# Patient Record
Sex: Female | Born: 1970
Health system: Southern US, Community
[De-identification: ages and names within clinical notes are randomized; demographics above are authoritative.]

## PROBLEM LIST (undated history)

## (undated) DIAGNOSIS — L309 Dermatitis, unspecified: Secondary | ICD-10-CM

## (undated) DIAGNOSIS — Z9889 Other specified postprocedural states: Secondary | ICD-10-CM

## (undated) DIAGNOSIS — A499 Bacterial infection, unspecified: Secondary | ICD-10-CM

## (undated) DIAGNOSIS — K449 Diaphragmatic hernia without obstruction or gangrene: Secondary | ICD-10-CM

## (undated) DIAGNOSIS — N83209 Unspecified ovarian cyst, unspecified side: Secondary | ICD-10-CM

## (undated) DIAGNOSIS — K222 Esophageal obstruction: Secondary | ICD-10-CM

## (undated) DIAGNOSIS — D649 Anemia, unspecified: Secondary | ICD-10-CM

## (undated) DIAGNOSIS — K219 Gastro-esophageal reflux disease without esophagitis: Secondary | ICD-10-CM

## (undated) DIAGNOSIS — B379 Candidiasis, unspecified: Secondary | ICD-10-CM

## (undated) DIAGNOSIS — R112 Nausea with vomiting, unspecified: Secondary | ICD-10-CM

## (undated) DIAGNOSIS — Z8619 Personal history of other infectious and parasitic diseases: Secondary | ICD-10-CM

## (undated) DIAGNOSIS — L80 Vitiligo: Secondary | ICD-10-CM

## (undated) DIAGNOSIS — R011 Cardiac murmur, unspecified: Secondary | ICD-10-CM

## (undated) DIAGNOSIS — K220 Achalasia of cardia: Secondary | ICD-10-CM

## (undated) HISTORY — DX: Diaphragmatic hernia without obstruction or gangrene: K44.9

## (undated) HISTORY — PX: UPPER GASTROINTESTINAL ENDOSCOPY: SHX188

## (undated) HISTORY — DX: Vitiligo: L80

## (undated) HISTORY — PX: ABDOMINAL HYSTERECTOMY: SHX81

## (undated) HISTORY — DX: Gastro-esophageal reflux disease without esophagitis: K21.9

## (undated) HISTORY — PX: DILATION AND CURETTAGE OF UTERUS: SHX78

## (undated) HISTORY — DX: Achalasia of cardia: K22.0

## (undated) HISTORY — DX: Unspecified ovarian cyst, unspecified side: N83.209

## (undated) HISTORY — PX: BREAST LUMPECTOMY: SHX2

## (undated) HISTORY — DX: Personal history of other infectious and parasitic diseases: Z86.19

## (undated) HISTORY — PX: TONSILLECTOMY: SUR1361

## (undated) HISTORY — PX: TUBAL LIGATION: SHX77

## (undated) HISTORY — DX: Anemia, unspecified: D64.9

## (undated) HISTORY — DX: Candidiasis, unspecified: B37.9

## (undated) HISTORY — PX: UTERINE FIBROID SURGERY: SHX826

## (undated) HISTORY — DX: Bacterial infection, unspecified: A49.9

## (undated) HISTORY — DX: Esophageal obstruction: K22.2

---

## 1999-08-03 ENCOUNTER — Other Ambulatory Visit: Admission: RE | Admit: 1999-08-03 | Discharge: 1999-08-03 | Payer: Self-pay | Admitting: Obstetrics and Gynecology

## 1999-11-24 ENCOUNTER — Inpatient Hospital Stay (HOSPITAL_COMMUNITY): Admission: AD | Admit: 1999-11-24 | Discharge: 1999-11-24 | Payer: Self-pay | Admitting: Obstetrics

## 1999-12-09 ENCOUNTER — Inpatient Hospital Stay (HOSPITAL_COMMUNITY): Admission: AD | Admit: 1999-12-09 | Discharge: 1999-12-09 | Payer: Self-pay | Admitting: Obstetrics

## 2000-02-20 ENCOUNTER — Inpatient Hospital Stay (HOSPITAL_COMMUNITY): Admission: AD | Admit: 2000-02-20 | Discharge: 2000-02-20 | Payer: Self-pay | Admitting: Obstetrics

## 2003-11-24 ENCOUNTER — Ambulatory Visit (HOSPITAL_COMMUNITY): Admission: RE | Admit: 2003-11-24 | Discharge: 2003-11-24 | Payer: Self-pay | Admitting: Obstetrics and Gynecology

## 2003-11-28 ENCOUNTER — Encounter: Admission: RE | Admit: 2003-11-28 | Discharge: 2003-11-28 | Payer: Self-pay | Admitting: Obstetrics and Gynecology

## 2004-03-18 ENCOUNTER — Encounter: Admission: RE | Admit: 2004-03-18 | Discharge: 2004-03-18 | Payer: Self-pay | Admitting: Obstetrics and Gynecology

## 2004-03-26 ENCOUNTER — Encounter: Admission: RE | Admit: 2004-03-26 | Discharge: 2004-03-26 | Payer: Self-pay | Admitting: Unknown Physician Specialty

## 2004-05-20 ENCOUNTER — Ambulatory Visit: Payer: Self-pay | Admitting: Gastroenterology

## 2004-08-02 ENCOUNTER — Encounter: Admission: RE | Admit: 2004-08-02 | Discharge: 2004-08-02 | Payer: Self-pay | Admitting: Obstetrics and Gynecology

## 2004-09-28 ENCOUNTER — Ambulatory Visit: Payer: Self-pay | Admitting: Gastroenterology

## 2004-10-11 ENCOUNTER — Ambulatory Visit: Payer: Self-pay | Admitting: Gastroenterology

## 2004-11-25 ENCOUNTER — Ambulatory Visit: Payer: Self-pay | Admitting: Gastroenterology

## 2004-12-23 ENCOUNTER — Ambulatory Visit (HOSPITAL_COMMUNITY): Admission: RE | Admit: 2004-12-23 | Discharge: 2004-12-23 | Payer: Self-pay | Admitting: Gastroenterology

## 2004-12-24 ENCOUNTER — Ambulatory Visit: Payer: Self-pay | Admitting: Gastroenterology

## 2004-12-31 ENCOUNTER — Ambulatory Visit: Payer: Self-pay | Admitting: Gastroenterology

## 2005-04-20 ENCOUNTER — Emergency Department (HOSPITAL_COMMUNITY): Admission: EM | Admit: 2005-04-20 | Discharge: 2005-04-20 | Payer: Self-pay | Admitting: Family Medicine

## 2005-05-23 ENCOUNTER — Emergency Department (HOSPITAL_COMMUNITY): Admission: EM | Admit: 2005-05-23 | Discharge: 2005-05-24 | Payer: Self-pay | Admitting: Emergency Medicine

## 2005-05-31 IMAGING — RF DG ESOPHAGUS
13 series · 19 of 19 positions shown · non-contrast
Comparison: none

CLINICAL DATA: Dysphagia for two months.  The patient has difficulty with solids and liquids. 
 BARIUM ESOPHAGRAM 
 The patient ingested a 13 mm barium tablet.  It passed from the mouth to the gastroesophageal junction without delay, but then lodged at that site and did not pass for several minutes.  There are scattered areas of small erosions in the esophagus consistent with mild diffuse esophagitis.  The barium tablet finally passed with the patient performed a Valsalva maneuver.  I do not see a discrete hiatal hernia.  There was no spontaneous gastroesophageal reflux.  However, the patient did not have a good secondary stripping wave.  The oropharyngeal swallowing mechanisms were normal.  
 IMPRESSION
 1.  Scattered small esophageal erosions consistent with diffuse mild esophagitis.  Persistent spasm in the region of the gastroesophageal junction as indicated by the delayed passage of the 13 mm barium tablet.  However, it eventually did pass without dissolving when the patient performed the Valsalva maneuver. 
 2.  Poor secondary stripping wave.

[Series 1: run · 3 of 3 slices shown (1 of 13)]
[im 1/3]
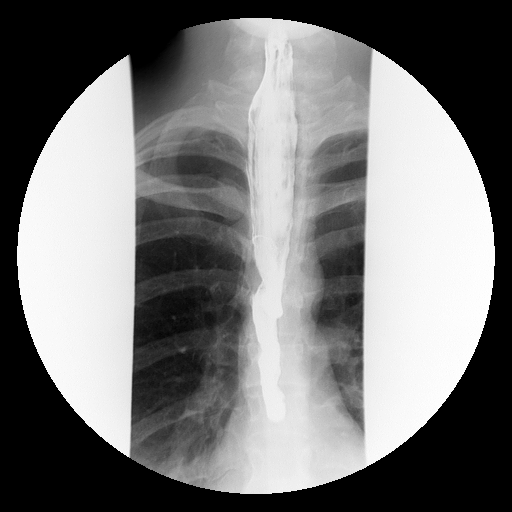
[im 2/3]
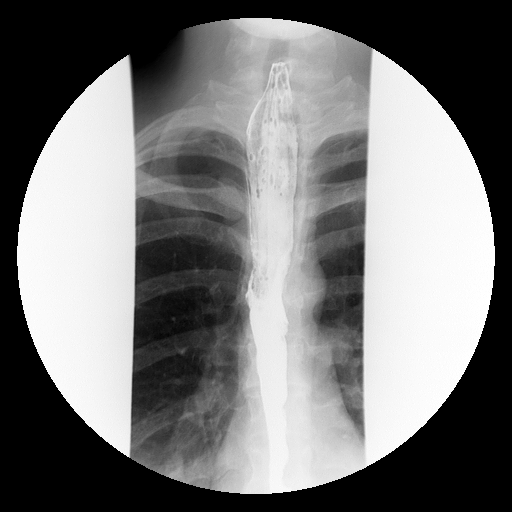
[im 3/3]
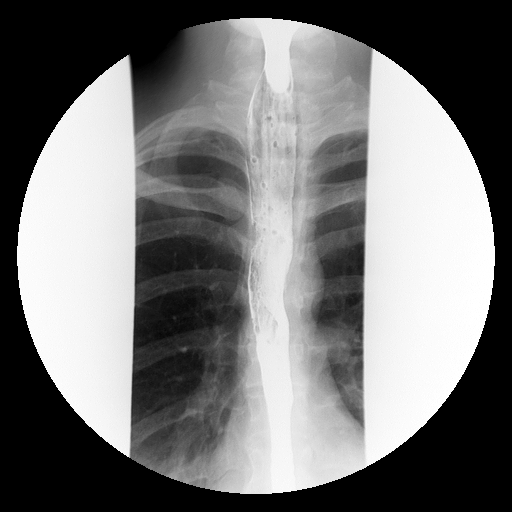

[Series 2: run · 2 of 2 slices shown (2 of 13)]
[im 1/2]
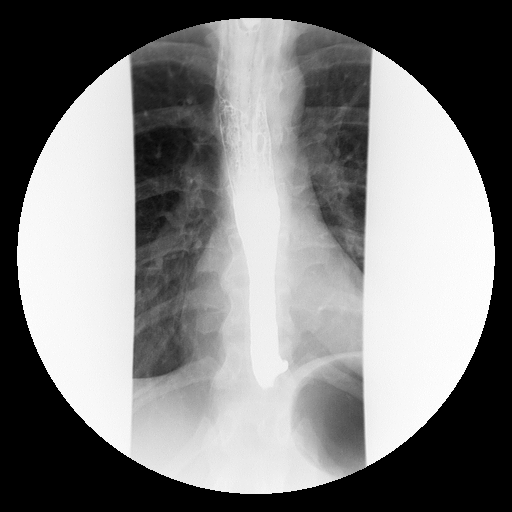
[im 2/2]
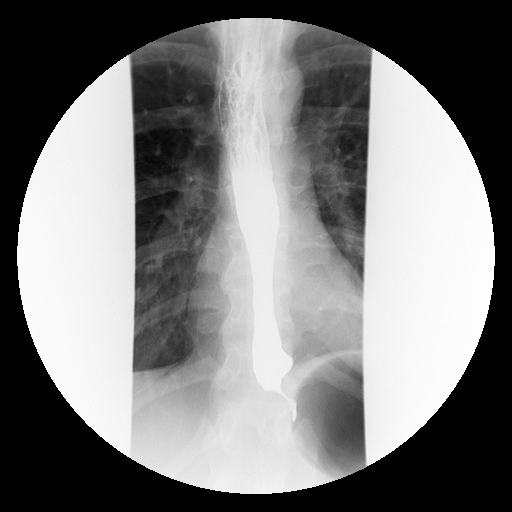

[Series 3: run · 2 of 2 slices shown (3 of 13)]
[im 1/2]
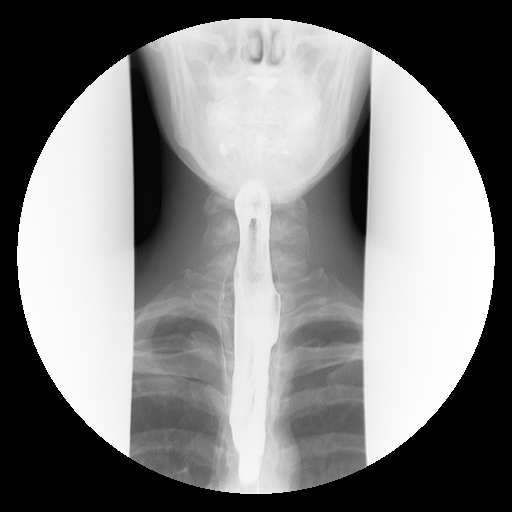
[im 2/2]
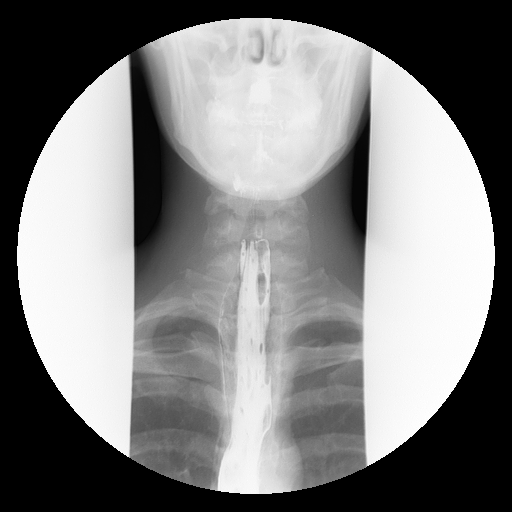

[Series 5: run · 1 of 1 slices shown (4 of 13)]
[im 1/1]
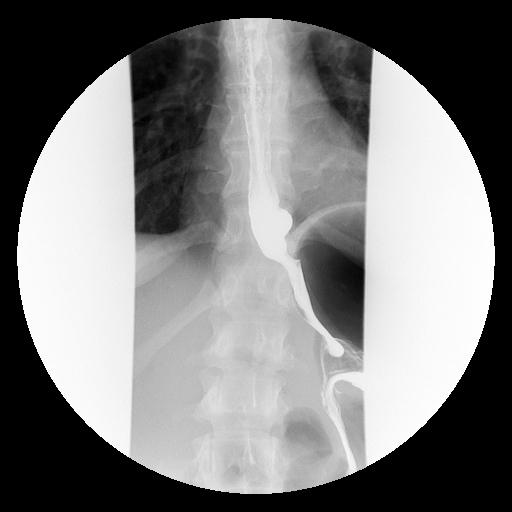

[Series 6: run · 3 of 3 slices shown (5 of 13)]
[im 1/3]
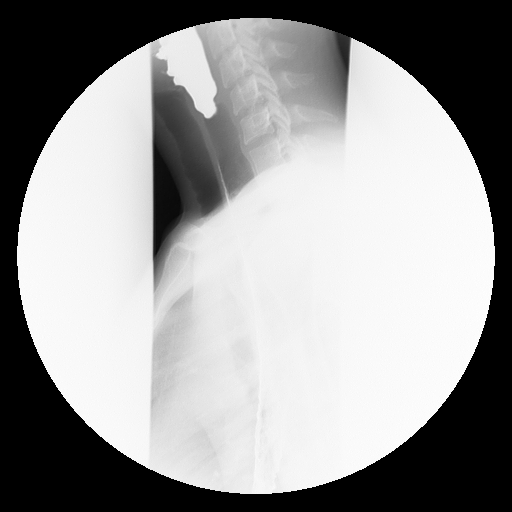
[im 2/3]
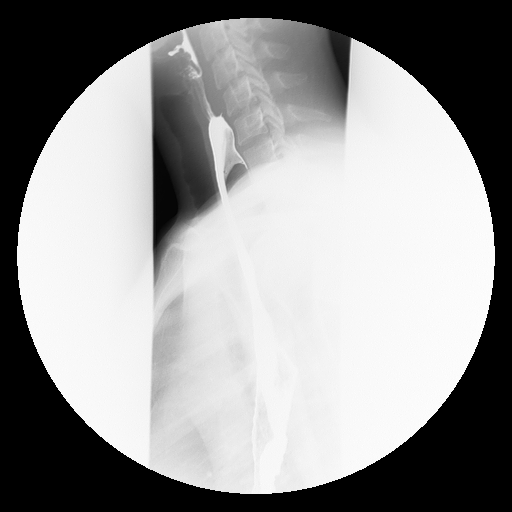
[im 3/3]
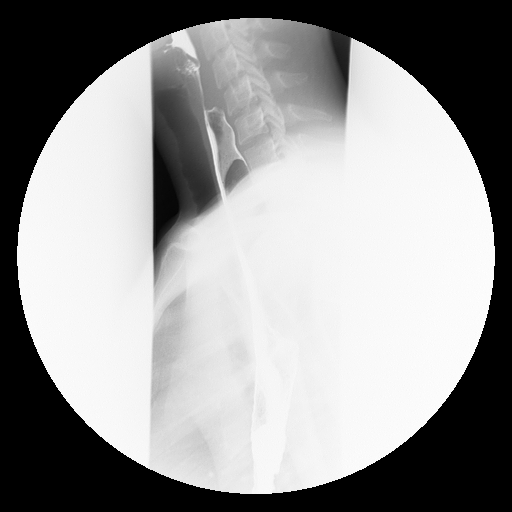

[Series 7: run · 1 of 1 slices shown (6 of 13)]
[im 1/1]
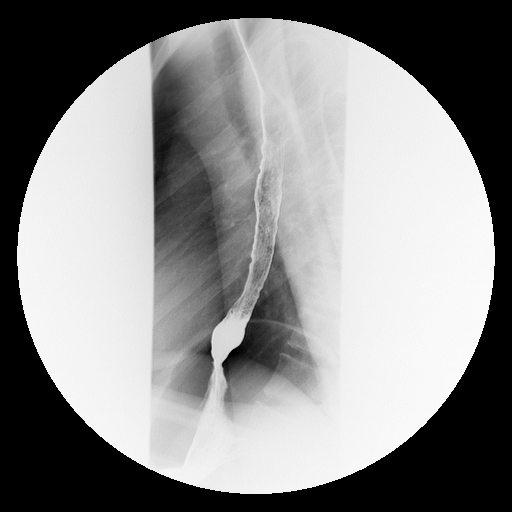

[Series 8: run · 1 of 1 slices shown (7 of 13)]
[im 1/1]
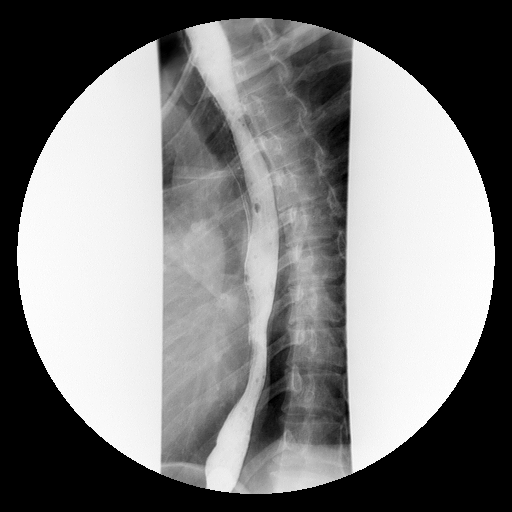

[Series 9: run · 1 of 1 slices shown (8 of 13)]
[im 1/1]
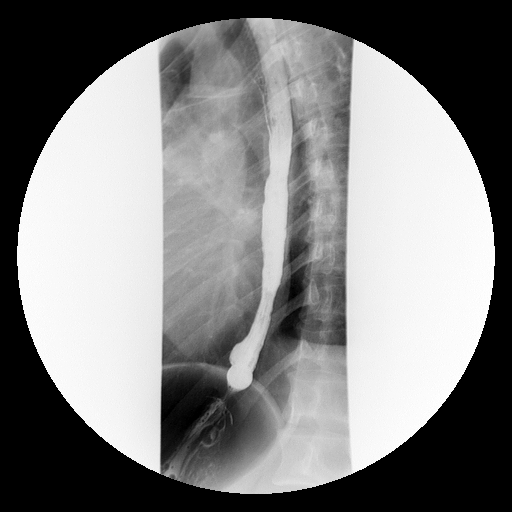

[Series 10: run · 1 of 1 slices shown (9 of 13)]
[im 1/1]
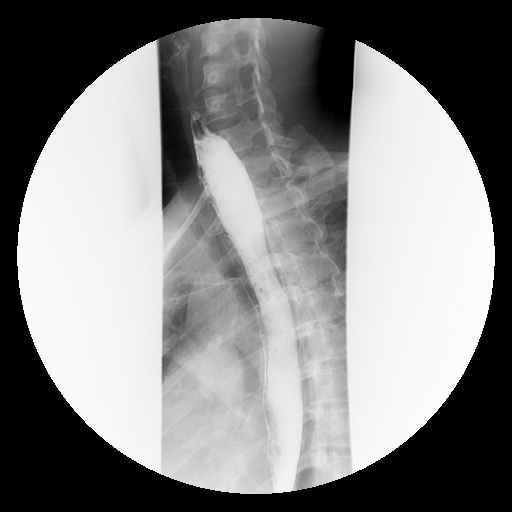

[Series 11: run · 1 of 1 slices shown (10 of 13)]
[im 1/1]
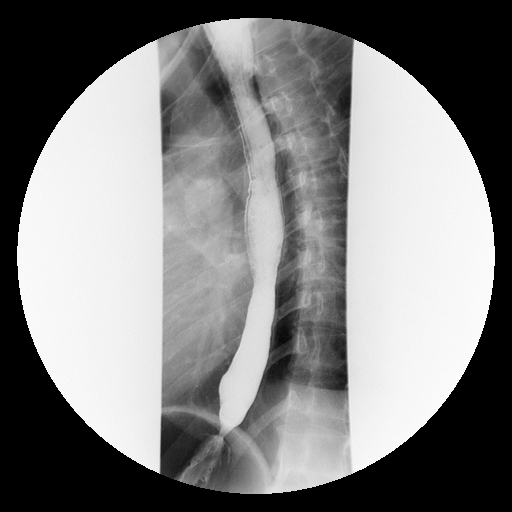

[Series 12: run · 1 of 1 slices shown (11 of 13)]
[im 1/1]
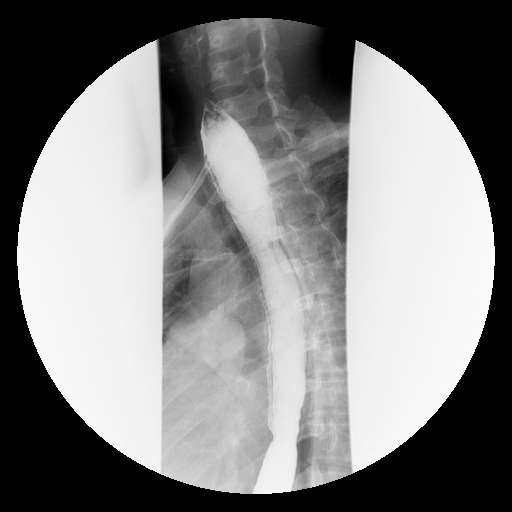

[Series 13: run · 1 of 1 slices shown (12 of 13)]
[im 1/1]
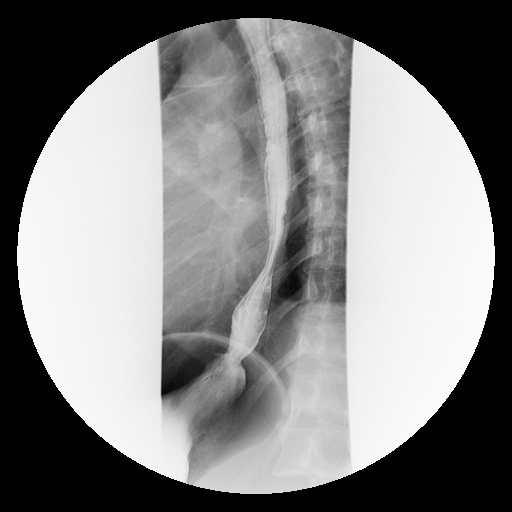

[Series 14: run · 1 of 1 slices shown (13 of 13)]
[im 1/1]
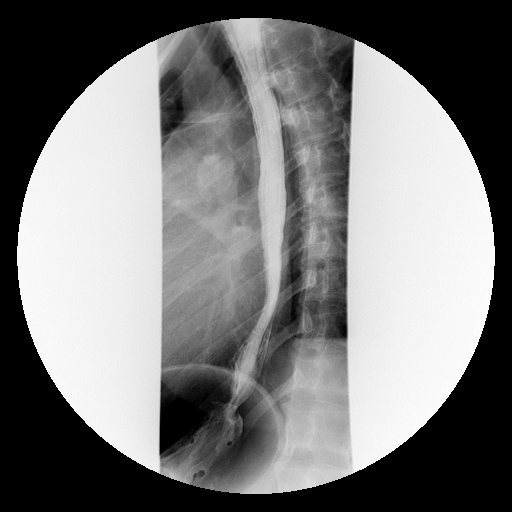

[19 of 19 positions shown; findings below may reference images not displayed]

## 2006-12-15 ENCOUNTER — Encounter: Admission: RE | Admit: 2006-12-15 | Discharge: 2006-12-15 | Payer: Self-pay | Admitting: Obstetrics and Gynecology

## 2007-03-19 ENCOUNTER — Emergency Department (HOSPITAL_COMMUNITY): Admission: EM | Admit: 2007-03-19 | Discharge: 2007-03-19 | Payer: Self-pay | Admitting: Emergency Medicine

## 2007-06-22 ENCOUNTER — Encounter: Admission: RE | Admit: 2007-06-22 | Discharge: 2007-06-22 | Payer: Self-pay | Admitting: Obstetrics and Gynecology

## 2008-04-24 ENCOUNTER — Emergency Department (HOSPITAL_COMMUNITY): Admission: EM | Admit: 2008-04-24 | Discharge: 2008-04-24 | Payer: Self-pay | Admitting: Family Medicine

## 2008-04-24 DIAGNOSIS — K222 Esophageal obstruction: Secondary | ICD-10-CM | POA: Insufficient documentation

## 2008-04-24 DIAGNOSIS — K219 Gastro-esophageal reflux disease without esophagitis: Secondary | ICD-10-CM

## 2008-04-24 DIAGNOSIS — R110 Nausea: Secondary | ICD-10-CM

## 2008-04-24 DIAGNOSIS — K5900 Constipation, unspecified: Secondary | ICD-10-CM | POA: Insufficient documentation

## 2008-04-29 ENCOUNTER — Ambulatory Visit: Payer: Self-pay | Admitting: Gastroenterology

## 2008-04-29 DIAGNOSIS — K220 Achalasia of cardia: Secondary | ICD-10-CM

## 2008-04-29 DIAGNOSIS — F411 Generalized anxiety disorder: Secondary | ICD-10-CM | POA: Insufficient documentation

## 2008-05-02 ENCOUNTER — Ambulatory Visit (HOSPITAL_COMMUNITY): Admission: RE | Admit: 2008-05-02 | Discharge: 2008-05-02 | Payer: Self-pay | Admitting: Gastroenterology

## 2008-05-06 ENCOUNTER — Telehealth: Payer: Self-pay | Admitting: Gastroenterology

## 2008-05-14 ENCOUNTER — Encounter (INDEPENDENT_AMBULATORY_CARE_PROVIDER_SITE_OTHER): Payer: Self-pay | Admitting: *Deleted

## 2009-07-30 ENCOUNTER — Emergency Department (HOSPITAL_COMMUNITY): Admission: EM | Admit: 2009-07-30 | Discharge: 2009-07-30 | Payer: Self-pay | Admitting: Family Medicine

## 2009-12-28 ENCOUNTER — Emergency Department (HOSPITAL_COMMUNITY): Admission: EM | Admit: 2009-12-28 | Discharge: 2009-12-28 | Payer: Self-pay | Admitting: Emergency Medicine

## 2010-07-20 ENCOUNTER — Encounter
Admission: RE | Admit: 2010-07-20 | Discharge: 2010-07-20 | Payer: Self-pay | Source: Home / Self Care | Attending: Family Medicine | Admitting: Family Medicine

## 2010-08-01 ENCOUNTER — Encounter: Payer: Self-pay | Admitting: Obstetrics and Gynecology

## 2010-09-26 LAB — POCT PREGNANCY, URINE: Preg Test, Ur: NEGATIVE

## 2010-09-26 LAB — URINALYSIS, ROUTINE W REFLEX MICROSCOPIC
Glucose, UA: NEGATIVE mg/dL
Hgb urine dipstick: NEGATIVE
Ketones, ur: NEGATIVE mg/dL
Protein, ur: NEGATIVE mg/dL
Urobilinogen, UA: 1 mg/dL (ref 0.0–1.0)

## 2010-09-26 LAB — WET PREP, GENITAL: WBC, Wet Prep HPF POC: NONE SEEN

## 2010-09-26 LAB — GC/CHLAMYDIA PROBE AMP, GENITAL: GC Probe Amp, Genital: NEGATIVE

## 2010-11-26 NOTE — Op Note (Signed)
Christy Collins, Christy Collins           ACCOUNT NO.:  [id number]   MEDICAL RECORD NO.:  [id number]          PATIENT TYPE:  AMB   LOCATION:  ENDO                         FACILITY:  [facility]   PHYSICIAN:  Vania Rea. Jarold Motto, M.D. [facility] OF BIRTH:  1971/04/25   DATE OF PROCEDURE:  12/31/2004  DATE OF DISCHARGE:  12/23/2004                                 OPERATIVE REPORT   PROCEDURE PERFORMED:  Esophageal manometry and 24-hour pH probe.   1.  Upper esophageal sphincter pressures appear normal.  2.  Lower esophageal sphincter pressure measures between 25 and 35 mmHg with      incomplete relaxation to swallow and with residual pressure at 12.5 mm.  3.  Motility pattern.  There were low amplitude, aperistaltic contractions      throughout the length of the esophagus noted.   ASSESSMENT:  This test is consistent with achalasia although on looking at  the manometry exam, I really do not have a good reading of the lower  esophageal sphincter entirely.   24-hour pH probe test:  The 24-hour pH probe test was completed on December 23, 2004.  Total Demeester score distally was 4.6, normal being less than 25.  There was no significant acid reflux distally or proximally in upright or  supine position.  This was a normal 24 hour pH probe test.   ASSESSMENT AND RECOMMENDATIONS:  This manometry exam shows probably  achalasia which would go along with this patient's barium swallow, failure  to respond to previous esophageal dilatations, and continued symptomatology.  I have referred her to Dr. Ninfa Linden at [facility] for second opinion  and consideration of surgical myotomy of her lower esophageal sphincter.        DRP/MEDQ  D:  12/31/2004  T:  12/31/2004  Job:  [id number]

## 2011-11-15 ENCOUNTER — Other Ambulatory Visit: Payer: Self-pay | Admitting: Family Medicine

## 2011-11-15 ENCOUNTER — Encounter: Payer: Self-pay | Admitting: Gastroenterology

## 2011-11-15 DIAGNOSIS — N6009 Solitary cyst of unspecified breast: Secondary | ICD-10-CM

## 2011-11-18 ENCOUNTER — Ambulatory Visit
Admission: RE | Admit: 2011-11-18 | Discharge: 2011-11-18 | Disposition: A | Discharge status: Home / Self Care | Payer: BC Managed Care – PPO | Source: Ambulatory Visit | Attending: Family Medicine | Admitting: Family Medicine

## 2011-11-18 DIAGNOSIS — N6009 Solitary cyst of unspecified breast: Secondary | ICD-10-CM

## 2011-12-07 ENCOUNTER — Encounter: Payer: Self-pay | Admitting: *Deleted

## 2011-12-08 ENCOUNTER — Ambulatory Visit: Payer: Self-pay | Admitting: Gastroenterology

## 2011-12-14 ENCOUNTER — Telehealth: Payer: Self-pay | Admitting: Gastroenterology

## 2011-12-14 NOTE — Telephone Encounter (Signed)
Message copied by Arna Snipe on Wed Dec 14, 2011  9:31 AM ------      Message from: Harlow Mares D      Created: Thu Dec 08, 2011 10:56 AM       Please bill patient for no show 12/08/2011

## 2012-03-28 ENCOUNTER — Emergency Department (INDEPENDENT_AMBULATORY_CARE_PROVIDER_SITE_OTHER): Payer: BC Managed Care – PPO

## 2012-03-28 ENCOUNTER — Encounter (HOSPITAL_COMMUNITY): Payer: Self-pay

## 2012-03-28 ENCOUNTER — Emergency Department (HOSPITAL_COMMUNITY)
Admission: EM | Admit: 2012-03-28 | Discharge: 2012-03-28 | Disposition: A | Discharge status: Home / Self Care | Payer: BC Managed Care – PPO | Source: Home / Self Care | Attending: Emergency Medicine | Admitting: Emergency Medicine

## 2012-03-28 DIAGNOSIS — R071 Chest pain on breathing: Secondary | ICD-10-CM

## 2012-03-28 DIAGNOSIS — R0789 Other chest pain: Secondary | ICD-10-CM

## 2012-03-28 MED ORDER — HYDROCODONE-ACETAMINOPHEN 5-500 MG PO TABS: 1.0000 | ORAL_TABLET | Freq: Four times a day (QID) | ORAL | Status: DC | PRN

## 2012-03-28 MED ORDER — MELOXICAM 7.5 MG PO TABS: 7.5000 mg | ORAL_TABLET | Freq: Every day | ORAL | Status: DC

## 2012-03-28 NOTE — ED Provider Notes (Signed)
History     CSN: [id number]  Arrival date & time 03/28/12  1843   First MD Initiated Contact with Patient 03/28/12 1850      Chief Complaint  Patient presents with  . Muscle Pain    (Consider location/radiation/quality/duration/timing/severity/associated sxs/prior treatment) HPI Comments: Patient presents urgent care complaining of anterior chest wall pain, increases with inhalation and direct palpation and movement. She describes that  she dropped a box on herself at work about 4 days ago. Has been sore since then but the pain has been getting progressively worse. He denies any cough, shortness of breath, nausea vomiting or abdominal pain.  Patient is a 41 y.o. female presenting with musculoskeletal pain. The history is provided by the patient.  Muscle Pain This is a new problem. The problem occurs constantly. The problem has not changed since onset.Associated symptoms include chest pain. Pertinent negatives include no abdominal pain, no headaches and no shortness of breath. The symptoms are aggravated by bending (Breathing). The symptoms are relieved by rest. She has tried nothing for the symptoms. The treatment provided no relief.    Past Medical History  Diagnosis Date  . Esophageal reflux   . Stricture and stenosis of esophagus   . Achalasia   . Constipation   . Noncompliance     Past Surgical History  Procedure Date  . Tonsillectomy   . Tubal ligation     No family history on file.  History  Substance Use Topics  . Smoking status: Current Every Day Smoker  . Smokeless tobacco: Not on file  . Alcohol Use: Yes    OB History    Grav Para Term Preterm Abortions TAB SAB Ect Mult Living                  Review of Systems  Constitutional: Positive for activity change. Negative for fever, chills, diaphoresis, appetite change and fatigue.  Respiratory: Negative for apnea, cough, choking, chest tightness, shortness of breath and wheezing.   Cardiovascular: Positive  for chest pain. Negative for palpitations and leg swelling.  Gastrointestinal: Negative for abdominal pain.  Musculoskeletal: Negative for back pain.  Neurological: Negative for dizziness and headaches.    Allergies  Review of patient's allergies indicates no known allergies.  Home Medications  No current outpatient prescriptions on file.  BP 127/74  Pulse 83  Temp 98.7 F (37.1 C) (Oral)  Resp 18  SpO2 100%  LMP 03/18/2012  Physical Exam  Constitutional: Vital signs are normal. She appears well-developed and well-nourished.  Non-toxic appearance. She does not have a sickly appearance. She does not appear ill. No distress. She is not intubated.  HENT:  Head: Normocephalic.  Neck: Neck supple.  Cardiovascular: Exam reveals no gallop and no friction rub.   No murmur heard. Pulmonary/Chest: Effort normal and breath sounds normal. No accessory muscle usage. No apnea. She is not intubated. No respiratory distress. She has no wheezes. She has no rales. Chest wall is not dull to percussion. She exhibits tenderness and bony tenderness. She exhibits no mass, no laceration, no crepitus, no edema, no deformity, no swelling and no retraction.    Abdominal: There is no tenderness. There is no rebound.  Skin: No rash noted. No erythema.    ED Course  Procedures (including critical care time)  Labs Reviewed - No data to display No results found.   No diagnosis found.  EKG normal sinus rhythm ventricular rate of 69 beats per minute normal PR interval and QRS duration. No  signs of ischemia acute or chronic.   MDM   muscle skeletal chest wall pain most likely diagnoses were performing a chest x-ray as patient is reporting that she had had some type of sternal injury in the past         Jimmie Molly, MD 03/28/12 1931

## 2012-03-28 NOTE — ED Notes (Signed)
C/o pain in chest area after dropping boxes on herself at work 4 days ago

## 2012-04-06 ENCOUNTER — Ambulatory Visit (INDEPENDENT_AMBULATORY_CARE_PROVIDER_SITE_OTHER): Payer: BC Managed Care – PPO | Admitting: Obstetrics and Gynecology

## 2012-04-06 ENCOUNTER — Encounter: Payer: Self-pay | Admitting: Obstetrics and Gynecology

## 2012-04-06 ENCOUNTER — Telehealth: Payer: Self-pay

## 2012-04-06 VITALS — BP 108/64 | Temp 98.8°F | Ht 63.5 in | Wt 100.0 lb

## 2012-04-06 DIAGNOSIS — N921 Excessive and frequent menstruation with irregular cycle: Secondary | ICD-10-CM

## 2012-04-06 DIAGNOSIS — N920 Excessive and frequent menstruation with regular cycle: Secondary | ICD-10-CM

## 2012-04-06 DIAGNOSIS — N946 Dysmenorrhea, unspecified: Secondary | ICD-10-CM

## 2012-04-06 DIAGNOSIS — IMO0002 Reserved for concepts with insufficient information to code with codable children: Secondary | ICD-10-CM

## 2012-04-06 LAB — THYROID PANEL WITH TSH: T4, Total: 8 ug/dL (ref 5.0–12.5)

## 2012-04-06 LAB — CBC
MCH: 26.8 pg (ref 26.0–34.0)
MCHC: 33.5 g/dL (ref 30.0–36.0)
RDW: 13.1 % (ref 11.5–15.5)

## 2012-04-06 LAB — FOLLICLE STIMULATING HORMONE: FSH: 2.5 m[IU]/mL

## 2012-04-06 NOTE — Telephone Encounter (Signed)
Message copied by Larwance Rote on Fri Apr 06, 2012  3:36 PM ------      Message from: Silverio Lay      Created: Fri Apr 06, 2012  1:14 PM      Regarding: consult report       Please send copy of progress note to referring MD

## 2012-04-06 NOTE — Patient Instructions (Signed)
Novasure

## 2012-04-06 NOTE — Progress Notes (Signed)
  Current contraception: tubal ligation. Hormone replacement therapy: No New medication: Yes   History of RUE:AVWU  History of infertility: no. History of abnormal Pap smear: yes - years ago. Last WNL History of fibroids: Yes   Increased stress: Yes   Abnormal bleeding pattern started: Pt states that cycles are very irregular, with heavy bleeding for the past 6 months. States that she is unable to estimate when she will start. Also states that during cycle she is having confusion, night sweats, and cramping. Also c/o pain with intercourse during cycle along with back pain.   Subjective:     Christy Collins is a 41 y.o. woman,No obstetric history on file., who is referred by [facility] for irregular menses.  Bleeding pattern: cycle irregular every 15-30, 8-10 days, 5 heavy days: 1 tampon+ 2 pads every 1.5 hour, clots ad 5 cm. Dysmenorrhea with pelvic pain radiating to back with onset 2 days before cycle lasting all the way to end of cycle. Intensity 5/10 improved with Ibuprofen with caffeine. 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 This heavy flow has been going for 9 months. Also c/o Deep dyspareunia for 1 year non-positional.   Denies any urinary tract symptoms, changes in bowel movements, nausea, vomiting or fever.   Current contraception: tubal ligation.  The following portions of the patient's history were reviewed and updated as appropriate: allergies, current medications, past family history, past medical history, past social history and past surgical history.  Review of Systems Pertinent items are noted in HPI.  Urinary frequency +++ in last month   Objective:    Ht 5' 3.5" (1.613 m)  Wt 100 lb (45.36 kg)  BMI 17.44 kg/m2  LMP 03/18/2012  Weight:  Wt Readings from Last 1 Encounters:  04/06/12 100 lb (45.36 kg)    BMI: Body mass index is 17.44 kg/(m^2).  General Appearance:  Alert, appropriate appearance for age. No acute distress HEENT: Grossly normal Neck / Thyroid: Supple, no masses, nodes or enlargement Lungs: clear to auscultation bilaterally Back: No CVA tenderness Cardiovascular: Regular rate and rhythm. S1, S2, no murmur Gastrointestinal: Soft, non-tender, no masses or organomegaly Pelvic Exam: Vulva and vagina appear normal. Bimanual exam reveals normal uterus and adnexa. Pelvic mass superior to bladder? Rectovaginal: normal rectal, no masses      Assessment:    Menometrorrhagia  Dysmenorrhea Plan:   CBC Thyroid panel,FSH,Prolactin Pelvic ultrasound Will follow-up with results to review treatment options   Thank you for your referral  Silverio Lay MD 04/06/2012 12:28 PM

## 2012-04-26 ENCOUNTER — Encounter: Payer: Self-pay | Admitting: Obstetrics and Gynecology

## 2012-04-26 ENCOUNTER — Ambulatory Visit (INDEPENDENT_AMBULATORY_CARE_PROVIDER_SITE_OTHER): Payer: BC Managed Care – PPO | Admitting: Obstetrics and Gynecology

## 2012-04-26 ENCOUNTER — Ambulatory Visit (INDEPENDENT_AMBULATORY_CARE_PROVIDER_SITE_OTHER): Payer: BC Managed Care – PPO

## 2012-04-26 ENCOUNTER — Other Ambulatory Visit: Payer: Self-pay | Admitting: Obstetrics and Gynecology

## 2012-04-26 VITALS — BP 110/72 | Wt 101.0 lb

## 2012-04-26 DIAGNOSIS — N925 Other specified irregular menstruation: Secondary | ICD-10-CM

## 2012-04-26 DIAGNOSIS — N949 Unspecified condition associated with female genital organs and menstrual cycle: Secondary | ICD-10-CM

## 2012-04-26 DIAGNOSIS — N921 Excessive and frequent menstruation with irregular cycle: Secondary | ICD-10-CM

## 2012-04-26 DIAGNOSIS — N858 Other specified noninflammatory disorders of uterus: Secondary | ICD-10-CM

## 2012-04-26 DIAGNOSIS — N938 Other specified abnormal uterine and vaginal bleeding: Secondary | ICD-10-CM

## 2012-04-26 DIAGNOSIS — N920 Excessive and frequent menstruation with regular cycle: Secondary | ICD-10-CM

## 2012-04-26 DIAGNOSIS — N9489 Other specified conditions associated with female genital organs and menstrual cycle: Secondary | ICD-10-CM

## 2012-04-26 NOTE — Patient Instructions (Signed)
Patient Education Materials to be provided at check out (*indicates is located in accordion folder):  Hysteroscopy

## 2012-04-26 NOTE — Progress Notes (Signed)
Subjective:    Christy Collins is a 41 y.o. female, [phone], who presents for Gyn ultrasound because of DUB and pelvic pain.  Was seen 04/06/12: Pt states that cycles are very irregular, with heavy bleeding for the past 6 months. States that she is unable to estimate when she will start. Also states that during cycle she is having confusion, night sweats, and cramping. Also c/o pain with intercourse during cycle along with back pain  The following portions of the patient's history were reviewed and updated as appropriate: allergies, current medications, past family history.  Objective:    BP 110/72  Wt 101 lb (45.813 kg)  LMP 04/14/2012    Weight:  Wt Readings from Last 1 Encounters:  04/26/12 101 lb (45.813 kg)          BMI: There is no height on file to calculate BMI.  ULTRASOUND: Uterus AV    Adnexa WNL    Endometrium 1.13 mm    Free fluid: no    Other findings:  A solid mass is identified within the uterine cavity.  There is also a small amount of endocavity fluid.  Measure 2.4 x 2.2 x 2.5 cm   Assessment:    Dysfunctional uterine bleeding Possible submucosal fibroid    Plan:    Suggest Hysteroscopy with resection of fibroid. The procedure was reviewed with patient with possible benefits and risks including but not limited to bleeding, infection, uterine perforation with possible intra-abdominal organ damage and need to stay overnight +/- require exploratory laparoscopy.  Patient desires to proceed with Novasure as well. Rare risk of Post-Novasure syndrome reviewed. Will schedule  Silverio Lay MD

## 2012-04-26 NOTE — H&P (Signed)
   Reason for admission:   DUB with submucosal fibroid History:     Christy Collins is a 41 y.o. female, [phone], presenting today to undergo HSC/Resection of fibroid/endometrial ablation due to DUB with submucosal and pedunculated fibroid  Review of system:  Non-contributory   Past Medical History:  GERD Hiatal hernia esophageal stenosis BTL S/P breast lumpectomy  Allergies:  NKDA  Social History:   Married and non-smoker  Family History:   non-contributory  Physical exam:    General Appearance: Alert, appropriate appearance for age. No acute distress Neck / Thyroid: Supple, no masses, nodes or enlargement Lungs: clear to auscultation bilaterally Back: No CVA tenderness. Cardiovascular: Regular rate and rhythm. S1, S2, no murmur Gastrointestinal: Soft, non-tender, no masses or organomegaly Pelvic Exam: Vulva and vagina appear normal. Bimanual exam reveals normal uterus and adnexa.  ULTRASOUND on 04/26/12 Uterus AV  Adnexa WNL  Endometrium 1.13 mm  Free fluid: no  Other findings: A solid mass is identified within the uterine cavity. There is also a small amount of endocavity fluid. Measure 2.4 x 2.2 x 2.5 cm    Assessment:   DUB with submucosal fibroid   Plan:    Proceed with HSC/Resection/Novasure.  HYSTEROSCOPY   The procedure was reviewed with patient with expected benefits.  Risks including but not limited to bleeding, infection, uterine perforation with possible intra-abdominal organ damage and need to stay overnight +/- require exploratory laparoscopy were also reviewed.  Pre-operative instructions were given to patient.  Post-operative recovery and expectations were also discussed and all questions were answered.      Silverio Lay A MD

## 2012-05-15 ENCOUNTER — Telehealth: Payer: Self-pay | Admitting: Obstetrics and Gynecology

## 2012-05-15 NOTE — Telephone Encounter (Signed)
Hysteroscopy; Resection of fibroid with Versapoint; Novasure scheduled for 06/04/12 @ 9:30 with SR. [employer] effective 07/12/11.  Plan pays 80/20 after a $400 deductible.Pre-op due $164.00  -Adrianne Pridgen

## 2012-05-21 ENCOUNTER — Encounter (HOSPITAL_COMMUNITY): Payer: Self-pay | Admitting: Pharmacist

## 2012-05-22 ENCOUNTER — Other Ambulatory Visit: Payer: Self-pay | Admitting: Obstetrics and Gynecology

## 2012-06-03 MED ORDER — DEXTROSE 5 % IV SOLN
2.0000 g | INTRAVENOUS | Status: DC
  Filled 2012-06-03: qty 2

## 2012-06-04 ENCOUNTER — Ambulatory Visit (HOSPITAL_COMMUNITY): Payer: BC Managed Care – PPO | Admitting: Anesthesiology

## 2012-06-04 ENCOUNTER — Encounter (HOSPITAL_COMMUNITY): Payer: Self-pay | Admitting: Anesthesiology

## 2012-06-04 ENCOUNTER — Encounter (HOSPITAL_COMMUNITY)
Admission: RE | Disposition: A | Discharge status: Home / Self Care | Payer: Self-pay | Source: Ambulatory Visit | Attending: Obstetrics and Gynecology

## 2012-06-04 ENCOUNTER — Ambulatory Visit (HOSPITAL_COMMUNITY)
Admission: RE | Admit: 2012-06-04 | Discharge: 2012-06-04 | Disposition: A | Discharge status: Home / Self Care | Payer: BC Managed Care – PPO | Source: Ambulatory Visit | Attending: Obstetrics and Gynecology | Admitting: Obstetrics and Gynecology

## 2012-06-04 DIAGNOSIS — Z538 Procedure and treatment not carried out for other reasons: Secondary | ICD-10-CM | POA: Insufficient documentation

## 2012-06-04 DIAGNOSIS — N920 Excessive and frequent menstruation with regular cycle: Secondary | ICD-10-CM | POA: Insufficient documentation

## 2012-06-04 LAB — COMPREHENSIVE METABOLIC PANEL
Albumin: 3.5 g/dL (ref 3.5–5.2)
BUN: 11 mg/dL (ref 6–23)
Creatinine, Ser: 0.91 mg/dL (ref 0.50–1.10)
GFR calc Af Amer: >90 mL/min (ref >90)
Glucose, Bld: 88 mg/dL (ref 70–99)
Total Bilirubin: 0.3 mg/dL (ref 0.3–1.2)
Total Protein: 6.7 g/dL (ref 6.0–8.3)

## 2012-06-04 LAB — CBC
HCT: 36.4 % (ref 36.0–46.0)
MCHC: 33 g/dL (ref 30.0–36.0)
MCV: 82.5 fL (ref 78.0–100.0)
RDW: 14.5 % (ref 11.5–15.5)

## 2012-06-04 LAB — PREGNANCY, URINE: Preg Test, Ur: NEGATIVE

## 2012-06-04 SURGERY — DILATATION & CURETTAGE/HYSTEROSCOPY WITH VERSAPOINT RESECTION
Anesthesia: General

## 2012-06-04 MED ORDER — DEXAMETHASONE SODIUM PHOSPHATE 10 MG/ML IJ SOLN
INTRAMUSCULAR | Status: AC
  Filled 2012-06-04: qty 1

## 2012-06-04 MED ORDER — LACTATED RINGERS IV SOLN
INTRAVENOUS | Status: DC
  Administered 2012-06-04: 09:00:00 via INTRAVENOUS

## 2012-06-04 MED ORDER — ONDANSETRON HCL 4 MG/2ML IJ SOLN
INTRAMUSCULAR | Status: AC
  Filled 2012-06-04: qty 2

## 2012-06-04 MED ORDER — DEXTROSE 5 % IV SOLN: 1.0000 g | INTRAVENOUS | Status: DC

## 2012-06-04 MED ORDER — SCOPOLAMINE 1 MG/3DAYS TD PT72
1.0000 | MEDICATED_PATCH | TRANSDERMAL | Status: DC
  Administered 2012-06-04: 1.5 mg via TRANSDERMAL

## 2012-06-04 MED ORDER — FENTANYL CITRATE 0.05 MG/ML IJ SOLN
INTRAMUSCULAR | Status: AC
  Filled 2012-06-04: qty 2

## 2012-06-04 MED ORDER — HYDROCODONE-ACETAMINOPHEN 5-500 MG PO TABS: 1.0000 | ORAL_TABLET | Freq: Four times a day (QID) | ORAL | Status: DC | PRN

## 2012-06-04 MED ORDER — CHLOROPROCAINE HCL 1 % IJ SOLN
INTRAMUSCULAR | Status: AC
  Filled 2012-06-04: qty 30

## 2012-06-04 MED ORDER — FLUCONAZOLE 150 MG PO TABS: 150.0000 mg | ORAL_TABLET | Freq: Once | ORAL | Status: DC

## 2012-06-04 MED ORDER — NORETHINDRONE ACETATE 5 MG PO TABS: 5.0000 mg | ORAL_TABLET | Freq: Every day | ORAL | Status: DC

## 2012-06-04 MED ORDER — SCOPOLAMINE 1 MG/3DAYS TD PT72
MEDICATED_PATCH | TRANSDERMAL | Status: AC
  Administered 2012-06-04: 1.5 mg via TRANSDERMAL
  Filled 2012-06-04: qty 1

## 2012-06-04 MED ORDER — PROPOFOL 10 MG/ML IV EMUL
INTRAVENOUS | Status: AC
  Filled 2012-06-04: qty 20

## 2012-06-04 MED ORDER — MIDAZOLAM HCL 2 MG/2ML IJ SOLN
INTRAMUSCULAR | Status: AC
  Filled 2012-06-04: qty 2

## 2012-06-04 MED ORDER — IBUPROFEN 600 MG PO TABS: 600.0000 mg | ORAL_TABLET | Freq: Four times a day (QID) | ORAL | Status: DC | PRN

## 2012-06-04 MED ORDER — LIDOCAINE HCL (CARDIAC) 20 MG/ML IV SOLN
INTRAVENOUS | Status: AC
  Filled 2012-06-04: qty 5

## 2012-06-04 MED ORDER — DEXAMETHASONE SODIUM PHOSPHATE 4 MG/ML IJ SOLN
INTRAMUSCULAR | Status: DC | PRN
  Administered 2012-06-04: 10 mg via INTRAVENOUS

## 2012-06-04 SURGICAL SUPPLY — 28 items
ABLATOR ENDOMETRIAL BIPOLAR (ABLATOR) ×1 IMPLANT
BOOTIES KNEE HIGH SLOAN (MISCELLANEOUS) ×2 IMPLANT
CANISTER SUCTION 2500CC (MISCELLANEOUS) ×1 IMPLANT
CATH ROBINSON RED A/P 16FR (CATHETERS) ×1 IMPLANT
CLOTH BEACON ORANGE TIMEOUT ST (SAFETY) ×1 IMPLANT
CONTAINER PREFILL 10% NBF 60ML (FORM) ×2 IMPLANT
CORD ACTIVE DISPOSABLE (ELECTRODE)
CORD ELECTRO ACTIVE DISP (ELECTRODE) IMPLANT
DRESSING TELFA 8X3 (GAUZE/BANDAGES/DRESSINGS) ×1 IMPLANT
ELECT LOOP GYNE PRO 24FR (CUTTING LOOP)
ELECT REM PT RETURN 9FT ADLT (ELECTROSURGICAL)
ELECT VAPORTRODE GRVD BAR (ELECTRODE) IMPLANT
ELECTRODE LOOP GYNE PRO 24FR (CUTTING LOOP) IMPLANT
ELECTRODE REM PT RTRN 9FT ADLT (ELECTROSURGICAL) ×1 IMPLANT
ELECTRODE ROLLER VERSAPOINT (ELECTRODE) IMPLANT
ELECTRODE RT ANGLE VERSAPOINT (CUTTING LOOP) IMPLANT
GLOVE BIOGEL PI IND STRL 7.0 (GLOVE) ×2 IMPLANT
GLOVE BIOGEL PI INDICATOR 7.0 (GLOVE)
GOWN PREVENTION PLUS LG XLONG (DISPOSABLE) ×2 IMPLANT
GOWN STRL REIN XL XLG (GOWN DISPOSABLE) ×2 IMPLANT
NDL SPNL 22GX3.5 QUINCKE BK (NEEDLE) ×1 IMPLANT
NEEDLE SPNL 22GX3.5 QUINCKE BK (NEEDLE) IMPLANT
PACK HYSTEROSCOPY LF (CUSTOM PROCEDURE TRAY) ×1 IMPLANT
PACK VAGINAL MINOR WOMEN LF (CUSTOM PROCEDURE TRAY) ×1 IMPLANT
PAD OB MATERNITY 4.3X12.25 (PERSONAL CARE ITEMS) ×1 IMPLANT
PAD PREP 24X48 CUFFED NSTRL (MISCELLANEOUS) IMPLANT
TOWEL OR 17X24 6PK STRL BLUE (TOWEL DISPOSABLE) ×1 IMPLANT
WATER STERILE IRR 1000ML POUR (IV SOLUTION) ×1 IMPLANT

## 2012-06-04 NOTE — Anesthesia Postprocedure Evaluation (Signed)
Case Cancelled. Assisting anesthesia in closing encounter.

## 2012-06-04 NOTE — Transfer of Care (Signed)
Case Cancelled

## 2012-06-04 NOTE — Anesthesia Preprocedure Evaluation (Addendum)
Anesthesia Evaluation  Patient identified by MRN, date of birth, ID band Patient awake    Reviewed: Allergy & Precautions, H&P , NPO status , Patient's Chart, lab work & pertinent test results  History of Anesthesia Complications (+) PONV  Airway Mallampati: II TM Distance: >3 FB Neck ROM: full    Dental No notable dental hx. (+) Teeth Intact   Pulmonary neg pulmonary ROS,    Pulmonary exam normal       Cardiovascular negative cardio ROS  Rhythm:regular Rate:Normal     Neuro/Psych  Headaches, negative psych ROS   GI/Hepatic hiatal hernia, GERD-  Medicated and Controlled,Achalsia Hx/o Esophageal Stricture   Endo/Other  negative endocrine ROS  Renal/GU negative Renal ROS  negative genitourinary   Musculoskeletal   Abdominal Normal abdominal exam  (+)   Peds  Hematology   Anesthesia Other Findings   Reproductive/Obstetrics                          Anesthesia Physical Anesthesia Plan  ASA: II  Anesthesia Plan: General LMA   Post-op Pain Management:    Induction:   Airway Management Planned:   Additional Equipment:   Intra-op Plan:   Post-operative Plan:   Informed Consent: I have reviewed the patients History and Physical, chart, labs and discussed the procedure including the risks, benefits and alternatives for the proposed anesthesia with the patient or authorized representative who has indicated his/her understanding and acceptance.   Dental Advisory Given  Plan Discussed with: Anesthesiologist, CRNA and Surgeon  Anesthesia Plan Comments:         Anesthesia Quick Evaluation

## 2012-06-04 NOTE — Progress Notes (Signed)
Patient informs me at pre-op in short stay that she started her period 3 days ago and is bleeding heavily with clots. Decision is made to cancel procedure and reschedule for 06/08/12 at 9:00 am. Instructions reviewed with patient and husband. Prescription for Aygestin 5 mg daily to start tonight. Per patient request: Diflucan also prescribed due to having received Cefotetan

## 2012-06-06 ENCOUNTER — Other Ambulatory Visit: Payer: Self-pay | Admitting: Obstetrics and Gynecology

## 2012-06-06 NOTE — H&P (Signed)
   Reason for admission:   Menorrhagia with submucosal fibroid History:    Christy Collins is a 41 y.o. female, [id number], who presents for hysteroscopy with endometrial ablation after removal of sub-mucosal fibroid  Was seen 04/06/12: Pt states that cycles are very irregular, with heavy bleeding for the past 6 months. States that she is unable to estimate when she will start. Also states that during cycle she is having confusion, night sweats, and cramping. 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 No acute distress Neck / Thyroid: Supple, no masses, nodes or enlargement Lungs: clear to auscultation bilaterally Back: No CVA tenderness. Cardiovascular: Regular rate and rhythm. S1, S2, no murmur Gastrointestinal: Soft, non-tender, no masses or organomegaly Pelvic Exam: VV normal. Cervix normal. Uterus slightly enlarged but NT. Adnexa normal  ULTRASOUND: Uterus AV  Adnexa WNL  Endometrium 1.13 mm  Free fluid: no  Other findings: A solid mass is identified within the uterine cavity. There is also a small amount of endocavity fluid. Measure 2.4 x 2.2 x 2.5 cm     Assessment:   Menorrhagia with sub-mucosal fibroid   Plan:    Proceed with HSC / D&C/ resection of fibroid / Novasure ablation. Procedure, risks and benefits reviewed with patient including but not limited to bleeding, infection, injury to uterus and subsequently to intra-abdominal organs. Patient voices understanding and is agreeable to proceed.  

## 2012-06-07 MED ORDER — DEXTROSE 5 % IV SOLN
2.0000 g | INTRAVENOUS | Status: AC
  Administered 2012-06-08: 2 g via INTRAVENOUS
  Filled 2012-06-07: qty 2

## 2012-06-08 ENCOUNTER — Encounter (HOSPITAL_COMMUNITY)
Admission: RE | Disposition: A | Discharge status: Home / Self Care | Payer: Self-pay | Source: Ambulatory Visit | Attending: Obstetrics and Gynecology

## 2012-06-08 ENCOUNTER — Encounter (HOSPITAL_COMMUNITY): Payer: Self-pay | Admitting: Anesthesiology

## 2012-06-08 ENCOUNTER — Ambulatory Visit (HOSPITAL_COMMUNITY): Payer: BC Managed Care – PPO | Admitting: Anesthesiology

## 2012-06-08 ENCOUNTER — Ambulatory Visit (HOSPITAL_COMMUNITY)
Admission: RE | Admit: 2012-06-08 | Discharge: 2012-06-08 | Disposition: A | Discharge status: Home / Self Care | Payer: BC Managed Care – PPO | Source: Ambulatory Visit | Attending: Obstetrics and Gynecology | Admitting: Obstetrics and Gynecology

## 2012-06-08 DIAGNOSIS — D250 Submucous leiomyoma of uterus: Secondary | ICD-10-CM | POA: Insufficient documentation

## 2012-06-08 DIAGNOSIS — N920 Excessive and frequent menstruation with regular cycle: Secondary | ICD-10-CM

## 2012-06-08 HISTORY — PX: DILITATION & CURRETTAGE/HYSTROSCOPY WITH VERSAPOINT RESECTION: SHX5571

## 2012-06-08 SURGERY — DILATATION & CURETTAGE/HYSTEROSCOPY WITH VERSAPOINT RESECTION
Anesthesia: General | Site: Uterus | Wound class: Clean Contaminated

## 2012-06-08 MED ORDER — DEXAMETHASONE SODIUM PHOSPHATE 4 MG/ML IJ SOLN
INTRAMUSCULAR | Status: DC | PRN
  Administered 2012-06-08: 10 mg via INTRAVENOUS

## 2012-06-08 MED ORDER — KETOROLAC TROMETHAMINE 30 MG/ML IJ SOLN
INTRAMUSCULAR | Status: DC | PRN
  Administered 2012-06-08: 30 mg via INTRAVENOUS

## 2012-06-08 MED ORDER — CHLOROPROCAINE HCL 1 % IJ SOLN
INTRAMUSCULAR | Status: DC | PRN
  Administered 2012-06-08: 20 mL

## 2012-06-08 MED ORDER — LACTATED RINGERS IV SOLN
INTRAVENOUS | Status: DC
  Administered 2012-06-08: 08:00:00 via INTRAVENOUS

## 2012-06-08 MED ORDER — METOCLOPRAMIDE HCL 5 MG/ML IJ SOLN
10.0000 mg | Freq: Once | INTRAMUSCULAR | Status: AC | PRN
  Administered 2012-06-08: 10 mg via INTRAVENOUS

## 2012-06-08 MED ORDER — DEXAMETHASONE SODIUM PHOSPHATE 10 MG/ML IJ SOLN
INTRAMUSCULAR | Status: AC
  Filled 2012-06-08: qty 1

## 2012-06-08 MED ORDER — PHENYLEPHRINE 40 MCG/ML (10ML) SYRINGE FOR IV PUSH (FOR BLOOD PRESSURE SUPPORT)
PREFILLED_SYRINGE | INTRAVENOUS | Status: AC
  Filled 2012-06-08: qty 5

## 2012-06-08 MED ORDER — FENTANYL CITRATE 0.05 MG/ML IJ SOLN
INTRAMUSCULAR | Status: AC
  Filled 2012-06-08: qty 2

## 2012-06-08 MED ORDER — LIDOCAINE HCL (CARDIAC) 20 MG/ML IV SOLN
INTRAVENOUS | Status: AC
  Filled 2012-06-08: qty 5

## 2012-06-08 MED ORDER — PANTOPRAZOLE SODIUM 40 MG PO TBEC
40.0000 mg | DELAYED_RELEASE_TABLET | Freq: Once | ORAL | Status: AC
  Administered 2012-06-08: 40 mg via ORAL

## 2012-06-08 MED ORDER — MEPERIDINE HCL 25 MG/ML IJ SOLN: 6.2500 mg | INTRAMUSCULAR | Status: DC | PRN

## 2012-06-08 MED ORDER — FENTANYL CITRATE 0.05 MG/ML IJ SOLN: 25.0000 ug | INTRAMUSCULAR | Status: DC | PRN

## 2012-06-08 MED ORDER — MIDAZOLAM HCL 5 MG/5ML IJ SOLN
INTRAMUSCULAR | Status: DC | PRN
  Administered 2012-06-08: 1 mg via INTRAVENOUS

## 2012-06-08 MED ORDER — METOCLOPRAMIDE HCL 5 MG/ML IJ SOLN
INTRAMUSCULAR | Status: AC
  Administered 2012-06-08: 10 mg via INTRAVENOUS
  Filled 2012-06-08: qty 2

## 2012-06-08 MED ORDER — MIDAZOLAM HCL 2 MG/2ML IJ SOLN
INTRAMUSCULAR | Status: AC
  Filled 2012-06-08: qty 2

## 2012-06-08 MED ORDER — ONDANSETRON HCL 4 MG/2ML IJ SOLN
INTRAMUSCULAR | Status: AC
  Filled 2012-06-08: qty 2

## 2012-06-08 MED ORDER — SCOPOLAMINE 1 MG/3DAYS TD PT72
MEDICATED_PATCH | TRANSDERMAL | Status: AC
  Administered 2012-06-08: 1.5 mg via TRANSDERMAL
  Filled 2012-06-08: qty 1

## 2012-06-08 MED ORDER — PHENYLEPHRINE HCL 10 MG/ML IJ SOLN
INTRAMUSCULAR | Status: DC | PRN
  Administered 2012-06-08: 120 ug via INTRAVENOUS
  Administered 2012-06-08 (×2): 80 ug via INTRAVENOUS
  Administered 2012-06-08: 120 ug via INTRAVENOUS

## 2012-06-08 MED ORDER — METOCLOPRAMIDE HCL 5 MG/ML IJ SOLN: 10.0000 mg | Freq: Once | INTRAMUSCULAR | Status: DC | PRN

## 2012-06-08 MED ORDER — LIDOCAINE HCL (CARDIAC) 20 MG/ML IV SOLN
INTRAVENOUS | Status: DC | PRN
  Administered 2012-06-08: 20 mg via INTRAVENOUS
  Administered 2012-06-08: 30 mg via INTRAVENOUS

## 2012-06-08 MED ORDER — CHLOROPROCAINE HCL 1 % IJ SOLN
INTRAMUSCULAR | Status: AC
  Filled 2012-06-08: qty 30

## 2012-06-08 MED ORDER — SODIUM CHLORIDE 0.9 % IR SOLN
Status: DC | PRN
  Administered 2012-06-08 (×2): 3000 mL

## 2012-06-08 MED ORDER — PANTOPRAZOLE SODIUM 40 MG PO TBEC
DELAYED_RELEASE_TABLET | ORAL | Status: AC
  Filled 2012-06-08: qty 1

## 2012-06-08 MED ORDER — LACTATED RINGERS IV SOLN
INTRAVENOUS | Status: DC
  Administered 2012-06-08 (×3): via INTRAVENOUS

## 2012-06-08 MED ORDER — LACTATED RINGERS IR SOLN
Status: DC | PRN
  Administered 2012-06-08: 1

## 2012-06-08 MED ORDER — ONDANSETRON HCL 4 MG/2ML IJ SOLN
INTRAMUSCULAR | Status: DC | PRN
  Administered 2012-06-08: 4 mg via INTRAVENOUS

## 2012-06-08 MED ORDER — FENTANYL CITRATE 0.05 MG/ML IJ SOLN
INTRAMUSCULAR | Status: AC
  Administered 2012-06-08: 50 ug via INTRAVENOUS
  Filled 2012-06-08: qty 2

## 2012-06-08 MED ORDER — FENTANYL CITRATE 0.05 MG/ML IJ SOLN
25.0000 ug | INTRAMUSCULAR | Status: DC | PRN
  Administered 2012-06-08: 50 ug via INTRAVENOUS

## 2012-06-08 MED ORDER — SCOPOLAMINE 1 MG/3DAYS TD PT72
1.0000 | MEDICATED_PATCH | Freq: Once | TRANSDERMAL | Status: DC
  Administered 2012-06-08: 1.5 mg via TRANSDERMAL

## 2012-06-08 MED ORDER — FENTANYL CITRATE 0.05 MG/ML IJ SOLN
INTRAMUSCULAR | Status: DC | PRN
  Administered 2012-06-08 (×2): 50 ug via INTRAVENOUS

## 2012-06-08 MED ORDER — PROPOFOL 10 MG/ML IV EMUL
INTRAVENOUS | Status: AC
  Filled 2012-06-08: qty 20

## 2012-06-08 MED ORDER — KETOROLAC TROMETHAMINE 30 MG/ML IJ SOLN
INTRAMUSCULAR | Status: AC
  Filled 2012-06-08: qty 1

## 2012-06-08 MED ORDER — FLUCONAZOLE 150 MG PO TABS: 150.0000 mg | ORAL_TABLET | Freq: Once | ORAL | Status: DC

## 2012-06-08 MED ORDER — PROPOFOL 10 MG/ML IV EMUL
INTRAVENOUS | Status: DC | PRN
  Administered 2012-06-08: 150 mg via INTRAVENOUS
  Administered 2012-06-08: 20 mg via INTRAVENOUS

## 2012-06-08 SURGICAL SUPPLY — 22 items
BOOTIES KNEE HIGH SLOAN (MISCELLANEOUS) ×4 IMPLANT
CANISTER SUCTION 2500CC (MISCELLANEOUS) ×8 IMPLANT
CATH ROBINSON RED A/P 16FR (CATHETERS) ×2 IMPLANT
CLOTH BEACON ORANGE TIMEOUT ST (SAFETY) ×2 IMPLANT
CONTAINER PREFILL 10% NBF 60ML (FORM) ×3 IMPLANT
CORD ACTIVE DISPOSABLE (ELECTRODE)
CORD ELECTRO ACTIVE DISP (ELECTRODE) IMPLANT
DRESSING TELFA 8X3 (GAUZE/BANDAGES/DRESSINGS) ×2 IMPLANT
ELECT LOOP GYNE PRO 24FR (CUTTING LOOP)
ELECT REM PT RETURN 9FT ADLT (ELECTROSURGICAL) ×2
ELECT VAPORTRODE GRVD BAR (ELECTRODE) IMPLANT
ELECTRODE LOOP GYNE PRO 24FR (CUTTING LOOP) IMPLANT
ELECTRODE REM PT RTRN 9FT ADLT (ELECTROSURGICAL) ×1 IMPLANT
ELECTRODE ROLLER VERSAPOINT (ELECTRODE) IMPLANT
ELECTRODE RT ANGLE VERSAPOINT (CUTTING LOOP) ×1 IMPLANT
GLOVE BIOGEL PI IND STRL 7.0 (GLOVE) ×2 IMPLANT
GLOVE BIOGEL PI INDICATOR 7.0 (GLOVE) ×2
GOWN STRL REIN XL XLG (GOWN DISPOSABLE) ×4 IMPLANT
PACK HYSTEROSCOPY LF (CUSTOM PROCEDURE TRAY) ×2 IMPLANT
PAD OB MATERNITY 4.3X12.25 (PERSONAL CARE ITEMS) ×2 IMPLANT
TOWEL OR 17X24 6PK STRL BLUE (TOWEL DISPOSABLE) ×2 IMPLANT
WATER STERILE IRR 1000ML POUR (IV SOLUTION) ×2 IMPLANT

## 2012-06-08 NOTE — Addendum Note (Signed)
Addendum  created 06/08/12 1413 by Collier Flowers, CRNA   Modules edited:Charges VN

## 2012-06-08 NOTE — Anesthesia Preprocedure Evaluation (Signed)
Anesthesia Evaluation  Patient identified by MRN, date of birth, ID band Patient awake    Reviewed: Allergy & Precautions, H&P , NPO status , Patient's Chart, lab work & pertinent test results  History of Anesthesia Complications (+) PONV  Airway Mallampati: II TM Distance: >3 FB Neck ROM: full    Dental No notable dental hx. (+) Teeth Intact   Pulmonary neg pulmonary ROS,    Pulmonary exam normal       Cardiovascular negative cardio ROS  Rhythm:regular Rate:Normal     Neuro/Psych  Headaches, negative psych ROS   GI/Hepatic hiatal hernia, GERD-  Medicated and Controlled,Achalasia   Endo/Other  negative endocrine ROS  Renal/GU negative Renal ROS  negative genitourinary   Musculoskeletal   Abdominal Normal abdominal exam  (+)   Peds  Hematology   Anesthesia Other Findings   Reproductive/Obstetrics                           Anesthesia Physical  Anesthesia Plan  ASA: II  Anesthesia Plan: General LMA   Post-op Pain Management:    Induction:   Airway Management Planned:   Additional Equipment:   Intra-op Plan:   Post-operative Plan:   Informed Consent: I have reviewed the patients History and Physical, chart, labs and discussed the procedure including the risks, benefits and alternatives for the proposed anesthesia with the patient or authorized representative who has indicated his/her understanding and acceptance.   Dental Advisory Given  Plan Discussed with: Anesthesiologist, CRNA and Surgeon  Anesthesia Plan Comments:         Anesthesia Quick Evaluation

## 2012-06-08 NOTE — Anesthesia Postprocedure Evaluation (Signed)
  Anesthesia Post-op Note  Patient: Christy Collins  Procedure(s) Performed: Procedure(s) (LRB) with comments: DILATATION & CURETTAGE/HYSTEROSCOPY WITH VERSAPOINT RESECTION (N/A) - and novasure  Patient Location: PACU  Anesthesia Type:General  Level of Consciousness: awake, alert  and oriented  Airway and Oxygen Therapy: Patient Spontanous Breathing  Post-op Pain: none  Post-op Assessment: Post-op Vital signs reviewed, Patient's Cardiovascular Status Stable, Respiratory Function Stable, Patent Airway, No signs of Nausea or vomiting and Pain level controlled  Post-op Vital Signs: Reviewed and stable  Complications: No apparent anesthesia complications

## 2012-06-08 NOTE — Transfer of Care (Signed)
Immediate Anesthesia Transfer of Care Note  Patient: Christy Collins  Procedure(s) Performed: Procedure(s) (LRB) with comments: DILATATION & CURETTAGE/HYSTEROSCOPY WITH VERSAPOINT RESECTION (N/A) - and novasure  Patient Location: PACU  Anesthesia Type:General  Level of Consciousness: awake, oriented, sedated and patient cooperative  Airway & Oxygen Therapy: Patient Spontanous Breathing and Patient connected to nasal cannula oxygen  Post-op Assessment: Report given to PACU RN and Post -op Vital signs reviewed and stable  Post vital signs: Reviewed and stable  Complications: No apparent anesthesia complications

## 2012-06-08 NOTE — H&P (View-Only) (Signed)
   Reason for admission:   Menorrhagia with submucosal fibroid History:    Christy Collins is a 41 y.o. female, [phone], who presents for hysteroscopy with endometrial ablation after removal of sub-mucosal fibroid  Was seen 04/06/12: Pt states that cycles are very irregular, with heavy bleeding for the past 6 months. States that she is unable to estimate when she will start. Also states that during cycle she is having confusion, night sweats, and cramping. Also c/o pain with intercourse during cycle along with back pain   Review of system:  Non-contributory   Past Medical History:  Migraines Hiatal hernia Breast lumpectomy (benign) BTL T&A  Allergies:  NKDA  Social History:   married and works as a [employer]  Family History:   non-contributory  Physical exam:    General Appearance: Alert, appropriate appearance for age. No acute distress Neck / Thyroid: Supple, no masses, nodes or enlargement Lungs: clear to auscultation bilaterally Back: No CVA tenderness. Cardiovascular: Regular rate and rhythm. S1, S2, no murmur Gastrointestinal: Soft, non-tender, no masses or organomegaly Pelvic Exam: VV normal. Cervix normal. Uterus slightly enlarged but NT. Adnexa normal  ULTRASOUND: Uterus AV  Adnexa WNL  Endometrium 1.13 mm  Free fluid: no  Other findings: A solid mass is identified within the uterine cavity. There is also a small amount of endocavity fluid. Measure 2.4 x 2.2 x 2.5 cm     Assessment:   Menorrhagia with sub-mucosal fibroid   Plan:    Proceed with HSC / D&C/ resection of fibroid / Novasure ablation. Procedure, risks and benefits reviewed with patient including but not limited to bleeding, infection, injury to uterus and subsequently to intra-abdominal organs. Patient voices understanding and is agreeable to proceed.

## 2012-06-08 NOTE — Interval H&P Note (Signed)
History and Physical Interval Note:  06/08/2012 9:02 AM  Christy Collins  has presented today for surgery, with the diagnosis of menorrhagia  The various methods of treatment have been discussed with the patient and family. After consideration of risks, benefits and other options for treatment, the patient has consented to  Procedure(s) (LRB) with comments: DILATATION & CURETTAGE/HYSTEROSCOPY WITH VERSAPOINT RESECTION (N/A) - and novasure as a surgical intervention .  The patient's history has been reviewed, patient examined, no change in status, stable for surgery.  I have reviewed the patient's chart and labs.  Questions were answered to the patient's satisfaction.     Alexzander Dolinger A

## 2012-06-08 NOTE — Op Note (Signed)
Preoperative diagnosis: Menorrhagia with submucosal fibroid  Postoperative diagnosis: Same  Anesthesia: Gen.  Anesthesiologist: Dr. Casimiro Needle foster  Procedure: Hysteroscopy with myomectomy, D&C, endometrial ablation with NovaSure  Surgeon: Dr. Dois Davenport Brallan Denio  Estimated blood loss: Minimal  Water deficit: 275 cc  Procedure:  After being informed of the planned procedure with possible complications including but not limited to bleeding, infection, uterine injury, subsequent intra-abdominal organ injury, failure of treatment, informed consent was obtained and patient was taken to or #3. She was given general anesthesia with endotracheal intubation without any consultation. She was placed in the lithotomy position prepped and draped in a sterile fashion and her bladder was emptied with an in and out red rubber catheter.  Pelvic exam reveals an anteverted uterus normal in size and shape and 2 normal adnexa. A weighted speculum is inserted in the vagina and the anterior lip of the cervix is grasped with a tenaculum forcep. Cervical measurement is 3.5 cm and the uterus is sounded at 9 cm. We proceed with a paracervical block using 20 cc of Nesacaine 1% and dilate the cervix with Hegar dilator until #29. This allows for easy insertion of the VersaPoint hysteroscope. With perfusion of normal saline at a maximum pressure of 90 mm mercury we're able to visualize the entire uterine cavity. We note a 4 cm fibroid which appears to be pedunculated from the anterior wall of the uterus. We are able to go around and visualize the uterine fundus and both tubal ostia. Endometrium appears normal around this fibroid.  Using the VersaPoint resectoscope we proceed with section of the pedicle until the fibroid was completely detached. After multiple attempts to evacuate the fibroid through the cervix with graspers we have to perform morcellation with the VersaPoint resectoscope. We are then able to remove all pieces in  the reminder of the fibroid through the cervix.  We then proceed with sharp curettage of the endometrial cavity to remove a normal-appearing amount of endometrium.  NovaSure is then inserted in the uterine cavity deployed for a width of 4.2 cm. Cavity integrity is confirmed and the NovaSure is triggered for 60 seconds at a power of 127.  Hysteroscope is reinserted and the uterine cavity to confirm a complete blanching all up to the cervical canal.  All instruments are removed. Instrument and sponge count is complete x2. Estimated blood loss is minimal. Water deficit is  275 cc.bleeding on the sites of the tenaculum forceps is controlled with 2 figure-of-eight stitches of 3-0 Vicryl both on the anterior and posterior lip of the cervix.  Specimen: Fragments of morcellated fibroid and endometrial curettage sent to pathology

## 2012-06-10 ENCOUNTER — Telehealth: Payer: Self-pay | Admitting: Obstetrics and Gynecology

## 2012-06-10 NOTE — Telephone Encounter (Signed)
To office Monday am for UA and C&S,  probable UTI use OTC Azo, spray warm water on perineum with void or void in tub, baking soda bath may provide relief continue po water encouraged. Verbalized understanding. Lavera Guise, CNM

## 2012-06-11 ENCOUNTER — Telehealth: Payer: Self-pay | Admitting: Obstetrics and Gynecology

## 2012-06-11 ENCOUNTER — Encounter (HOSPITAL_COMMUNITY): Payer: Self-pay | Admitting: Obstetrics and Gynecology

## 2012-06-11 NOTE — Telephone Encounter (Signed)
Tc to pt regarding msg.  Pt states she had surgery w/ SR recently and is now having burning with urination wants to know if that is normal.  Pt advised that it is not normal and needs an evaluation.  Pt scheduled for an appt tomorrow 06/12/12 @ 1540 w/ SR for eval, pt voices agreement.

## 2012-06-12 ENCOUNTER — Encounter: Payer: Self-pay | Admitting: Obstetrics and Gynecology

## 2012-06-12 ENCOUNTER — Ambulatory Visit (INDEPENDENT_AMBULATORY_CARE_PROVIDER_SITE_OTHER): Payer: BC Managed Care – PPO | Admitting: Obstetrics and Gynecology

## 2012-06-12 VITALS — BP 96/60 | Temp 98.7°F | Ht 63.5 in | Wt 101.0 lb

## 2012-06-12 DIAGNOSIS — N949 Unspecified condition associated with female genital organs and menstrual cycle: Secondary | ICD-10-CM

## 2012-06-12 DIAGNOSIS — R300 Dysuria: Secondary | ICD-10-CM

## 2012-06-12 DIAGNOSIS — N938 Other specified abnormal uterine and vaginal bleeding: Secondary | ICD-10-CM

## 2012-06-12 LAB — POCT URINALYSIS DIPSTICK
Glucose, UA: NEGATIVE
Ketones, UA: NEGATIVE
Leukocytes, UA: NEGATIVE
Protein, UA: NEGATIVE

## 2012-06-12 NOTE — Progress Notes (Signed)
  Subjective:     Christy Collins is a 41 y.o. female who presents for some pelvic pain with burning on urination x 3 days . Almost resolved now Had [facility] / resection of fibroid with Versapoint / Novasure on 06/08/12  Pathology report was reviewed with patient. Diagnosis 1. Uterine fibroid(s) - FRAGMENTS OF LEIOMYOMATA. 2. Endometrium, curettage - BENIGN SECRETORY ENDOMETRIUM WITH STROMAL BREAKDOWN, NO ATYPIA, HYPERPLASIA OR MALIGNANCY. The following portions of the patient's history were reviewed and updated as appropriate: allergies, current medications, past family history, past medical history, past social history, past surgical history and problem list.  Review of Systems Pertinent items are noted in HPI.  U/A: normal  Objective:    There were no vitals taken for this visit. Weight:  Wt Readings from Last 1 Encounters:  05/28/12 100 lb (45.36 kg)    BMI: There is no height or weight on file to calculate BMI.  General Appearance: Alert, appropriate appearance for age. No acute distress Lungs: clear to auscultation bilaterally Back: No CVA tenderness Cardiovascular: Regular rate and rhythm. S1, S2, no murmur Gastrointestinal: Soft, non-tender, no masses or organomegaly Pelvic Exam: Bimanual exam normal Uterus is NT  Assessment:    Doing well postoperatively. Operative findings again reviewed. No evidence of UTI   Plan:   Surgery F/U 3 months  AZO discussed   Silverio Lay MD 12/3/201310:16 AM

## 2012-06-20 ENCOUNTER — Encounter: Payer: Self-pay | Admitting: Obstetrics and Gynecology

## 2012-08-21 ENCOUNTER — Encounter (HOSPITAL_COMMUNITY): Payer: Self-pay | Admitting: *Deleted

## 2012-08-21 ENCOUNTER — Emergency Department (HOSPITAL_COMMUNITY)
Admission: EM | Admit: 2012-08-21 | Discharge: 2012-08-21 | Disposition: A | Discharge status: Home / Self Care | Payer: BC Managed Care – PPO | Source: Home / Self Care | Attending: Family Medicine | Admitting: Family Medicine

## 2012-08-21 DIAGNOSIS — G5600 Carpal tunnel syndrome, unspecified upper limb: Secondary | ICD-10-CM

## 2012-08-21 DIAGNOSIS — G5603 Carpal tunnel syndrome, bilateral upper limbs: Secondary | ICD-10-CM

## 2012-08-21 NOTE — ED Provider Notes (Signed)
History     CSN: [id number]  Arrival date & time 08/21/12  1446   First MD Initiated Contact with Patient 08/21/12 1510      Chief Complaint  Patient presents with  . Weakness    (Consider location/radiation/quality/duration/timing/severity/associated sxs/prior treatment) Patient is a 42 y.o. female presenting with weakness. The history is provided by the patient.  Weakness This is a new problem. The current episode started more than 2 days ago. The problem has not changed since onset.Associated symptoms include headaches. Associated symptoms comments: Numbness bilat wrists/ forearms. Sx at night and resolve in am..    Past Medical History  Diagnosis Date  . Esophageal reflux   . Stricture and stenosis of esophagus   . Achalasia   . Constipation   . Noncompliance   . Yeast infection   . Bacterial infection   . Ovarian cyst   . Anemia   . Migraine   . History of chicken pox   . Hiatal hernia     Past Surgical History  Procedure Laterality Date  . Tonsillectomy    . Tubal ligation    . Dilation and curettage of uterus    . Breast lumpectomy    . Dilitation & currettage/hystroscopy with versapoint resection  06/08/2012    Procedure: DILATATION & CURETTAGE/HYSTEROSCOPY WITH VERSAPOINT RESECTION;  Surgeon: Esmeralda Arthur, MD;  Location: [facility];  Service: Gynecology;  Laterality: N/A;  and novasure    Family History  Problem Relation Age of Onset  . Asthma Mother   . Asthma Daughter   . Cancer Maternal Grandmother   . Cancer Paternal Uncle   . Hypertension Paternal Uncle   . Hypertension Paternal Aunt   . Diabetes Paternal Uncle   . Diabetes Paternal Aunt   . Rheumatic fever Daughter     History  Substance Use Topics  . Smoking status: Never Smoker   . Smokeless tobacco: Never Used  . Alcohol Use: Yes    OB History   Grav Para Term Preterm Abortions TAB SAB Ect Mult Living   4 3   1     3       Review of Systems  Constitutional: Negative.   Skin:  Negative.   Neurological: Positive for numbness and headaches. Negative for dizziness, facial asymmetry, weakness and light-headedness.  Psychiatric/Behavioral: Negative.     Allergies  Review of patient's allergies indicates no known allergies.  Home Medications   Current Outpatient Rx  Name  Route  Sig  Dispense  Refill  . cetirizine (ZYRTEC) 10 MG tablet   Oral   Take 10 mg by mouth daily as needed. For allergy symptoms         . fluconazole (DIFLUCAN) 150 MG tablet   Oral   Take 1 tablet (150 mg total) by mouth once. Take 1 tablet by mouth today   1 tablet   0   . fluconazole (DIFLUCAN) 150 MG tablet   Oral   Take 1 tablet (150 mg total) by mouth once. Take 1 tablet by mouth today   2 tablet   0   . HYDROcodone-acetaminophen (VICODIN) 5-500 MG per tablet   Oral   Take 1 tablet by mouth every 6 (six) hours as needed for pain.   24 tablet   0   . ibuprofen (ADVIL,MOTRIN) 600 MG tablet   Oral   Take 1 tablet (600 mg total) by mouth every 6 (six) hours as needed for pain.   30 tablet   0   .  IRON PO   Oral   Take 1 tablet by mouth daily.           BP 124/82  Pulse 72  Temp(Src) 98.6 F (37 C) (Oral)  SpO2 100%  LMP 07/30/2012  Physical Exam  Nursing note and vitals reviewed. Constitutional: She is oriented to person, place, and time. She appears well-developed and well-nourished.  HENT:  Head: Normocephalic.  Neck: Normal range of motion. Neck supple.  Musculoskeletal: She exhibits no tenderness.       Arms: Lymphadenopathy:    She has no cervical adenopathy.  Neurological: She is alert and oriented to person, place, and time.  Skin: Skin is warm and dry.  Psychiatric: She has a normal mood and affect. Her behavior is normal. Judgment and thought content normal.    ED Course  Procedures (including critical care time)  Labs Reviewed - No data to display No results found.   1. Carpal tunnel syndrome on both sides       MDM           Linna Hoff, MD 08/21/12 [phone]

## 2012-08-21 NOTE — ED Notes (Signed)
Reported  Off   To  Suzanne    

## 2012-08-21 NOTE — ED Notes (Signed)
Pt  Reports  For  The  Last  sev  Days  She  Has   Woke  Up   With  Weakness in  Both  Arms       She      denys  Any injury  -  She reports   About  2 hours  Ago  She  Developed  r  Sided  Headache  Which  Alarmed   Her   She  Is  Moving  All  extremitys   She  Is   Speaking  With a  Clear           Even  Voice             She  Ambulated  To  Room with a  Steady  Even  Gait

## 2012-08-22 ENCOUNTER — Encounter: Payer: Self-pay | Admitting: *Deleted

## 2012-08-31 ENCOUNTER — Encounter: Payer: Self-pay | Admitting: Gastroenterology

## 2012-08-31 ENCOUNTER — Ambulatory Visit (INDEPENDENT_AMBULATORY_CARE_PROVIDER_SITE_OTHER): Payer: BC Managed Care – PPO | Admitting: Gastroenterology

## 2012-08-31 ENCOUNTER — Telehealth: Payer: Self-pay | Admitting: *Deleted

## 2012-08-31 VITALS — BP 112/70 | HR 50 | Ht 63.5 in | Wt 102.0 lb

## 2012-08-31 DIAGNOSIS — K220 Achalasia of cardia: Secondary | ICD-10-CM

## 2012-08-31 DIAGNOSIS — R0789 Other chest pain: Secondary | ICD-10-CM

## 2012-08-31 DIAGNOSIS — R1110 Vomiting, unspecified: Secondary | ICD-10-CM

## 2012-08-31 MED ORDER — HYOSCYAMINE SULFATE 0.125 MG SL SUBL: 0.1250 mg | SUBLINGUAL_TABLET | SUBLINGUAL | Status: DC | PRN

## 2012-08-31 MED ORDER — TRAMADOL HCL 50 MG PO TABS: 50.0000 mg | ORAL_TABLET | Freq: Four times a day (QID) | ORAL | Status: DC | PRN

## 2012-08-31 MED ORDER — DEXLANSOPRAZOLE 60 MG PO CPDR: 60.0000 mg | DELAYED_RELEASE_CAPSULE | Freq: Every day | ORAL | Status: DC

## 2012-08-31 NOTE — Progress Notes (Signed)
History of Present Illness:  This is a 42 year old [place] American female who thoroughly evaluated 8-10 years ago for achalasia.  She is referred to [facility] in [place], never kept her appointment.  She had previous endoscopy, upper GI series, and manometry that all consistent with achalasia.  She continues with she says "severe chest pain like I'm have a heart attack" both day and night also dysphagia for solids and liquids with regurgitation, and she continues to have low weight. Review of her chart shows that she's had atypical chest pain since 2003.  She denies a lower GI or hepatobiliary complaints.  Apparently she's had reactions in the past to several different PPI medications and to "anesthesia".  I have reviewed this patient's present history, medical and surgical past history, allergies and medications.     ROS:   All systems were reviewed and are negative unless otherwise stated in the HPI.    Physical Exam: Very thin-appearing patient in no acute distress.  Blood pressure 112/70, pulse 50 and regular and weight 182 with a BMI of 17.78.  She has only 82% oxygen saturation which is surprising. General well developed well nourished patient in no acute distress, appearing their stated age Eyes PERRLA, no icterus, fundoscopic exam per opthamologist Skin no lesions noted Neck supple, no adenopathy, no thyroid enlargement, no tenderness Chest clear to percussion and auscultation Heart no significant murmurs, gallops or rubs noted Abdomen no hepatosplenomegaly masses or tenderness, BS normal.  Extremities no acute joint lesions, edema, phlebitis or evidence of cellulitis. Neurologic patient oriented x 3, cranial nerves intact, no focal neurologic deficits noted. Psychological mental status normal and normal affect.  Assessment and plan: Untreated achalasia with patient lost to followup over the last 8 years.  Will repeat her barium swallow, soft tissue  esophageal manometry,  and have referred her to Dr. Abbey Chatters for consideration of laparoscopic myotomy of her lower esophageal sphincter area.  She has atypical chest pain seen frequently in this condition, been present constantly for 10 years,, and we will try a different PPI, when necessary sublingual Levsin, and when necessary tramadol.  She is a somewhat difficult patient because of noncompliance.  I will not repeat her endoscopy because of her " anesthesia allergies".  There is no evidence of significant cardiovascular disease, and her oxygen saturation is probably a mistake.  Review of chest x-ray in September is normal.  Encounter Diagnosis  Name Primary?  . Achalasia Yes

## 2012-08-31 NOTE — Patient Instructions (Addendum)
You have been given a separate informational sheet regarding your tobacco use, the importance of quitting and local resources to help you quit.  You have been scheduled for an endoscopy with propofol 09-21-2012. Please follow written instructions given to you at your visit today. If you use inhalers (even only as needed) or a CPAP machine, please bring them with you on the day of your procedure.  You have been scheduled for an appointment with _____ at [facility]. Your appointment is on ____ at ___. Please arrive at _____ for registration. Make certain to bring a list of current medications, including any over the counter medications or vitamins. Also bring your co-pay if you have one as well as your insurance cards. [facility] is located at [address], [address]. Should you need to reschedule your appointment, please contact them at [phone].  ______________________________________________________________________________________________________________________  You have been scheduled for an esophageal manometry at [facility] on 09-17-2012 at 8 AM. Please arrive 30 minutes prior to your procedure for registration. You will need to go to outpatient registration (1st floor of the hospital) first. Make certain to bring your insurance cards as well as a complete list of medications.  Please remember the following:  1) Nothing to eat or drink after 12:00 midnight on the night before your test.  2) Hold all diabetic medications/insulin the morning of the test. You may eat and take your medications after the test.  3) For 3 days prior to your test do not take: Dexilant, Prevacid, Nexium, Protonix, Aciphex, Zegerid, Pantoprazole, Prilosec or omeprazole.  4) For 2 days prior to your test, do not take: Reglan, Tagamet, Zantac, Axid or Pepcid.  5) You MAY use an antacid such as Rolaids or Tums up to 12 hours prior to your test.  It will take at least 2  weeks to receive the results of this test from your physician. ------------------------------------------ ABOUT ESOPHAGEAL MANOMETRY Esophageal manometry (muh-NOM-uh-tree) is a test that gauges how well your esophagus works. Your esophagus is the long, muscular tube that connects your throat to your stomach. Esophageal manometry measures the rhythmic muscle contractions (peristalsis) that occur in your esophagus when you swallow. Esophageal manometry also measures the coordination and force exerted by the muscles of your esophagus.  During esophageal manometry, a thin, flexible tube (catheter) that contains sensors is passed through your nose, down your esophagus and into your stomach. Esophageal manometry can be helpful in diagnosing some mostly uncommon disorders that affect your esophagus.  Why it's done Esophageal manometry is used to evaluate the movement (motility) of food through the esophagus and into the stomach. The test measures how well the circular bands of muscle (sphincters) at the top and bottom of your esophagus open and close, as well as the pressure, strength and pattern of the wave of esophageal muscle contractions that moves food along.  What you can expect Esophageal manometry is an outpatient procedure done without sedation. Most people tolerate it well. You may be asked to change into a hospital gown before the test starts.  During esophageal manometry  While you are sitting up, a member of your health care team sprays your throat with a numbing medication or puts numbing gel in your nose or both.  A catheter is guided through your nose into your esophagus. The catheter may be sheathed in a water-filled sleeve. It doesn't interfere with your breathing. However, your eyes may water, and you may gag. You may have a slight nosebleed  from irritation.  After the catheter is in place, you may be asked to lie on your back on an exam table, or you may be asked to remain seated.  You then  swallow small sips of water. As you do, a computer connected to the catheter records the pressure, strength and pattern of your esophageal muscle contractions.  During the test, you'll be asked to breathe slowly and smoothly, remain as still as possible, and swallow only when you're asked to do so.  A member of your health care team may move the catheter down into your stomach while the catheter continues its measurements.  The catheter then is slowly withdrawn. The test usually lasts 20 to 30 minutes.  After esophageal manometry  When your esophageal manometry is complete, you may return to your normal activities  This test typically takes 30-45 minutes to complete. ________________________________________________________________________________  We have sent the following medications to your pharmacy for you to pick up at your convenience: Tramadol and Levsin, please use as directed.  Samples of Omeprazole given today, please take one capsule by mouth thirty minutes before breakfast once daily, if this works well for you please call back so we can send a prescription. __________________________________________________________________________________________________________________  Achalasia Achalasia is a condition in which a person cannot get food through the lower esophagus. This is the tube that carries food from your mouth to your stomach. This happens because there are no nerves to the lower esophagus and the esophageal sphincter. This is the circular muscle between the stomach and esophagus that relaxes to allow food into the stomach. It then contracts to keep food in the stomach. This absence of nerves may be present since birth. This condition causes difficulty swallowing, chest pain, and food coming back up in the mouth after being swallowed. DIAGNOSIS  This condition is diagnosed by x-ray, pressure studies in the esophagus, and endoscopy. This is when your caregiver looks into your  esophagus with a small flexible telescope. The portion of the esophagus above the narrowing is usually enlarged when the condition is present. TREATMENT   Soft diets are helpful.  Medications will help the food pass more easily into the stomach.  If conservative treatment (as above) does not work, stretching the end of the esophagus with a balloon may help.  Sometimes surgical treatment is used and a segment of esophagus is removed. SEEK IMMEDIATE MEDICAL CARE IF:  You are unable to keep fluids down or it feels as though food sticks in your chest area.  Vomiting becomes persistent.  Chest or belly pain develops, increases, or localizes.  You have a fever. If problems are continuing and not allowing you to live a normal lifestyle, talk with your caregiver and discuss medical means to help this. Document Released: 04/06/2005 Document Revised: 09/19/2011 Document Reviewed: 07/20/2006 [facility] Patient Information 2013 [place], [place].

## 2012-08-31 NOTE — Telephone Encounter (Signed)
PER DR PATTERSON CANCEL EGD DUE TO PATIENTS ALLERGIES TO UNKNOWN ANESTHESIA AND SCHEDULE BARIUM SWALLOW.  OFFICE NOTE ON 08-31-2012--------PER DR. PATTERSON----(I will not repeat her endoscopy because of her " anesthesia allergies".)  ______________________________________________________________________________________________________________________    Christy Collins have been scheduled for a Barium Swallow at [facility] (1st floor of the hospital) on 09-05-2012 at 11:30 am. Please arrive 15 minutes prior to your appointment for registration. Make certain not to have anything to eat or drink 6 hours prior to your test. If you need to reschedule for any reason, please contact radiology at [phone] to do so. ____________________________________________________________________________________________________________  I called patient and told her that her upper endoscopy is cancelled because of the anesthesia allergies and we scheduled a barium swallow instead. Instructions for barium swallow were given to patient and I advised patient to keep appointment with [facility] when they call, keep esophageal manometry appointment and barium swallow appointment.  Patient verbalized understanding.

## 2012-09-05 ENCOUNTER — Ambulatory Visit (HOSPITAL_COMMUNITY)
Admission: RE | Admit: 2012-09-05 | Discharge: 2012-09-05 | Disposition: A | Discharge status: Home / Self Care | Payer: BC Managed Care – PPO | Source: Ambulatory Visit | Attending: Gastroenterology | Admitting: Gastroenterology

## 2012-09-05 DIAGNOSIS — R1310 Dysphagia, unspecified: Secondary | ICD-10-CM | POA: Insufficient documentation

## 2012-09-05 DIAGNOSIS — K224 Dyskinesia of esophagus: Secondary | ICD-10-CM | POA: Insufficient documentation

## 2012-09-05 DIAGNOSIS — K222 Esophageal obstruction: Secondary | ICD-10-CM | POA: Insufficient documentation

## 2012-09-06 ENCOUNTER — Telehealth: Payer: Self-pay | Admitting: *Deleted

## 2012-09-06 NOTE — Telephone Encounter (Signed)
Pt informed of BS results and she will see [facility] on 09/10/12 and EM already scheduled for 09/17/12. Pt stated understanding with prep instructions.

## 2012-09-06 NOTE — Telephone Encounter (Signed)
Message copied by Florene Glen on Thu Sep 06, 2012 10:40 AM ------      Message from: Jarold Motto, DAVID R      Created: Wed Sep 05, 2012  2:05 PM       She has achalasia...manometry ASAP ------

## 2012-09-10 ENCOUNTER — Encounter (INDEPENDENT_AMBULATORY_CARE_PROVIDER_SITE_OTHER): Payer: Self-pay | Admitting: General Surgery

## 2012-09-10 ENCOUNTER — Ambulatory Visit (INDEPENDENT_AMBULATORY_CARE_PROVIDER_SITE_OTHER): Payer: BC Managed Care – PPO | Admitting: General Surgery

## 2012-09-10 ENCOUNTER — Encounter (HOSPITAL_COMMUNITY): Admission: RE | Payer: Self-pay | Source: Ambulatory Visit

## 2012-09-10 ENCOUNTER — Ambulatory Visit (HOSPITAL_COMMUNITY)
Admission: RE | Admit: 2012-09-10 | Payer: BC Managed Care – PPO | Source: Ambulatory Visit | Admitting: Gastroenterology

## 2012-09-10 VITALS — BP 100/60 | HR 84 | Temp 98.4°F | Resp 18 | Ht 63.5 in | Wt 101.0 lb

## 2012-09-10 SURGERY — MANOMETRY, ESOPHAGUS

## 2012-09-10 NOTE — Patient Instructions (Signed)
We will call you after we receive the manometry results.  Achalasia Achalasia is a condition in which a person cannot get food through the lower esophagus. This is the tube that carries food from your mouth to your stomach. This happens because there are no nerves to the lower esophagus and the esophageal sphincter. This is the circular muscle between the stomach and esophagus that relaxes to allow food into the stomach. It then contracts to keep food in the stomach. This absence of nerves may be present since birth. This condition causes difficulty swallowing, chest pain, and food coming back up in the mouth after being swallowed. DIAGNOSIS  This condition is diagnosed by x-ray, pressure studies in the esophagus, and endoscopy. This is when your caregiver looks into your esophagus with a small flexible telescope. The portion of the esophagus above the narrowing is usually enlarged when the condition is present. TREATMENT   Soft diets are helpful.  Medications will help the food pass more easily into the stomach.  If conservative treatment (as above) does not work, stretching the end of the esophagus with a balloon may help.  Sometimes surgical treatment is used and a the muscle in the lower esophagus is cut to allow food to pass. SEEK IMMEDIATE MEDICAL CARE IF:  You are unable to keep fluids down or it feels as though food sticks in your chest area.  Vomiting becomes persistent.  Chest or belly pain develops, increases, or localizes.  You have a fever. If problems are continuing and not allowing you to live a normal lifestyle, talk with your caregiver and discuss medical means to help this. Document Released: 04/06/2005 Document Revised: 09/19/2011 Document Reviewed: 07/20/2006 [facility] Patient Information 2013 [place], [place].

## 2012-09-10 NOTE — Progress Notes (Signed)
Patient ID: Christy Collins, female   DOB: 16-Apr-1971, 42 y.o.   MRN: [id number]  No chief complaint on file.   HPI Christy Collins is a 42 y.o. female.   HPI  She is referred by Dr. Sheryn Bison for further evaluation and treatment of achalasia. She had an extensive workup approximately 10 years ago including endoscopy and manometry which reportedly were consistent with achalasia. She was referred to [facility] but never made the appointment and was lost to follow up for about 8 years. She recently saw Dr. Jarold Motto complaining of dysphagia to solids and some liquids. She has not lost weight. She says when she first started eating she regurgitates food but after that she is able to keep most of the food down. Her last endoscopy was about 8 years ago and she had a dilation which helped her up until recently. She had a recent upper GI which demonstrated narrowing at the gastroesophageal junction and a nonspecific motility disorder. There is also a small outpouching near the distal esophagus.  She does have some heartburn and states Alka-Seltzer helps.  She is scheduled to have repeat manometry on March 10.  Past Medical History  Diagnosis Date  . Stricture and stenosis of esophagus   . Achalasia   . Constipation   . Noncompliance   . Yeast infection   . Bacterial infection   . Ovarian cyst   . Anemia   . Migraine   . History of chicken pox   . Hiatal hernia   . GERD (gastroesophageal reflux disease)     Chronic   . Hiatal hernia     Past Surgical History  Procedure Laterality Date  . Tonsillectomy    . Tubal ligation    . Dilation and curettage of uterus    . Breast lumpectomy    . Dilitation & currettage/hystroscopy with versapoint resection  06/08/2012    Procedure: DILATATION & CURETTAGE/HYSTEROSCOPY WITH VERSAPOINT RESECTION;  Surgeon: Esmeralda Arthur, MD;  Location: [facility];  Service: Gynecology;  Laterality: N/A;  and novasure    Family History  Problem Relation Age of  Onset  . Asthma Mother   . Asthma Daughter   . Cancer Maternal Grandmother   . Cancer Paternal Uncle   . Hypertension Paternal Uncle   . Hypertension Paternal Aunt   . Diabetes Paternal Uncle   . Diabetes Paternal Aunt   . Rheumatic fever Daughter     Social History History  Substance Use Topics  . Smoking status: Current Every Day Smoker -- 0.50 packs/day    Types: Cigarettes  . Smokeless tobacco: Never Used  . Alcohol Use: No    No Known Allergies  Current Outpatient Prescriptions  Medication Sig Dispense Refill  . hyoscyamine (LEVSIN/SL) 0.125 MG SL tablet Place 1 tablet (0.125 mg total) under the tongue every 4 (four) hours as needed for cramping.  30 tablet  0  . ibuprofen (ADVIL,MOTRIN) 600 MG tablet Take 1 tablet (600 mg total) by mouth every 6 (six) hours as needed for pain.  30 tablet  0   No current facility-administered medications for this visit.    Review of Systems Review of Systems  Constitutional: Negative for unexpected weight change.       Night sweats.  Cardiovascular: Negative.   Gastrointestinal: Positive for nausea. Negative for abdominal pain.  Genitourinary: Negative.   Neurological: Negative.     Blood pressure 100/60, pulse 84, temperature 98.4 F (36.9 C), temperature source Temporal, resp. rate  18, height 5' 3.5" (1.613 m), weight 101 lb (45.813 kg), last menstrual period 09/02/2012.  Physical Exam Physical Exam  Constitutional: No distress.  Thin female.  HENT:  Head: Normocephalic and atraumatic.  Eyes: EOM are normal. No scleral icterus.  Cardiovascular: Normal rate and regular rhythm.   Pulmonary/Chest: Effort normal and breath sounds normal.  Abdominal: Soft. She exhibits no distension and no mass. There is no tenderness.  Musculoskeletal: She exhibits no edema.  Lymphadenopathy:    She has no cervical adenopathy.  Neurological: She is alert.  Skin: Skin is warm and dry.  Psychiatric: She has a normal mood and affect. Her  behavior is normal.    Data Reviewed Upper GI. Previous endoscopy report. Notes from Dr. Jarold Motto.  Assessment    Achalasia versus nonspecific motility disorder-previous workup was consistent with achalasia. Manometry is scheduled to be done next week     Plan    We discussed options for treatment for achalasia including Botox injection and dilatation both of which are temporary. We also discussed laparoscopic possible open Heller myotomy with Dor fundoplication. This has a better long-term result.   I discussed the procedure and risks of achalasia with her. Risks include but are not limited to failure of operation to improve her symptoms, infection, bleeding, anesthesia, wound problems, esophageal or gastric perforation, injury to other intra-abdominal organs. We also talked about the potential for reflux. I told her that the goal of the operation was to make her better but it would not make her esophagus normal.  She seems to understand all this. I will speak with her when I have the results of the manometry.         ROSENBOWER,TODD J 09/10/2012, 10:22 AM

## 2012-09-17 ENCOUNTER — Encounter (HOSPITAL_COMMUNITY)
Admission: RE | Disposition: A | Discharge status: Hospice | Payer: Self-pay | Source: Ambulatory Visit | Attending: Gastroenterology

## 2012-09-17 ENCOUNTER — Ambulatory Visit (HOSPITAL_COMMUNITY)
Admission: RE | Admit: 2012-09-17 | Discharge: 2012-09-17 | Disposition: A | Discharge status: Hospice | Payer: BC Managed Care – PPO | Source: Ambulatory Visit | Attending: Gastroenterology | Admitting: Gastroenterology

## 2012-09-17 DIAGNOSIS — R1310 Dysphagia, unspecified: Secondary | ICD-10-CM | POA: Insufficient documentation

## 2012-09-17 DIAGNOSIS — K220 Achalasia of cardia: Secondary | ICD-10-CM | POA: Insufficient documentation

## 2012-09-17 HISTORY — PX: ESOPHAGEAL MANOMETRY: SHX5429

## 2012-09-17 SURGERY — MANOMETRY, ESOPHAGUS

## 2012-09-17 MED ORDER — LIDOCAINE VISCOUS 2 % MT SOLN
OROMUCOSAL | Status: AC
  Filled 2012-09-17: qty 15

## 2012-09-18 ENCOUNTER — Encounter (HOSPITAL_COMMUNITY): Payer: Self-pay | Admitting: Gastroenterology

## 2012-09-20 ENCOUNTER — Telehealth: Payer: Self-pay | Admitting: *Deleted

## 2012-09-20 NOTE — Telephone Encounter (Signed)
Dr Jarold Motto has reviewed the EM and she needs to f/u with Dr Abbey Chatters for surgery as recommended by him. lmom for pt to call back.

## 2012-09-21 ENCOUNTER — Encounter: Payer: BC Managed Care – PPO | Admitting: Gastroenterology

## 2012-09-24 NOTE — Telephone Encounter (Signed)
lmom for pt to call back

## 2012-10-02 ENCOUNTER — Encounter (INDEPENDENT_AMBULATORY_CARE_PROVIDER_SITE_OTHER): Payer: Self-pay | Admitting: General Surgery

## 2012-10-02 NOTE — Progress Notes (Signed)
Patient ID: Christy Collins, female   DOB: 1971-07-05, 42 y.o.   MRN: [id number] I spoke with her about her manometry results which are consistent with achalasia. Dr. Jarold Motto called and spoke with me about the results. She is interested in proceeding with the help of myotomy. We went over the procedure, risks, and after care again.  We will work on getting the operation scheduled.

## 2012-10-08 NOTE — Telephone Encounter (Signed)
Patient is aware and is scheduled for surgery with Dr. Abbey Chatters on 4/28

## 2012-10-19 ENCOUNTER — Encounter (HOSPITAL_COMMUNITY): Payer: Self-pay | Admitting: Pharmacy Technician

## 2012-10-29 ENCOUNTER — Encounter (HOSPITAL_COMMUNITY): Payer: Self-pay

## 2012-10-29 ENCOUNTER — Encounter (HOSPITAL_COMMUNITY)
Admission: RE | Admit: 2012-10-29 | Discharge: 2012-10-29 | Disposition: A | Discharge status: Home / Self Care | Payer: BC Managed Care – PPO | Source: Ambulatory Visit | Attending: General Surgery | Admitting: General Surgery

## 2012-10-29 DIAGNOSIS — K220 Achalasia of cardia: Secondary | ICD-10-CM | POA: Insufficient documentation

## 2012-10-29 DIAGNOSIS — Z01812 Encounter for preprocedural laboratory examination: Secondary | ICD-10-CM | POA: Insufficient documentation

## 2012-10-29 HISTORY — DX: Nausea with vomiting, unspecified: R11.2

## 2012-10-29 HISTORY — DX: Dermatitis, unspecified: L30.9

## 2012-10-29 HISTORY — DX: Other specified postprocedural states: Z98.890

## 2012-10-29 LAB — CBC WITH DIFFERENTIAL/PLATELET
Basophils Relative: 0 % (ref 0–1)
HCT: 39.3 % (ref 36.0–46.0)
Hemoglobin: 13 g/dL (ref 12.0–15.0)
MCH: 27.9 pg (ref 26.0–34.0)
MCHC: 33.1 g/dL (ref 30.0–36.0)
Monocytes Absolute: 0.4 10*3/uL (ref 0.1–1.0)
Monocytes Relative: 6 % (ref 3–12)
Neutro Abs: 4.4 10*3/uL (ref 1.7–7.7)

## 2012-10-29 LAB — PROTIME-INR
INR: 1.01 (ref 0.00–1.49)
Prothrombin Time: 13.2 seconds (ref 11.6–15.2)

## 2012-10-29 LAB — COMPREHENSIVE METABOLIC PANEL
Albumin: 3.8 g/dL (ref 3.5–5.2)
BUN: 8 mg/dL (ref 6–23)
Chloride: 104 mEq/L (ref 96–112)
Creatinine, Ser: 0.78 mg/dL (ref 0.50–1.10)
GFR calc Af Amer: >90 mL/min (ref >90)
GFR calc non Af Amer: >90 mL/min (ref >90)
Total Bilirubin: 0.3 mg/dL (ref 0.3–1.2)

## 2012-10-29 LAB — ABO/RH: ABO/RH(D): B POS

## 2012-10-29 NOTE — Patient Instructions (Addendum)
Christy Collins  10/29/2012                           YOUR PROCEDURE IS SCHEDULED ON:  11/05/12               PLEASE REPORT TO SHORT STAY CENTER AT :  9:30 AM               CALL THIS NUMBER IF ANY PROBLEMS THE DAY OF SURGERY :               [phone]                      REMEMBER:   Do not eat food or drink liquids AFTER MIDNIGHT  May have clear liquids UNTIL 6 HOURS BEFORE SURGERY (6:00 AM)  Clear liquids include soda, tea, black coffee, apple or grape juice, broth.  Take these medicines the morning of surgery with A SIP OF WATER:  NONE   Do not wear jewelry, make-up   Do not wear lotions, powders, or perfumes.   Do not shave legs or underarms 12 hrs. before surgery (men may shave face)  Do not bring valuables to the hospital.  Contacts, dentures or bridgework may not be worn into surgery.  Leave suitcase in the car. After surgery it may be brought to your room.  For patients admitted to the hospital more than one night, checkout time is 11:00                          The day of discharge.   Patients discharged the day of surgery will not be allowed to drive home                             If going home same day of surgery, must have someone stay with you first                           24 hrs at home and arrange for some one to drive you home from hospital.    Special Instructions:   Please read over the following fact sheets that you were given:               1. MRSA  INFORMATION                      2. [facility] PREPARING FOR SURGERY SHEET               3. STOP ASPIRIN AND HERBAL MEDICATIONS 5 DAYS BEFORE SURGERY                                                X_____________________________________________________________________        Failure to follow these instructions may result in cancellation of your surgery

## 2012-11-05 ENCOUNTER — Encounter (HOSPITAL_COMMUNITY): Payer: Self-pay | Admitting: Anesthesiology

## 2012-11-05 ENCOUNTER — Encounter (HOSPITAL_COMMUNITY): Payer: Self-pay | Admitting: *Deleted

## 2012-11-05 ENCOUNTER — Encounter (HOSPITAL_COMMUNITY)
Admission: RE | Disposition: A | Discharge status: Home / Self Care | Payer: Self-pay | Source: Ambulatory Visit | Attending: General Surgery

## 2012-11-05 ENCOUNTER — Inpatient Hospital Stay (HOSPITAL_COMMUNITY)
Admission: RE | Admit: 2012-11-05 | Discharge: 2012-11-09 | DRG: 154 | Disposition: A | Discharge status: Home / Self Care | Payer: BC Managed Care – PPO | Source: Ambulatory Visit | Attending: General Surgery | Admitting: General Surgery

## 2012-11-05 ENCOUNTER — Ambulatory Visit (HOSPITAL_COMMUNITY): Payer: BC Managed Care – PPO | Admitting: Anesthesiology

## 2012-11-05 DIAGNOSIS — K222 Esophageal obstruction: Secondary | ICD-10-CM | POA: Diagnosis present

## 2012-11-05 DIAGNOSIS — K220 Achalasia of cardia: Secondary | ICD-10-CM

## 2012-11-05 DIAGNOSIS — K219 Gastro-esophageal reflux disease without esophagitis: Secondary | ICD-10-CM | POA: Diagnosis present

## 2012-11-05 DIAGNOSIS — F17200 Nicotine dependence, unspecified, uncomplicated: Secondary | ICD-10-CM | POA: Diagnosis present

## 2012-11-05 HISTORY — PX: HELLER MYOTOMY: SHX5259

## 2012-11-05 HISTORY — PX: ESOPHAGOGASTRODUODENOSCOPY: SHX5428

## 2012-11-05 LAB — TYPE AND SCREEN

## 2012-11-05 SURGERY — ESOPHAGOMYOTOMY, LAPAROSCOPIC, HELLER
Anesthesia: General | Wound class: Clean Contaminated

## 2012-11-05 MED ORDER — SUCCINYLCHOLINE CHLORIDE 20 MG/ML IJ SOLN
INTRAMUSCULAR | Status: DC | PRN
  Administered 2012-11-05: 50 mg via INTRAVENOUS

## 2012-11-05 MED ORDER — ROCURONIUM BROMIDE 100 MG/10ML IV SOLN
INTRAVENOUS | Status: DC | PRN
  Administered 2012-11-05: 10 mg via INTRAVENOUS
  Administered 2012-11-05: 5 mg via INTRAVENOUS
  Administered 2012-11-05: 35 mg via INTRAVENOUS

## 2012-11-05 MED ORDER — ONDANSETRON HCL 4 MG/2ML IJ SOLN
4.0000 mg | INTRAMUSCULAR | Status: AC
  Administered 2012-11-05 – 2012-11-06 (×4): 4 mg via INTRAVENOUS
  Filled 2012-11-05 (×2): qty 2

## 2012-11-05 MED ORDER — KCL-LACTATED RINGERS-D5W 20 MEQ/L IV SOLN
INTRAVENOUS | Status: DC
  Administered 2012-11-05 – 2012-11-09 (×6): via INTRAVENOUS
  Filled 2012-11-05 (×10): qty 1000

## 2012-11-05 MED ORDER — NEOSTIGMINE METHYLSULFATE 1 MG/ML IJ SOLN
INTRAMUSCULAR | Status: DC | PRN
  Administered 2012-11-05: 3 mg via INTRAVENOUS

## 2012-11-05 MED ORDER — LACTATED RINGERS IR SOLN
Status: DC | PRN
  Administered 2012-11-05: 1000 mL

## 2012-11-05 MED ORDER — BUPIVACAINE HCL (PF) 0.25 % IJ SOLN
INTRAMUSCULAR | Status: AC
  Filled 2012-11-05: qty 30

## 2012-11-05 MED ORDER — TISSEEL VH 10 ML EX KIT
PACK | CUTANEOUS | Status: DC | PRN
  Administered 2012-11-05: 10 mL

## 2012-11-05 MED ORDER — ONDANSETRON HCL 4 MG/2ML IJ SOLN
4.0000 mg | INTRAMUSCULAR | Status: DC | PRN
  Administered 2012-11-06 (×2): 4 mg via INTRAVENOUS
  Filled 2012-11-05 (×4): qty 2

## 2012-11-05 MED ORDER — LACTATED RINGERS IV SOLN: INTRAVENOUS | Status: DC

## 2012-11-05 MED ORDER — HYDROMORPHONE HCL PF 1 MG/ML IJ SOLN
INTRAMUSCULAR | Status: AC
  Filled 2012-11-05: qty 1

## 2012-11-05 MED ORDER — TISSEEL VH 10 ML EX KIT
PACK | CUTANEOUS | Status: AC
  Filled 2012-11-05: qty 1

## 2012-11-05 MED ORDER — PROMETHAZINE HCL 25 MG/ML IJ SOLN
12.5000 mg | INTRAMUSCULAR | Status: DC | PRN
  Administered 2012-11-05: 12.5 mg via INTRAVENOUS
  Filled 2012-11-05: qty 1

## 2012-11-05 MED ORDER — SCOPOLAMINE 1 MG/3DAYS TD PT72
MEDICATED_PATCH | TRANSDERMAL | Status: AC
  Filled 2012-11-05: qty 1

## 2012-11-05 MED ORDER — ESMOLOL HCL 10 MG/ML IV SOLN
INTRAVENOUS | Status: DC | PRN
  Administered 2012-11-05 (×2): 10 mg via INTRAVENOUS

## 2012-11-05 MED ORDER — HYDROMORPHONE HCL PF 1 MG/ML IJ SOLN
0.2500 mg | INTRAMUSCULAR | Status: DC | PRN
  Administered 2012-11-05 (×3): 0.25 mg via INTRAVENOUS
  Administered 2012-11-05: 0.5 mg via INTRAVENOUS
  Administered 2012-11-05: 0.25 mg via INTRAVENOUS

## 2012-11-05 MED ORDER — PANTOPRAZOLE SODIUM 40 MG IV SOLR
40.0000 mg | INTRAVENOUS | Status: DC
  Administered 2012-11-05 – 2012-11-07 (×3): 40 mg via INTRAVENOUS
  Filled 2012-11-05 (×4): qty 40

## 2012-11-05 MED ORDER — FENTANYL CITRATE 0.05 MG/ML IJ SOLN
INTRAMUSCULAR | Status: DC | PRN
  Administered 2012-11-05: 100 ug via INTRAVENOUS
  Administered 2012-11-05 (×3): 50 ug via INTRAVENOUS

## 2012-11-05 MED ORDER — CEFAZOLIN SODIUM-DEXTROSE 2-3 GM-% IV SOLR
2.0000 g | INTRAVENOUS | Status: AC
  Administered 2012-11-05: 2 g via INTRAVENOUS

## 2012-11-05 MED ORDER — LACTATED RINGERS IV SOLN
INTRAVENOUS | Status: DC | PRN
  Administered 2012-11-05 (×3): via INTRAVENOUS

## 2012-11-05 MED ORDER — ONDANSETRON HCL 4 MG/2ML IJ SOLN
INTRAMUSCULAR | Status: DC | PRN
  Administered 2012-11-05: 4 mg via INTRAVENOUS

## 2012-11-05 MED ORDER — PROPOFOL 10 MG/ML IV BOLUS
INTRAVENOUS | Status: DC | PRN
  Administered 2012-11-05: 90 mg via INTRAVENOUS

## 2012-11-05 MED ORDER — PROMETHAZINE HCL 25 MG/ML IJ SOLN: 6.2500 mg | INTRAMUSCULAR | Status: DC | PRN

## 2012-11-05 MED ORDER — MIDAZOLAM HCL 5 MG/5ML IJ SOLN
INTRAMUSCULAR | Status: DC | PRN
  Administered 2012-11-05: 2 mg via INTRAVENOUS

## 2012-11-05 MED ORDER — CEFAZOLIN SODIUM 1-5 GM-% IV SOLN
1.0000 g | Freq: Four times a day (QID) | INTRAVENOUS | Status: AC
  Administered 2012-11-05 – 2012-11-06 (×3): 1 g via INTRAVENOUS
  Filled 2012-11-05 (×3): qty 50

## 2012-11-05 MED ORDER — DEXAMETHASONE SODIUM PHOSPHATE 10 MG/ML IJ SOLN
INTRAMUSCULAR | Status: DC | PRN
  Administered 2012-11-05: 8 mg via INTRAVENOUS

## 2012-11-05 MED ORDER — HYDROMORPHONE HCL PF 1 MG/ML IJ SOLN
INTRAMUSCULAR | Status: AC
Start: 2012-11-05 — End: 2012-11-06
  Filled 2012-11-05: qty 1

## 2012-11-05 MED ORDER — SCOPOLAMINE 1 MG/3DAYS TD PT72
1.0000 | MEDICATED_PATCH | Freq: Once | TRANSDERMAL | Status: AC
  Administered 2012-11-05: 1 via TRANSDERMAL

## 2012-11-05 MED ORDER — LIP MEDEX EX OINT
TOPICAL_OINTMENT | CUTANEOUS | Status: AC
  Administered 2012-11-05: 22:00:00
  Filled 2012-11-05: qty 7

## 2012-11-05 MED ORDER — ACETAMINOPHEN 10 MG/ML IV SOLN
INTRAVENOUS | Status: DC | PRN
  Administered 2012-11-05: 1000 mg via INTRAVENOUS

## 2012-11-05 MED ORDER — BUPIVACAINE-EPINEPHRINE 0.5% -1:200000 IJ SOLN
INTRAMUSCULAR | Status: DC | PRN
  Administered 2012-11-05: 10 mL

## 2012-11-05 MED ORDER — MORPHINE SULFATE 2 MG/ML IJ SOLN
2.0000 mg | INTRAMUSCULAR | Status: DC | PRN
  Administered 2012-11-05 – 2012-11-06 (×3): 2 mg via INTRAVENOUS
  Filled 2012-11-05 (×3): qty 1

## 2012-11-05 MED ORDER — ACETAMINOPHEN 10 MG/ML IV SOLN
INTRAVENOUS | Status: AC
  Filled 2012-11-05: qty 100

## 2012-11-05 MED ORDER — GLYCOPYRROLATE 0.2 MG/ML IJ SOLN
INTRAMUSCULAR | Status: DC | PRN
  Administered 2012-11-05: 0.4 mg via INTRAVENOUS

## 2012-11-05 MED ORDER — CEFAZOLIN SODIUM-DEXTROSE 2-3 GM-% IV SOLR
INTRAVENOUS | Status: AC
  Filled 2012-11-05: qty 50

## 2012-11-05 MED ORDER — LIDOCAINE HCL (CARDIAC) 20 MG/ML IV SOLN
INTRAVENOUS | Status: DC | PRN
  Administered 2012-11-05: 100 mg via INTRAVENOUS

## 2012-11-05 SURGICAL SUPPLY — 54 items
APL SKNCLS STERI-STRIP NONHPOA (GAUZE/BANDAGES/DRESSINGS) ×1
APPLIER CLIP 5 13 M/L LIGAMAX5 (MISCELLANEOUS)
APR CLP MED LRG 5 ANG JAW (MISCELLANEOUS)
BENZOIN TINCTURE PRP APPL 2/3 (GAUZE/BANDAGES/DRESSINGS) ×2 IMPLANT
BLADE HEX COATED 2.75 (ELECTRODE) ×1 IMPLANT
CANISTER SUCTION 2500CC (MISCELLANEOUS) ×2 IMPLANT
CLIP APPLIE 5 13 M/L LIGAMAX5 (MISCELLANEOUS) IMPLANT
CLOTH BEACON ORANGE TIMEOUT ST (SAFETY) ×2 IMPLANT
DECANTER SPIKE VIAL GLASS SM (MISCELLANEOUS) ×1 IMPLANT
DEVICE SUT QUICK LOAD TK 5 (STAPLE) ×11 IMPLANT
DEVICE SUT TI-KNOT TK 5X26 (MISCELLANEOUS) ×4 IMPLANT
DEVICE SUTURE ENDOST 10MM (ENDOMECHANICALS) ×2 IMPLANT
DISSECTOR BLUNT TIP ENDO 5MM (MISCELLANEOUS) ×2 IMPLANT
DRAIN PENROSE 18X1/2 LTX STRL (DRAIN) ×2 IMPLANT
DRAPE LAPAROSCOPIC ABDOMINAL (DRAPES) ×2 IMPLANT
DRAPE UTILITY XL STRL (DRAPES) ×2 IMPLANT
DRSG TEGADERM 2-3/8X2-3/4 SM (GAUZE/BANDAGES/DRESSINGS) ×12 IMPLANT
DUPLOJECT EASY PREP 4ML (MISCELLANEOUS) ×1 IMPLANT
ELECT REM PT RETURN 9FT ADLT (ELECTROSURGICAL) ×2
ELECTRODE REM PT RTRN 9FT ADLT (ELECTROSURGICAL) ×1 IMPLANT
ENDOSTITCH 0 SINGLE 48 (SUTURE) ×11 IMPLANT
FILTER SMOKE EVAC LAPAROSHD (FILTER) ×2 IMPLANT
GLOVE BIOGEL PI IND STRL 7.0 (GLOVE) ×1 IMPLANT
GLOVE BIOGEL PI INDICATOR 7.0 (GLOVE) ×5
GLOVE ECLIPSE 8.0 STRL XLNG CF (GLOVE) ×2 IMPLANT
GLOVE INDICATOR 8.0 STRL GRN (GLOVE) ×4 IMPLANT
GOWN STRL NON-REIN LRG LVL3 (GOWN DISPOSABLE) ×2 IMPLANT
GOWN STRL REIN XL XLG (GOWN DISPOSABLE) ×6 IMPLANT
GRASPER LAPSCPC 5X35 EPIX (ENDOMECHANICALS) ×2 IMPLANT
HAND ACTIVATED (MISCELLANEOUS) ×1 IMPLANT
HEMOSTAT SURGICEL 4X8 (HEMOSTASIS) ×1 IMPLANT
KIT BASIN OR (CUSTOM PROCEDURE TRAY) ×2 IMPLANT
NS IRRIG 1000ML POUR BTL (IV SOLUTION) ×2 IMPLANT
PENCIL BUTTON HOLSTER BLD 10FT (ELECTRODE) IMPLANT
SCALPEL HARMONIC ACE (MISCELLANEOUS) ×1 IMPLANT
SCISSORS LAP 5X35 DISP (ENDOMECHANICALS) ×2 IMPLANT
SET IRRIG TUBING LAPAROSCOPIC (IRRIGATION / IRRIGATOR) ×2 IMPLANT
SLEEVE XCEL OPT CAN 5 100 (ENDOMECHANICALS) ×4 IMPLANT
SOLUTION ANTI FOG 6CC (MISCELLANEOUS) ×2 IMPLANT
SPONGE LAP 18X18 X RAY DECT (DISPOSABLE) IMPLANT
SUT MNCRL AB 4-0 PS2 18 (SUTURE) ×2 IMPLANT
SUT PDS AB 4-0 SH 27 (SUTURE) ×4 IMPLANT
TIP INNERVISION DETACH 40FR (MISCELLANEOUS) IMPLANT
TIP INNERVISION DETACH 50FR (MISCELLANEOUS) IMPLANT
TIP INNERVISION DETACH 56FR (MISCELLANEOUS) IMPLANT
TIPS INNERVISION DETACH 40FR (MISCELLANEOUS)
TOWEL OR 17X26 10 PK STRL BLUE (TOWEL DISPOSABLE) ×2 IMPLANT
TRAY FOLEY CATH 14FRSI W/METER (CATHETERS) ×2 IMPLANT
TRAY LAP CHOLE (CUSTOM PROCEDURE TRAY) ×2 IMPLANT
TROCAR BLADELESS OPT 5 100 (ENDOMECHANICALS) ×2 IMPLANT
TROCAR XCEL BLUNT TIP 100MML (ENDOMECHANICALS) ×1 IMPLANT
TROCAR XCEL NON-BLD 11X100MML (ENDOMECHANICALS) ×2 IMPLANT
TROCAR XCEL UNIV SLVE 11M 100M (ENDOMECHANICALS) ×1 IMPLANT
TUBING FILTER THERMOFLATOR (ELECTROSURGICAL) ×1 IMPLANT

## 2012-11-05 NOTE — Anesthesia Preprocedure Evaluation (Addendum)
Anesthesia Evaluation  Patient identified by MRN, date of birth, ID band Patient awake    Reviewed: Allergy & Precautions, H&P , NPO status , Patient's Chart, lab work & pertinent test results  History of Anesthesia Complications (+) PONV  Airway Mallampati: II TM Distance: >3 FB Neck ROM: full    Dental no notable dental hx. (+) Teeth Intact   Pulmonary neg pulmonary ROS,    Pulmonary exam normal       Cardiovascular negative cardio ROS  Rhythm:regular Rate:Normal     Neuro/Psych  Headaches, Anxiety  Neuromuscular disease negative psych ROS   GI/Hepatic hiatal hernia, GERD-  Medicated and Controlled,Achalasia   Endo/Other  negative endocrine ROS  Renal/GU negative Renal ROS  negative genitourinary   Musculoskeletal negative musculoskeletal ROS (+)   Abdominal Normal abdominal exam  (+)   Peds  Hematology negative hematology ROS (+)   Anesthesia Other Findings   Reproductive/Obstetrics negative OB ROS                          Anesthesia Physical Anesthesia Plan  ASA: II  Anesthesia Plan: General   Post-op Pain Management:    Induction: Intravenous  Airway Management Planned: Oral ETT  Additional Equipment:   Intra-op Plan:   Post-operative Plan: Extubation in OR  Informed Consent: I have reviewed the patients History and Physical, chart, labs and discussed the procedure including the risks, benefits and alternatives for the proposed anesthesia with the patient or authorized representative who has indicated his/her understanding and acceptance.   Dental advisory given  Plan Discussed with: CRNA  Anesthesia Plan Comments:         Anesthesia Quick Evaluation

## 2012-11-05 NOTE — Interval H&P Note (Signed)
History and Physical Interval Note:  11/05/2012 11:34 AM  Christy Collins  has presented today for surgery, with the diagnosis of achalasia  The various methods of treatment have been discussed with the patient and family. After consideration of risks, benefits and other options for treatment, the patient has consented to  Procedure(s): LAPAROSCOPIC ESOPHAGEAL MYOTOMY AND ANTERIOR FUNDOPLICATION (N/A) ESOPHAGOGASTRODUODENOSCOPY (EGD) (N/A) as a surgical intervention .  The patient's history has been reviewed, patient examined, no change in status, stable for surgery.  I have reviewed the patient's chart and labs.  Questions were answered to the patient's satisfaction.     Julianne Chamberlin Shela Commons

## 2012-11-05 NOTE — Anesthesia Postprocedure Evaluation (Signed)
Anesthesia Post Note  Patient: Christy Collins  Procedure(s) Performed: Procedure(s) (LRB): LAPAROSCOPIC ESOPHAGEAL MYOTOMY AND ANTERIOR FUNDOPLICATION (N/A) ESOPHAGOGASTRODUODENOSCOPY (EGD) (N/A)  Anesthesia type: General  Patient location: PACU  Post pain: Pain level controlled  Post assessment: Post-op Vital signs reviewed  Last Vitals:  Filed Vitals:   11/05/12 1944  BP: 124/74  Pulse: 78  Temp: 36.9 C  Resp: 14    Post vital signs: Reviewed  Level of consciousness: sedated  Complications: No apparent anesthesia complications

## 2012-11-05 NOTE — Anesthesia Procedure Notes (Signed)
Procedure Name: Intubation Date/Time: 11/05/2012 12:10 PM Performed by: Carmelia Roller R Patient Re-evaluated:Patient Re-evaluated prior to inductionOxygen Delivery Method: Circle system utilized Preoxygenation: Pre-oxygenation with 100% oxygen Intubation Type: IV induction, Cricoid Pressure applied and Rapid sequence Laryngoscope Size: Miller and 2 Grade View: Grade I Tube type: Oral Tube size: 7.0 mm Number of attempts: 1 Airway Equipment and Method: Stylet Placement Confirmation: ETT inserted through vocal cords under direct vision,  breath sounds checked- equal and bilateral and positive ETCO2 Secured at: 21 cm Tube secured with: Tape Dental Injury: Teeth and Oropharynx as per pre-operative assessment

## 2012-11-05 NOTE — H&P (Signed)
Christy Collins is an 42 y.o. female.   Chief Complaint:   Here for elective surgery HPI:   She has a long history of achalasia.  Her symptoms are significantly worsening.  She now presents for repair.    Past Medical History  Diagnosis Date  . Stricture and stenosis of esophagus   . Achalasia   . Constipation   . Noncompliance   . Yeast infection   . Bacterial infection   . Ovarian cyst   . Anemia   . History of chicken pox   . Hiatal hernia   . GERD (gastroesophageal reflux disease)     Chronic   . Hiatal hernia   . PONV (postoperative nausea and vomiting)   . Eczema     Past Surgical History  Procedure Laterality Date  . Tonsillectomy    . Tubal ligation    . Dilation and curettage of uterus    . Breast lumpectomy    . Dilitation & currettage/hystroscopy with versapoint resection  06/08/2012    Procedure: DILATATION & CURETTAGE/HYSTEROSCOPY WITH VERSAPOINT RESECTION;  Surgeon: Esmeralda Arthur, MD;  Location: [facility];  Service: Gynecology;  Laterality: N/A;  and novasure  . Esophageal manometry N/A 09/17/2012    Procedure: ESOPHAGEAL MANOMETRY (EM);  Surgeon: Mardella Layman, MD;  Location: [facility];  Service: Endoscopy;  Laterality: N/A;  . Uterine fibroid surgery      Family History  Problem Relation Age of Onset  . Asthma Mother   . Asthma Daughter   . Cancer Maternal Grandmother   . Cancer Paternal Uncle   . Hypertension Paternal Uncle   . Hypertension Paternal Aunt   . Diabetes Paternal Uncle   . Diabetes Paternal Aunt   . Rheumatic fever Daughter    Social History:  reports that she has been smoking Cigarettes.  She has been smoking about 0.50 packs per day. She has never used smokeless tobacco. She reports that she does not drink alcohol or use illicit drugs.  Allergies:  Allergies  Allergen Reactions  . Other     Patient states that she cant take any pain meds and if she is put to sleep it makes her sick.    Medications Prior to Admission   Medication Sig Dispense Refill  . ibuprofen (ADVIL,MOTRIN) 200 MG tablet Take 400 mg by mouth every 6 (six) hours as needed for pain or headache.        No results found for this or any previous visit (from the past 48 hour(s)). No results found.  Review of Systems  Constitutional: Negative for fever and chills.  Gastrointestinal: Negative for abdominal pain.    Blood pressure 125/77, pulse 72, temperature 98.5 F (36.9 C), temperature source Oral, resp. rate 18, SpO2 100.00%. Physical Exam  Constitutional: No distress.  Thin female.  HENT:  Head: Normocephalic and atraumatic.  Eyes: No scleral icterus.  Cardiovascular: Normal rate and regular rhythm.   Respiratory: Effort normal and breath sounds normal.  GI: Soft. She exhibits no distension. There is no tenderness.  Musculoskeletal: She exhibits no edema.  Lymphadenopathy:    She has no cervical adenopathy.  Skin: Skin is warm and dry.     Assessment/Plan Increasingly symptomatic achalasia  Plan:  Laparoscopic Heller myotomy and Dor fundoplication.  Arleta Ostrum J 11/05/2012, 11:27 AM

## 2012-11-05 NOTE — Op Note (Signed)
Operative Note  Christy Collins female 42 y.o. 11/05/2012  PREOPERATIVE DX:  Achalasia  POSTOPERATIVE DX:  Same  PROCEDURE:  Laparoscopic Heller myotomy, repair of esophageal mucosal tear, and Dor fundoplication, upper endoscopy (by Dr. Ezzard Standing)         Surgeon: Adolph Pollack   Assistants: Dr. Ovidio Kin  Anesthesia: General endotracheal anesthesia  Indications: This is a 42 year old female with long-standing achalasia. Her symptoms are progressively worsening. She now presents for [facility] myotomy.    Procedure Detail:  She was brought to the operating room placed supine on the operating table and a general anesthetic was given. A Foley catheter was inserted. An oral gastric tube was inserted. The abdominal wall was widely sterilely prepped and draped.  A 5 mm incision was made in the left upper quadrant subcostal region. Using a 5 mm Optiview trocar and laparoscope access was gained into the peritoneal cavity and a pneumoperitoneum was created. The area underneath the trocar was inspected and there was no evidence of bleeding or organ injury. An 11 mm trocar was placed just to the left of the umbilicus. A 5 mm trocar was placed in the right upper quadrant area. An 11 mm trocar was placed in the epigastric region. A 5 mm trocar was placed in the lateral left upper quadrant. A 5 mm incision was made in the subxiphoid area. A self-retaining liver retractor was placed into the abdominal cavity. The left lobe of the liver was retracted anteriorly exposing the hiatus.  The thin gastrohepatic ligament was divided up to the level of the right crus. An aberant left hepatic artery was discovered and preserved.  A thin phrenoesophageal ligament was divided with the harmonic scalpel. Using careful blunt dissection a retroesophageal window was created. A Penrose drain was placed through this. The GE junction was in its retracted inferiorly.  Using intermittent electrocautery and careful blunt  dissection I began dividing the longitudinal fibers proximal to the gastroesophageal junction. The circular fibers were then identified and divided bluntly and using electrocautery a distance of 6 cm above the gastroesophageal junction. Thus the longitudinal and circular fibers were divided a distance of 6 cm above the gastroesophageal junction. The esophageal muscle was retracted and 180 myotomy was noted. The myotomy was then carried across the gastroesophageal junction and 2 cm onto the stomach. Longitudinal and circular fibers were divided. While doing this a small tear occurred in the gastric mucosa. This was repaired with interrupted 4-0 PDS sutures. Tisseal was also applied.  Total length of the myotomy was 8-9 cm. Upper endoscopy was performed by Dr. Ezzard Standing. The gastroesophageal junction was widely patent. The area of repair was placed under water and was air and watertight. No other esophageal tears or gastric tears were noted.  Next, short gastric vessels at the proximal fundus area were divided. This allowed for mobilization of the fundus. A Dor fundoplication was then performed.` Using 0 interrupted sutures I began the fundoplication on the left side by including the stomach, left crus, and cut muscle fibers.  `2 other sutures were done inferior to this just including the stomach and cut fibers of the muscle. The stomach was then placed anterior to the myotomy. The stomach was then sewn to the right crus and the muscle fibers with 3 separate sutures. A fourth suture approximated the stomach to the cut muscle fibers. This provided with a nice tension-free anterior fundoplication. This also covered the gastric mucosa repair.  Following this four-quadrant inspection was performed as well as  inspection around the stomach and liver. There is no evidence of bleeding or organ injury. The liver retractor was removed. The area was irrigated and fluid evacuated. The trochars were then removed and the  pneumoperitoneum was released.  The skin incisions were closed with 4-0 Monocryl subcuticular stitches. Steri-Strips and sterile dressings were applied.  She tolerated the procedure well and was taken to the recovery room in satisfactory condition.  Estimated Blood Loss:  150 ml         Drains: none  Blood Given: none          Specimens: none        Complications:  * No complications entered in OR log *         Disposition: PACU - hemodynamically stable.         Condition: stable

## 2012-11-05 NOTE — Transfer of Care (Signed)
Immediate Anesthesia Transfer of Care Note  Patient: Christy Collins  Procedure(s) Performed: Procedure(s): LAPAROSCOPIC ESOPHAGEAL MYOTOMY AND ANTERIOR FUNDOPLICATION (N/A) ESOPHAGOGASTRODUODENOSCOPY (EGD) (N/A)  Patient Location: PACU  Anesthesia Type:General  Level of Consciousness: sedated  Airway & Oxygen Therapy: Patient Spontanous Breathing and Patient connected to face mask oxygen  Post-op Assessment: Report given to PACU RN and Post -op Vital signs reviewed and stable  Post vital signs: Reviewed and stable  Complications: No apparent anesthesia complications

## 2012-11-06 ENCOUNTER — Encounter (HOSPITAL_COMMUNITY): Payer: Self-pay | Admitting: General Surgery

## 2012-11-06 NOTE — Progress Notes (Signed)
INITIAL NUTRITION ASSESSMENT  Pt meets criteria for moderate MALNUTRITION in the context of chronic illness as evidenced by visible mild/moderate muscle wasting and subcutaneous fat loss in upper arms, hands, and clavicles.  BMI of 17 meets criteria for underweight.  DOCUMENTATION CODES Per approved criteria  -Non-severe (moderate) malnutrition in the context of chronic illness -Underweight   INTERVENTION: - Discussed post-op diet for Heller myotomy and provided handout of this information. Encouraged small frequent soft low fiber meals, the use of nutritional supplements for additional nutrition, and discouraged use of straws and consumption of carbonated beverages. - Diet advancement per MD - Will continue to monitor   NUTRITION DIAGNOSIS: Inadequate oral intake related to inability to eat as evidenced by NPO.   Goal: Advance diet as tolerated to low fiber diet  Monitor:  Weights, labs, diet advancement  Reason for Assessment: Underweight  42 y.o. female  Admitting Dx: Achalasia and cardiospasm  ASSESSMENT: Pt admitted with elective surgery for achalasia and had laparoscopic Heller myotomy, repair of esophageal mucosal tear, and Dor fundoplication, upper endoscopy completed yesterday. Met with pt who reports eating 2-3 meals/day but would sometimes vomit them up. Pt reports she would try to eat more frequently to get more calories in. Pt reports weight has been stable. Pt not on any nutritional supplements PTA but plans to start drinking them at discharge. Pt mostly underneath blankets but observed mild/moderate muscle wasting and subcutaneous fat loss in arms, hands, and clavicles.    Height: Ht Readings from Last 1 Encounters:  11/05/12 5' 3.5" (1.613 m)    Weight: Wt Readings from Last 1 Encounters:  11/05/12 99 lb (44.906 kg)    Ideal Body Weight: 115 lb  % Ideal Body Weight: 86  Wt Readings from Last 10 Encounters:  11/05/12 99 lb (44.906 kg)  11/05/12 99 lb  (44.906 kg)  10/29/12 101 lb 6 oz (45.983 kg)  09/10/12 101 lb (45.813 kg)  08/31/12 102 lb (46.267 kg)  06/12/12 101 lb (45.813 kg)  05/28/12 100 lb (45.36 kg)  05/28/12 100 lb (45.36 kg)  04/26/12 101 lb (45.813 kg)  04/06/12 100 lb (45.36 kg)    Usual Body Weight: 99 lb per pt  % Usual Body Weight: 100  BMI:  Body mass index is 17.26 kg/(m^2).  Estimated Nutritional Needs: Kcal: 1350-1575 Protein: 70-85g Fluid: 1.3-1.5L/day  Skin: Abdominal incision  Diet Order: NPO  EDUCATION NEEDS: -Education needs addressed - discussed diet therapy for Heller myotomy    Intake/Output Summary (Last 24 hours) at 11/06/12 1521 Last data filed at 11/06/12 1500  Gross per 24 hour  Intake 2250.83 ml  Output   4250 ml  Net -1999.17 ml    Last BM: 4/27  Labs:  No results found for this basename: NA, K, CL, CO2, BUN, CREATININE, CALCIUM, MG, PHOS, GLUCOSE,  in the last 168 hours  CBG (last 3)  No results found for this basename: GLUCAP,  in the last 72 hours  Scheduled Meds: . pantoprazole (PROTONIX) IV  40 mg Intravenous Q24H    Continuous Infusions: . dextrose 5% lactated ringers with KCl 20 mEq/L 75 mL/hr at 11/06/12 0930    Past Medical History  Diagnosis Date  . Stricture and stenosis of esophagus   . Achalasia   . Constipation   . Noncompliance   . Yeast infection   . Bacterial infection   . Ovarian cyst   . Anemia   . History of chicken pox   . Hiatal hernia   .  GERD (gastroesophageal reflux disease)     Chronic   . Hiatal hernia   . PONV (postoperative nausea and vomiting)   . Eczema     Past Surgical History  Procedure Laterality Date  . Tonsillectomy    . Tubal ligation    . Dilation and curettage of uterus    . Breast lumpectomy    . Dilitation & currettage/hystroscopy with versapoint resection  06/08/2012    Procedure: DILATATION & CURETTAGE/HYSTEROSCOPY WITH VERSAPOINT RESECTION;  Surgeon: Esmeralda Arthur, MD;  Location: [facility];  Service:  Gynecology;  Laterality: N/A;  and novasure  . Esophageal manometry N/A 09/17/2012    Procedure: ESOPHAGEAL MANOMETRY (EM);  Surgeon: Mardella Layman, MD;  Location: [facility];  Service: Endoscopy;  Laterality: N/A;  . Uterine fibroid surgery    . Heller myotomy N/A 11/05/2012    Procedure: LAPAROSCOPIC ESOPHAGEAL MYOTOMY AND ANTERIOR FUNDOPLICATION;  Surgeon: Adolph Pollack, MD;  Location: [facility];  Service: General;  Laterality: N/A;  . Esophagogastroduodenoscopy N/A 11/05/2012    Procedure: ESOPHAGOGASTRODUODENOSCOPY (EGD);  Surgeon: Adolph Pollack, MD;  Location: [facility];  Service: General;  Laterality: N/A;     Levon Hedger MS, RD, LDN [phone] Pager [phone] After Hours Pager

## 2012-11-06 NOTE — Progress Notes (Signed)
I took this at 10.00am i put in wrong time space sorry

## 2012-11-06 NOTE — Progress Notes (Signed)
Patient becomes nauseated with any movement.  Antiemetics given as ordered.  Patient unable to dangle this evening of surgery due to nausea

## 2012-11-06 NOTE — Progress Notes (Signed)
Spoke to Dr. Abbey Chatters, that patient concerned about morphine, that it makes her nauseated. MD states to give with zofran and see if that works for nausea, if not page him.

## 2012-11-06 NOTE — Op Note (Addendum)
[name], [name]               ACCOUNT NO.:  [id number]  MEDICAL RECORD NO.:  [id number]  LOCATION:  1540                         FACILITY:  [facility]  PHYSICIAN:  Sandria Bales. Ezzard Standing, M.D.  DATE OF BIRTH:  [birth year]  DATE OF PROCEDURE:  11/05/2012                              OPERATIVE REPORT   PREOPERATIVE DIAGNOSIS:  Achalasia, status post laparoscopic Heller myotomy.  POSTOPERATIVE DIAGNOSIS:  Achalasia, status post laparoscopic Heller myotomy.  PROCEDURE:  Esophagogastroscopy.  SURGEON:  Sandria Bales. Ezzard Standing, M.D.  ASSISTANT:  No first assistant.  ANESTHESIA:  General endotracheal.  ESTIMATED BLOOD LOSS:  None.  INDICATION FOR PROCEDURE:  [name] is a 42 year old African American female who has had achalasia.  She has undergone a laparoscopic Heller myotomy by Dr. Avel Peace.  She had a 1 cm injury around the level of the EG junction which was oversewn by Dr. Abbey Chatters.  The endoscopy is for 2 reasons: #1, to evaluate the adequacy of the Heller myotomy and #2, to evaluate the oversew of the injury around the EG junction.  OPERATIVE NOTE: The patient was intubated and asleep in room #11 at [facility].  Dr. Abbey Chatters was manning the laparoscope during the procedure.    A flexible Pentax adult endoscope was passed down the back of throat and into the esophagus without difficulty.  I identified the beginning of the Heller myotomy starting about 6 cm above the esophagogastric junction.  The esophagogastric junction was widely patent to about 1.5 cm diameter.  The esophagogastric junction was at 39 cm.  Immediately beyond the esophagogastric junction there was evidence of the repair by Dr. Abbey Chatters.  This is on the stomach side of the myotomy, not the esophagus side.  With the stomach insufflated, Dr. Abbey Chatters flooded the upper abdomen with saline.  There was no evidence of bubbling or leak.  I then decompressed the stomach.  It looked like the myotomy went down about 2 cm onto the stomach  side beyond the esophago-gastric junction.  I then decompressed the stomach and withdrew the endoscope.  The esophagus was normal appearing, except the area of the myotomy.  Then Dr. Abbey Chatters completed the procedure.  The patient tolerated the procedure well.   Dr. Abbey Chatters will dictate the remainder of the [facility] myotomy.   Sandria Bales. Ezzard Standing, M.D., FACS   DHN/MEDQ  D:  11/05/2012  T:  11/06/2012  Job:  [id number]

## 2012-11-06 NOTE — Progress Notes (Signed)
1 Day Post-Op  Subjective: Severe nausea over night.  She c/o foley bothering her.  No significant pain.  Objective: Vital signs in last 24 hours: Temp:  [97.5 F (36.4 C)-99 F (37.2 C)] 98.7 F (37.1 C) (04/29 0500) Pulse Rate:  [62-98] 62 (04/29 0500) Resp:  [12-20] 16 (04/29 0500) BP: (110-146)/(64-87) 116/71 mmHg (04/29 0500) SpO2:  [99 %-100 %] 100 % (04/29 0500) Weight:  [99 lb (44.906 kg)] 99 lb (44.906 kg) (04/28 1741) Last BM Date: 11/04/12  Intake/Output from previous day: 04/28 0701 - 04/29 0700 In: 3568.3 [I.V.:3418.3; IV Piggyback:150] Out: 3250 [Urine:3200; Blood:50] Intake/Output this shift:    PE: General- In NAD Abdomen-sort, flat, dressings dry.  Lab Results:  No results found for this basename: WBC, HGB, HCT, PLT,  in the last 72 hours BMET No results found for this basename: NA, K, CL, CO2, GLUCOSE, BUN, CREATININE, CALCIUM,  in the last 72 hours PT/INR No results found for this basename: LABPROT, INR,  in the last 72 hours Comprehensive Metabolic Panel:    Component Value Date/Time   NA 141 10/29/2012 1425   K 3.7 10/29/2012 1425   CL 104 10/29/2012 1425   CO2 28 10/29/2012 1425   BUN 8 10/29/2012 1425   CREATININE 0.78 10/29/2012 1425   GLUCOSE 93 10/29/2012 1425   CALCIUM 9.2 10/29/2012 1425   AST 18 10/29/2012 1425   ALT 9 10/29/2012 1425   ALKPHOS 22* 10/29/2012 1425   BILITOT 0.3 10/29/2012 1425   PROT 6.9 10/29/2012 1425   ALBUMIN 3.8 10/29/2012 1425     Studies/Results: No results found.  Anti-infectives: Anti-infectives   Start     Dose/Rate Route Frequency Ordered Stop   11/05/12 1800  ceFAZolin (ANCEF) IVPB 1 g/50 mL premix     1 g 100 mL/hr over 30 Minutes Intravenous Every 6 hours 11/05/12 1646 11/06/12 0555   11/05/12 1012  ceFAZolin (ANCEF) IVPB 2 g/50 mL premix     2 g 100 mL/hr over 30 Minutes Intravenous On call to O.R. 11/05/12 1012 11/05/12 1215      Assessment Principal Problem:   ACHALASIA s/p laparoscopic Heller  myotomy and Dor Fundoplication and repair of gastric mucosal tear-has had significant nausea.    LOS: 1 day   Plan: Remove foley.  Keep NPO.   Janneth Krasner J 11/06/2012

## 2012-11-06 NOTE — Care Management Note (Signed)
    Page 1 of 1   11/06/2012     11:15:29 AM   CARE MANAGEMENT NOTE 11/06/2012  Patient:  Christy Collins, Christy Collins   Account Number:  [id number]  Date Initiated:  11/06/2012  Documentation initiated by:  Lorenda Ishihara  Subjective/Objective Assessment:   42 yo female admitted s/p lap heller myotomy and DOR fundoplication.     Action/Plan:   Home when stable   Anticipated DC Date:  11/10/2012   Anticipated DC Plan:  HOME/SELF CARE      DC Planning Services  CM consult      Choice offered to / List presented to:             Status of service:  Completed, signed off Medicare Important Message given?   (If response is "NO", the following Medicare IM given date fields will be blank) Date Medicare IM given:   Date Additional Medicare IM given:    Discharge Disposition:  HOME/SELF CARE  Per UR Regulation:  Reviewed for med. necessity/level of care/duration of stay  If discussed at Long Length of Stay Meetings, dates discussed:    Comments:

## 2012-11-07 MED ORDER — PANTOPRAZOLE SODIUM 40 MG IV SOLR
40.0000 mg | Freq: Once | INTRAVENOUS | Status: AC
  Administered 2012-11-07: 40 mg via INTRAVENOUS
  Filled 2012-11-07: qty 40

## 2012-11-07 MED ORDER — ACETAMINOPHEN 10 MG/ML IV SOLN
1000.0000 mg | Freq: Four times a day (QID) | INTRAVENOUS | Status: AC
  Administered 2012-11-07 – 2012-11-08 (×4): 1000 mg via INTRAVENOUS
  Filled 2012-11-07 (×5): qty 100

## 2012-11-07 MED ORDER — BIOTENE DRY MOUTH MT LIQD
15.0000 mL | Freq: Two times a day (BID) | OROMUCOSAL | Status: DC
  Administered 2012-11-07 – 2012-11-08 (×3): 15 mL via OROMUCOSAL

## 2012-11-07 MED ORDER — CHLORHEXIDINE GLUCONATE 0.12 % MT SOLN
15.0000 mL | Freq: Two times a day (BID) | OROMUCOSAL | Status: DC
  Administered 2012-11-07 – 2012-11-09 (×3): 15 mL via OROMUCOSAL
  Filled 2012-11-07 (×7): qty 15

## 2012-11-07 MED ORDER — PNEUMOCOCCAL VAC POLYVALENT 25 MCG/0.5ML IJ INJ
0.5000 mL | INJECTION | INTRAMUSCULAR | Status: AC
  Filled 2012-11-07 (×2): qty 0.5

## 2012-11-07 NOTE — Progress Notes (Signed)
2 Days Post-Op  Subjective: She c/o incisional soreness and headache.  She is hungry.  No nausea.   Objective: Vital signs in last 24 hours: Temp:  [98.7 F (37.1 C)-99.2 F (37.3 C)] 99.2 F (37.3 C) (04/30 0535) Pulse Rate:  [67-85] 77 (04/30 0535) Resp:  [14-20] 16 (04/30 0535) BP: (119-170)/(64-105) 126/68 mmHg (04/30 0535) SpO2:  [95 %-100 %] 99 % (04/30 0535) Last BM Date: 11/04/12  Intake/Output from previous day: 04/29 0701 - 04/30 0700 In: 982.5 [I.V.:982.5] Out: 2200 [Urine:2200] Intake/Output this shift:    PE: General- In NAD Abdomen-sort, flat, dressings dry, active bowel sounds  Lab Results:  No results found for this basename: WBC, HGB, HCT, PLT,  in the last 72 hours BMET No results found for this basename: NA, K, CL, CO2, GLUCOSE, BUN, CREATININE, CALCIUM,  in the last 72 hours PT/INR No results found for this basename: LABPROT, INR,  in the last 72 hours Comprehensive Metabolic Panel:    Component Value Date/Time   NA 141 10/29/2012 1425   K 3.7 10/29/2012 1425   CL 104 10/29/2012 1425   CO2 28 10/29/2012 1425   BUN 8 10/29/2012 1425   CREATININE 0.78 10/29/2012 1425   GLUCOSE 93 10/29/2012 1425   CALCIUM 9.2 10/29/2012 1425   AST 18 10/29/2012 1425   ALT 9 10/29/2012 1425   ALKPHOS 22* 10/29/2012 1425   BILITOT 0.3 10/29/2012 1425   PROT 6.9 10/29/2012 1425   ALBUMIN 3.8 10/29/2012 1425     Studies/Results: No results found.  Anti-infectives: Anti-infectives   Start     Dose/Rate Route Frequency Ordered Stop   11/05/12 1800  ceFAZolin (ANCEF) IVPB 1 g/50 mL premix     1 g 100 mL/hr over 30 Minutes Intravenous Every 6 hours 11/05/12 1646 11/06/12 0555   11/05/12 1012  ceFAZolin (ANCEF) IVPB 2 g/50 mL premix     2 g 100 mL/hr over 30 Minutes Intravenous On call to O.R. 11/05/12 1012 11/05/12 1215      Assessment Principal Problem:   ACHALASIA s/p laparoscopic Heller myotomy and Dor Fundoplication and repair of gastric mucosal tear-nausea is  better.  Has a headache.    LOS: 2 days   Plan: IV Tylenol for headache.  Check UGI tomorrow.   Christy Collins J 11/07/2012

## 2012-11-08 ENCOUNTER — Inpatient Hospital Stay (HOSPITAL_COMMUNITY): Payer: BC Managed Care – PPO

## 2012-11-08 MED ORDER — IOHEXOL 300 MG/ML  SOLN
50.0000 mL | Freq: Once | INTRAMUSCULAR | Status: AC | PRN
  Administered 2012-11-08: 50 mL via ORAL

## 2012-11-08 MED ORDER — PANTOPRAZOLE SODIUM 40 MG PO TBEC
40.0000 mg | DELAYED_RELEASE_TABLET | Freq: Two times a day (BID) | ORAL | Status: DC
  Administered 2012-11-08 – 2012-11-09 (×2): 40 mg via ORAL
  Filled 2012-11-08 (×3): qty 1

## 2012-11-08 NOTE — Progress Notes (Signed)
3 Days Post-Op  Subjective: No complaints.   Objective: Vital signs in last 24 hours: Temp:  [98.4 F (36.9 C)-99 F (37.2 C)] 99 F (37.2 C) (05/01 0527) Pulse Rate:  [60-79] 63 (05/01 0527) Resp:  [16-20] 16 (05/01 0527) BP: (104-128)/(54-83) 104/54 mmHg (05/01 0527) SpO2:  [100 %] 100 % (05/01 0527) Last BM Date: 11/04/12  Intake/Output from previous day: 04/30 0701 - 05/01 0700 In: 2656.3 [I.V.:2656.3] Out: 2100 [Urine:2100] Intake/Output this shift:    PE: General- In NAD Abdomen-sort, flat, incisions are clean and intact, active bowel sounds  Lab Results:  No results found for this basename: WBC, HGB, HCT, PLT,  in the last 72 hours BMET No results found for this basename: NA, K, CL, CO2, GLUCOSE, BUN, CREATININE, CALCIUM,  in the last 72 hours PT/INR No results found for this basename: LABPROT, INR,  in the last 72 hours Comprehensive Metabolic Panel:    Component Value Date/Time   NA 141 10/29/2012 1425   K 3.7 10/29/2012 1425   CL 104 10/29/2012 1425   CO2 28 10/29/2012 1425   BUN 8 10/29/2012 1425   CREATININE 0.78 10/29/2012 1425   GLUCOSE 93 10/29/2012 1425   CALCIUM 9.2 10/29/2012 1425   AST 18 10/29/2012 1425   ALT 9 10/29/2012 1425   ALKPHOS 22* 10/29/2012 1425   BILITOT 0.3 10/29/2012 1425   PROT 6.9 10/29/2012 1425   ALBUMIN 3.8 10/29/2012 1425     Studies/Results: No results found.  Anti-infectives: Anti-infectives   Start     Dose/Rate Route Frequency Ordered Stop   11/05/12 1800  ceFAZolin (ANCEF) IVPB 1 g/50 mL premix     1 g 100 mL/hr over 30 Minutes Intravenous Every 6 hours 11/05/12 1646 11/06/12 0555   11/05/12 1012  ceFAZolin (ANCEF) IVPB 2 g/50 mL premix     2 g 100 mL/hr over 30 Minutes Intravenous On call to O.R. 11/05/12 1012 11/05/12 1215      Assessment Principal Problem:   ACHALASIA s/p laparoscopic Heller myotomy and Dor Fundoplication and repair of gastric mucosal tear-feeling better.    LOS: 3 days   Plan: UGI today.   Liquid diet if UGI okay.   Christy Collins J 11/08/2012

## 2012-11-08 NOTE — Progress Notes (Signed)
UGI demonstrates no leak.  Will start a full liquid diet.

## 2012-11-09 MED ORDER — OMEPRAZOLE 20 MG PO CPDR: 20.0000 mg | DELAYED_RELEASE_CAPSULE | Freq: Two times a day (BID) | ORAL | Status: DC

## 2012-11-09 NOTE — Progress Notes (Signed)
4 Days Post-Op  Subjective: Some bloating after eating.  Swallowing well.   Objective: Vital signs in last 24 hours: Temp:  [97.7 F (36.5 C)-98.8 F (37.1 C)] 98.8 F (37.1 C) (05/02 0647) Pulse Rate:  [64-80] 64 (05/02 0647) Resp:  [16] 16 (05/02 0647) BP: (113-128)/(72-76) 127/76 mmHg (05/02 0647) SpO2:  [97 %-100 %] 97 % (05/02 0647) Last BM Date: 11/04/12  Intake/Output from previous day: 05/01 0701 - 05/02 0700 In: 2620 [P.O.:720; I.V.:1900] Out: 1500 [Urine:1500] Intake/Output this shift:    PE: General- In NAD Abdomen-soft, flat, incisions are clean and intact  Lab Results:  No results found for this basename: WBC, HGB, HCT, PLT,  in the last 72 hours BMET No results found for this basename: NA, K, CL, CO2, GLUCOSE, BUN, CREATININE, CALCIUM,  in the last 72 hours PT/INR No results found for this basename: LABPROT, INR,  in the last 72 hours Comprehensive Metabolic Panel:    Component Value Date/Time   NA 141 10/29/2012 1425   K 3.7 10/29/2012 1425   CL 104 10/29/2012 1425   CO2 28 10/29/2012 1425   BUN 8 10/29/2012 1425   CREATININE 0.78 10/29/2012 1425   GLUCOSE 93 10/29/2012 1425   CALCIUM 9.2 10/29/2012 1425   AST 18 10/29/2012 1425   ALT 9 10/29/2012 1425   ALKPHOS 22* 10/29/2012 1425   BILITOT 0.3 10/29/2012 1425   PROT 6.9 10/29/2012 1425   ALBUMIN 3.8 10/29/2012 1425     Studies/Results: Dg Ugi W/water Sol Cm  11/08/2012  *RADIOLOGY REPORT*  Clinical Data:Postoperative for Heller myotomy and Dor fundoplication to treat achalasia  WATER SOLUBLE ESOPHAGRAM  Fluoroscopy Time: 2 minutes 27 seconds  Comparison: 09/05/2012  Findings: Omnipaque-300 contrast medium was administered orally to the patient with fluoroscopic spot film visualization.  Upper esophagus unremarkable.  No leak observed in the vicinity of the myotomy or fundoplication.  The distal esophagus is currently narrowed in the area of the fundoplication.  The most but I could distend the lumen up to in  this vicinity was 5 mm.  IMPRESSION:  1.  Interval Heller myotomy and Dor fundoplication.  There is smooth narrowing of the distal esophagus in the vicinity of the fundoplication, with current maximum luminal diameter of 5 mm based on individual swallows.  No leak observed.   Original Report Authenticated By: Gaylyn Rong, M.D.     Anti-infectives: Anti-infectives   Start     Dose/Rate Route Frequency Ordered Stop   11/05/12 1800  ceFAZolin (ANCEF) IVPB 1 g/50 mL premix     1 g 100 mL/hr over 30 Minutes Intravenous Every 6 hours 11/05/12 1646 11/06/12 0555   11/05/12 1012  ceFAZolin (ANCEF) IVPB 2 g/50 mL premix     2 g 100 mL/hr over 30 Minutes Intravenous On call to O.R. 11/05/12 1012 11/05/12 1215      Assessment Principal Problem:   ACHALASIA s/p laparoscopic Heller myotomy and Dor Fundoplication and repair of gastric mucosal tear-swallowing well; tolerating full liquid diet.    LOS: 4 days   Plan: Discharge today on Level 1 diet.  Instructions given.   Christy Collins 11/09/2012

## 2012-11-16 ENCOUNTER — Telehealth (INDEPENDENT_AMBULATORY_CARE_PROVIDER_SITE_OTHER): Payer: Self-pay

## 2012-11-16 NOTE — Telephone Encounter (Signed)
Pt called stating she was not sure how long she was to stay on liquid diet so she started soft diet on her own and wants to increase to full diet. Pt advised not to increase from soft diet until she has come to office next week for her po appt with MD. Pt also states she d/c her prilosec on her own because it made her stomach ache. I advised pt of importance of following her d/c instructions given to her by Dr Abbey Chatters. Pt advised I will send this msg to Dr Abbey Chatters for review and see if he has any recommendations for medication that could be tried other than prilosec. Pt states mallox helps her more that prilosec. Pt advised again importance of following MD recommendations.

## 2012-11-16 NOTE — Telephone Encounter (Signed)
She needs to stay on a Level 1 diet and not advance the day.  She needs to continue to take her Prilosec daily.

## 2012-11-19 NOTE — Telephone Encounter (Signed)
Close encounter 

## 2012-11-20 NOTE — Telephone Encounter (Signed)
Pt contacted and advised to stay on Level 1 diet until instructed otherwise by Dr. Abbey Chatters.  She also needs to take her Prilosec daily.

## 2012-11-22 ENCOUNTER — Encounter (INDEPENDENT_AMBULATORY_CARE_PROVIDER_SITE_OTHER): Payer: Self-pay | Admitting: General Surgery

## 2012-11-22 ENCOUNTER — Ambulatory Visit (INDEPENDENT_AMBULATORY_CARE_PROVIDER_SITE_OTHER): Payer: BC Managed Care – PPO | Admitting: General Surgery

## 2012-11-22 VITALS — BP 118/80 | HR 84 | Ht 65.0 in | Wt 97.2 lb

## 2012-11-22 DIAGNOSIS — Z9889 Other specified postprocedural states: Secondary | ICD-10-CM

## 2012-11-22 NOTE — Patient Instructions (Signed)
Advanced to a soft diet. Do not see any hard foods, things that crunch, or bread products. Date try Alka-Seltzer for indigestion. Tried Pepcid  AC twice a day for indigestion. Continue light activities.

## 2012-11-22 NOTE — Progress Notes (Signed)
Procedure:  Laparoscopic Heller myotomy and Dor fundoplication  Date:  11/05/2012  Pathology: na  History:  She is here for her first postoperative visit. She is swallowing well. She has advanced her diet. She's having some indigestion not relieved by Prilosec.  Exam: General- Is in NAD. Abdomen-incisions are clean and intact.\  Assessment:  Doing well postoperatively.  Plan:  Advanced to soft diet. Avoid bread products. Light activities. Return visit 3-4 weeks.

## 2012-11-22 NOTE — Discharge Summary (Signed)
Physician Discharge Summary  Patient ID: Christy Collins MRN: [id number] DOB/AGE: 1971/01/22 42 y.o.  Admit date: 11/05/2012 Discharge date: 11/09/2012  Admission Diagnoses:  Achalasia  Discharge Diagnoses:  Principal Problem:   ACHALASIA s/p laparoscopic Heller myotomy and Dor Fundoplication   Discharged Condition: good  Hospital Course: She underwent the above procedure as well as repair of a small gastric mucosal tear. She was kept n.p.o. For 3 days then underwent an upper GI study which showed good passage of contrast into the stomach. No evidence of leak. She was started on a full liquid diet and tolerated this well. Her incisions were healing well. She was able to be discharged on 11/09/2012. She was given specific discharge instructions.  Consults: None  Significant Diagnostic Studies: Upper GI  Treatments: surgery: Laparoscopic Heller myotomy with Dor fundoplication and repair of gastric mucosa  Discharge Exam: Blood pressure 127/76, pulse 64, temperature 98.8 F (37.1 C), temperature source Oral, resp. rate 16, height 5' 3.5" (1.613 m), weight 99 lb (44.906 kg), last menstrual period 10/23/2012, SpO2 97.00%.   Disposition: 01-Home or Self Care     Medication List    STOP taking these medications       ibuprofen 200 MG tablet  Commonly known as:  ADVIL,MOTRIN      TAKE these medications       omeprazole 20 MG capsule  Commonly known as:  PRILOSEC  Take 1 capsule (20 mg total) by mouth 2 (two) times daily.         Signed: Adolph Pollack 11/22/2012, 10:44 AM

## 2013-03-29 ENCOUNTER — Ambulatory Visit (INDEPENDENT_AMBULATORY_CARE_PROVIDER_SITE_OTHER): Payer: BC Managed Care – PPO | Admitting: General Surgery

## 2013-03-29 ENCOUNTER — Encounter (INDEPENDENT_AMBULATORY_CARE_PROVIDER_SITE_OTHER): Payer: Self-pay | Admitting: General Surgery

## 2013-03-29 VITALS — BP 132/86 | HR 68 | Resp 16 | Ht 63.5 in | Wt 105.0 lb

## 2013-03-29 DIAGNOSIS — K220 Achalasia of cardia: Secondary | ICD-10-CM

## 2013-03-29 NOTE — Patient Instructions (Signed)
Avoid the foods that seem to be getting stuck. Take Prilosec every day at 5 PM. If your throat does not feel better after 30 days, please call back.

## 2013-03-29 NOTE — Progress Notes (Signed)
Subjective:     Patient ID: Christy Collins, female   DOB: [birth year], 42 y.o.   MRN: [id number]  HPI  She is here for long-term followup visit. She missed her second postoperative visit. She is better than she was preoperatively. However some foods, such as bread and donuts and raw vegetables, feel like they get stuck when she swallows. She also is complaining of some irritation of her throat. She denies heartburn. She has noticed a small lump by her epigastric incision.   Review of Systems     Objective:   Physical Exam Gen.-11 female in no acute distress.  Abdomen-soft, incisions clean and intact, slight bulge by medial aspect of the small epigastric incision is partially reducible.    Assessment:     Having some mild dysphagia to certain foods after laparoscopic Heller myotomy. Has a small incisional hernia.     Plan:     I've instructed her to avoid the foods cause this type of mild dysphagia.  I recommended she take a Prilosec every day at 5 PM. If her symptoms are not better in 30 days, I have asked her to call back. As for the small incisional hernia, if this becomes larger or symptomatic I told her call back and we could talk about repair.

## 2013-06-02 IMAGING — CR DG CHEST 2V
2 series · 2 of 2 positions shown · non-contrast
Comparison: None

CLINICAL DATA: Chest pain

CHEST - 2 VIEW

[view not recorded (1 of 2)]
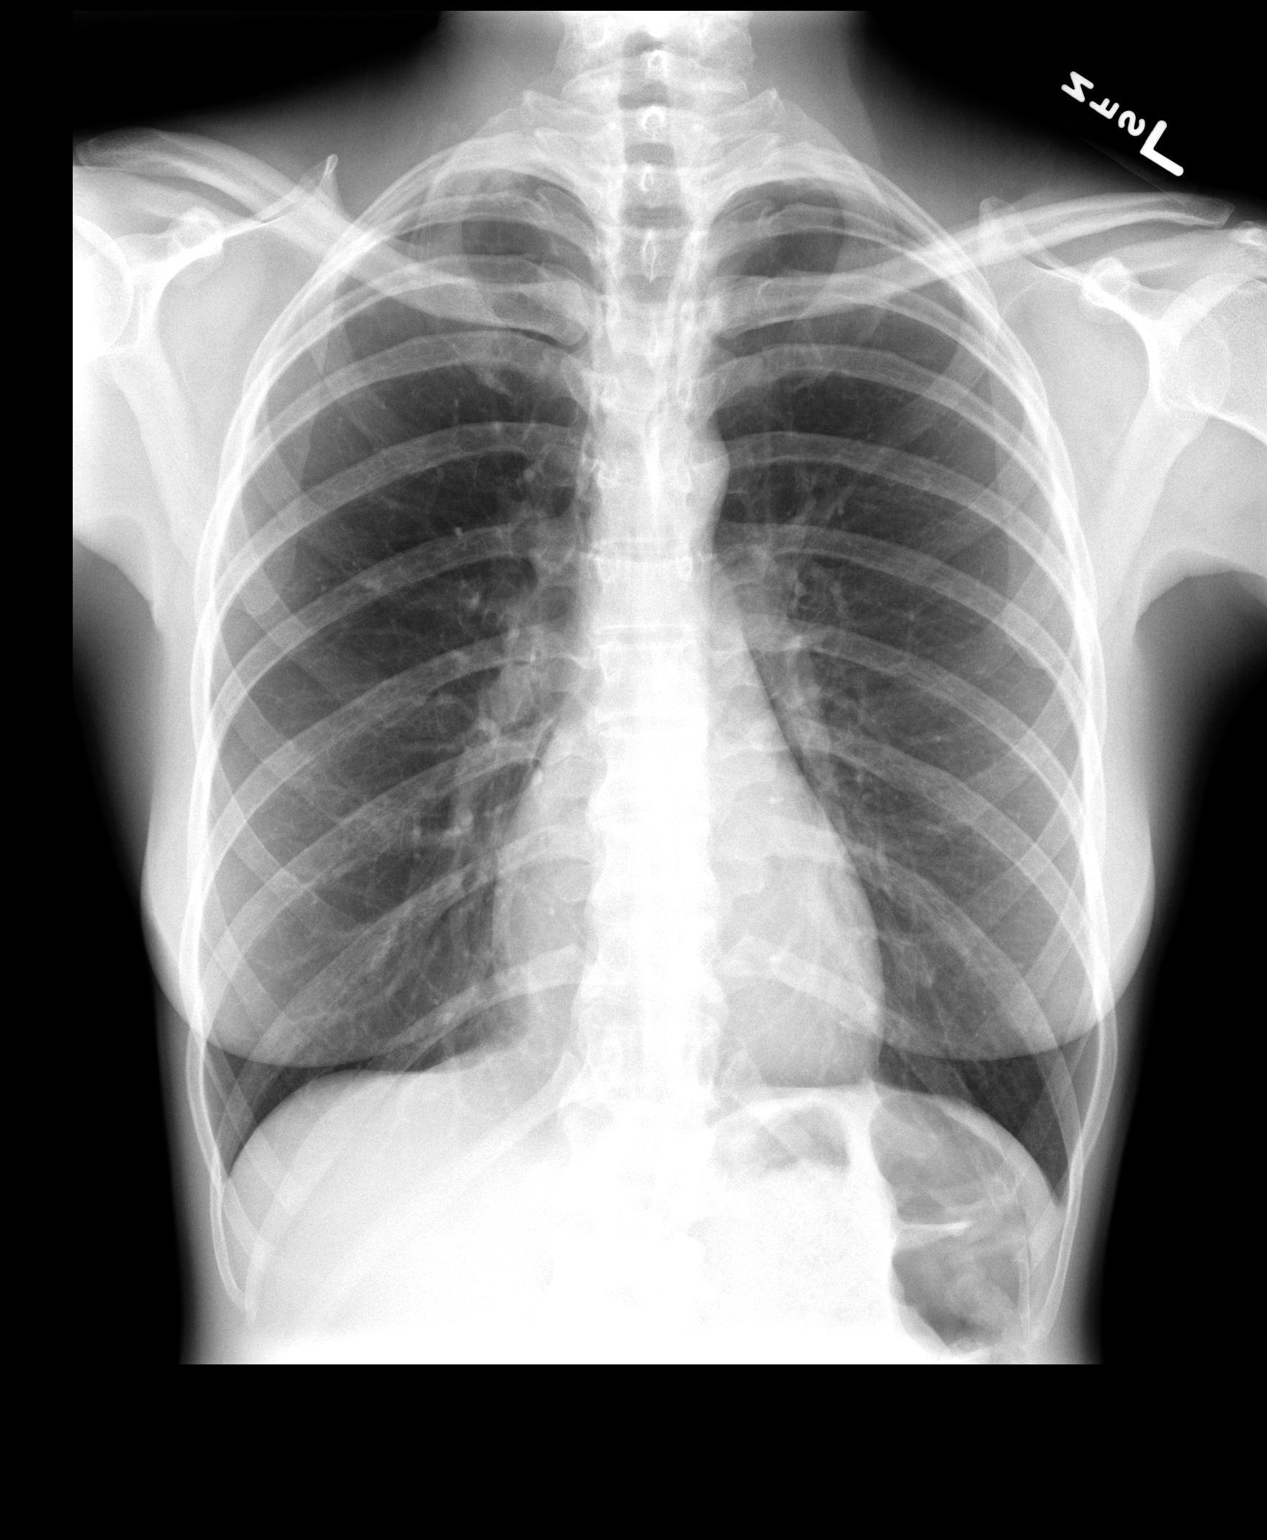

[view not recorded (2 of 2)]
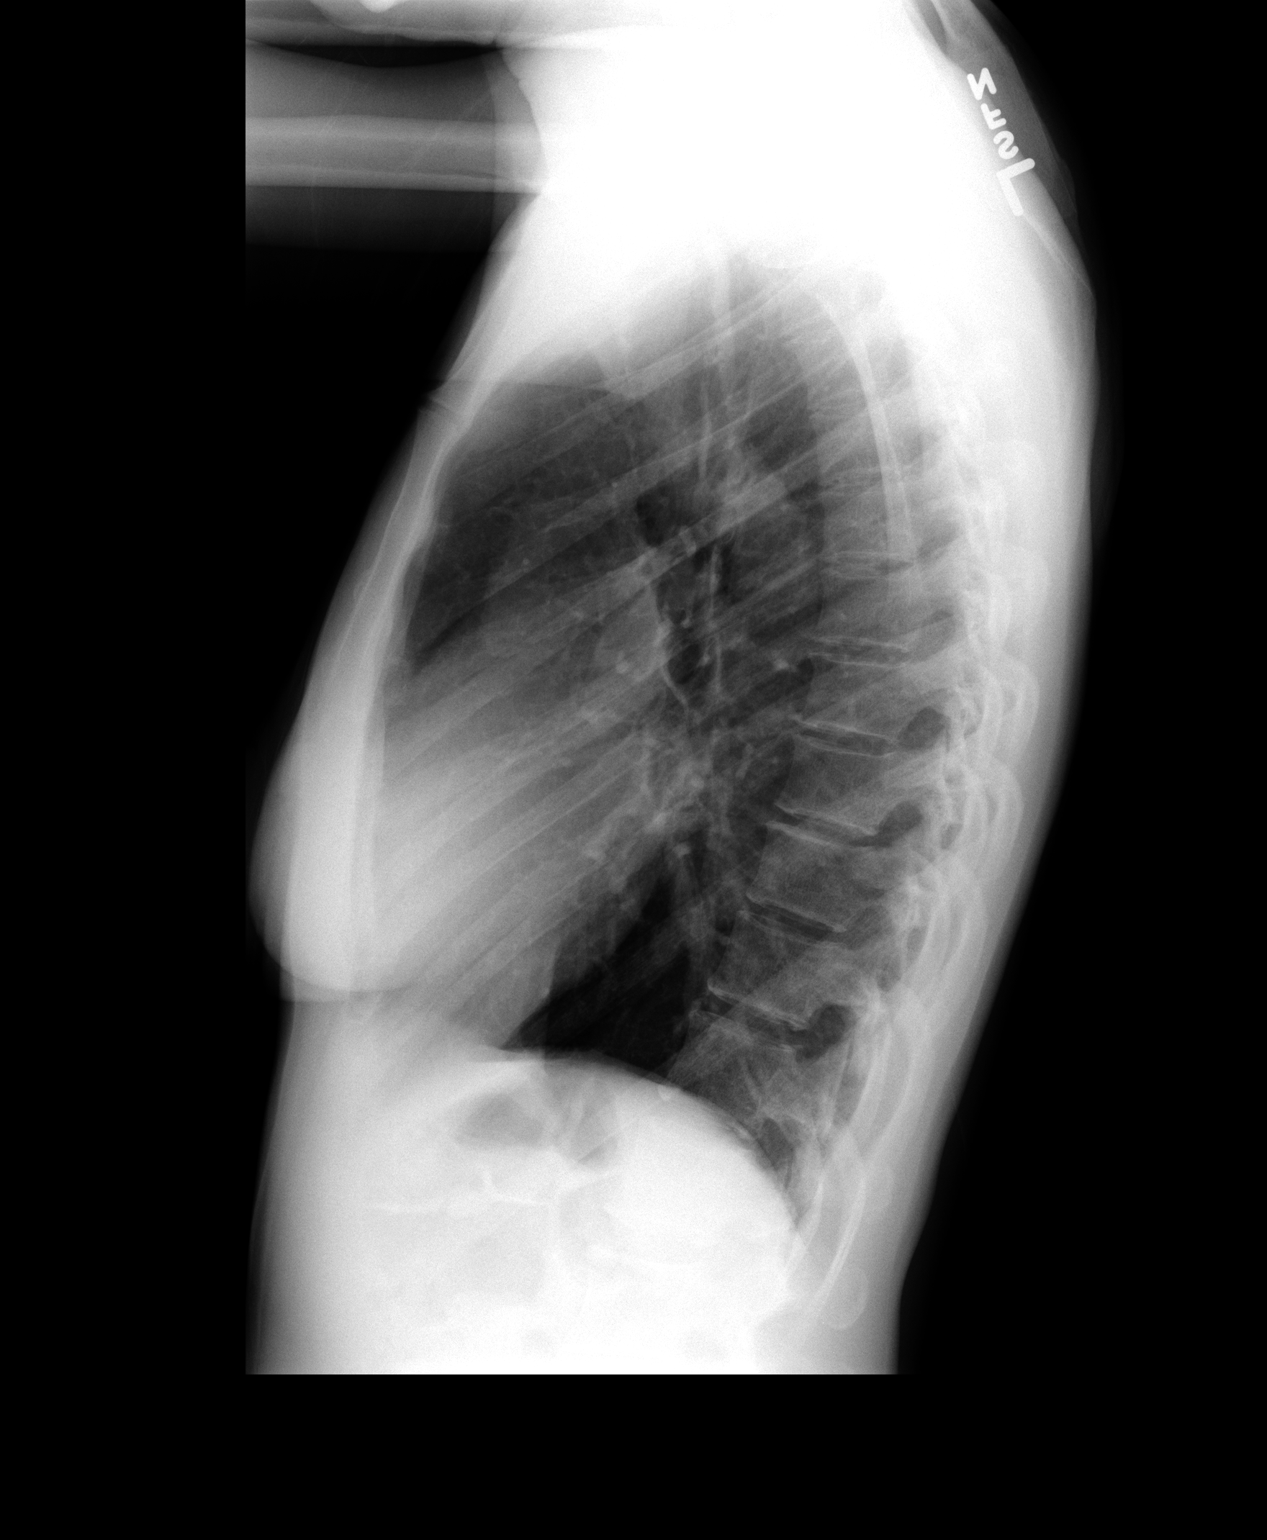

[2 of 2 positions shown; findings below may reference images not displayed]

FINDINGS: The heart size and mediastinal contours are within normal
limits.  Both lungs are clear.  The visualized skeletal structures
are unremarkable.
IMPRESSION: Negative exam.

## 2013-08-15 ENCOUNTER — Encounter: Payer: Self-pay | Admitting: *Deleted

## 2013-08-23 ENCOUNTER — Encounter: Payer: Self-pay | Admitting: Gastroenterology

## 2013-08-23 ENCOUNTER — Ambulatory Visit (INDEPENDENT_AMBULATORY_CARE_PROVIDER_SITE_OTHER): Payer: BC Managed Care – PPO | Admitting: Gastroenterology

## 2013-08-23 VITALS — BP 96/60 | HR 76 | Ht 63.5 in | Wt 110.1 lb

## 2013-08-23 DIAGNOSIS — K220 Achalasia of cardia: Secondary | ICD-10-CM

## 2013-08-23 DIAGNOSIS — R1013 Epigastric pain: Secondary | ICD-10-CM

## 2013-08-23 DIAGNOSIS — Z9889 Other specified postprocedural states: Secondary | ICD-10-CM

## 2013-08-23 DIAGNOSIS — K439 Ventral hernia without obstruction or gangrene: Secondary | ICD-10-CM

## 2013-08-23 NOTE — Progress Notes (Signed)
This is a 43 year old African American female who had laparoscopic Heller myotomy with a Dor fundoplication performed last April by Dr. Jackolyn Confer because of achalasia..  She's done extremely well and denies any dysphagia.  She has some pain in her epigastric area which is positional, and really is not associated with any specific GI issues otherwise.  She complains of a" itchy throat" but no true reflux symptoms.  She is refused a trial of Prilosec because in the past it" made things worse".  Since her surgery she's gained weight and denies any specific food intolerances.  She does have an incisional hernia in her epigastric area. Per Dr. Bertrum Sol note.  Upper GI series on 11/07/2012 showed a good intact fundoplication without any evidence of leak or other abnormalities, but with a smooth narrowing of the distal esophagus.  At the time of her surgery for her myotomy, she did have a small mucosal tear which was repaired, and an intraoperative endoscopy by Dr. Alphonsa Overall.  She's had excellent followup care after excellent surgical care by Dr. Zella Richer..  She denies abuse of alcohol, cigarettes, or NSAIDs.  Patient also denies upper GI or hepatobiliary complaints.  Current Medications, Allergies, Past Medical History, Past Surgical History, Family History and Social History were reviewed in [employer] record.  ROS: All systems were reviewed and are negative unless otherwise stated in the HPI.          Physical blood pressure 96/60, pulse 76 and regular weight 110 pounds with a BMI of 19.2.  Abdominal exam shows what appears to be a ventral hernia very poor rectal muscle approximation in her epigastric area.  I cannot appreciate a definite incisional or umbilical hernia.  Bowel sounds are normal and there is no succussion splash.  Chest is clear and she is in her regular rhythm without murmurs gallops or rubs.  Exam of her oropharyngeal areas  unremarkable.    Assessment and Plan: Current epigastric pain probably from ventral hernia.  The patient desires endoscopy for followup of her achalasia and Heller myotomy, and I think this is certainly reasonable, and we'll try to set this up with Dr. Hilarie Fredrickson in the near future.  She is really not having any dysphagia to warant repeat high-resolution esophageal manometry.  If her endoscopy is otherwise unremarkable, I think followup surgical exam and possible ventral hernia repairs in order.  As mentioned above, she has no hepatobiliary symptomatology or complaints.  CC : Dr. Jackolyn Confer, CCS

## 2013-08-23 NOTE — Patient Instructions (Addendum)
We will call you on Tuesday, once we talk to Dr. Hilarie Fredrickson about scheduling procedure for upper endoscopy  You may have to come back in to sign paper work at no charge to you

## 2013-08-26 ENCOUNTER — Telehealth: Payer: Self-pay | Admitting: *Deleted

## 2013-08-26 NOTE — Telephone Encounter (Signed)
I called patient to schedule EGD Patient said that she has to look at her schedule and call me back

## 2013-08-26 NOTE — Telephone Encounter (Signed)
Message copied by Carlyle Dolly on Mon Aug 26, 2013  8:12 AM ------      Message from: Jerene Bears      Created: Sun Aug 25, 2013  2:37 PM       Okay to schedule EGD without OV 1st      Thanks      JMP            ----- Message -----         From: Carlyle Dolly, CMA         Sent: 08/23/2013  10:08 AM           To: Jerene Bears, MD            Dr. Sharlett Iles request patient have an EGD scheduled with you for follow up on Achalasia Surgery.      Caren Griffins advised you like to meet with the patient first before we schedule procedures.      Please advise if you would like for me to go ahead and schedule procedure or if you would like an office visit first.            Thank you        ------

## 2014-03-27 ENCOUNTER — Other Ambulatory Visit (HOSPITAL_COMMUNITY)
Admission: RE | Admit: 2014-03-27 | Discharge: 2014-03-27 | Disposition: A | Discharge status: Home / Self Care | Payer: BC Managed Care – PPO | Source: Ambulatory Visit | Attending: Family Medicine | Admitting: Family Medicine

## 2014-03-27 ENCOUNTER — Emergency Department (HOSPITAL_COMMUNITY)
Admission: EM | Admit: 2014-03-27 | Discharge: 2014-03-27 | Disposition: A | Discharge status: Home / Self Care | Payer: BC Managed Care – PPO | Source: Home / Self Care | Attending: Family Medicine | Admitting: Family Medicine

## 2014-03-27 ENCOUNTER — Encounter (HOSPITAL_COMMUNITY): Payer: Self-pay | Admitting: Emergency Medicine

## 2014-03-27 DIAGNOSIS — B373 Candidiasis of vulva and vagina: Secondary | ICD-10-CM

## 2014-03-27 DIAGNOSIS — Z113 Encounter for screening for infections with a predominantly sexual mode of transmission: Secondary | ICD-10-CM | POA: Insufficient documentation

## 2014-03-27 DIAGNOSIS — L819 Disorder of pigmentation, unspecified: Secondary | ICD-10-CM

## 2014-03-27 DIAGNOSIS — N760 Acute vaginitis: Secondary | ICD-10-CM | POA: Diagnosis present

## 2014-03-27 DIAGNOSIS — B3731 Acute candidiasis of vulva and vagina: Secondary | ICD-10-CM

## 2014-03-27 LAB — POCT URINALYSIS DIP (DEVICE)
BILIRUBIN URINE: NEGATIVE
Glucose, UA: NEGATIVE mg/dL
Hgb urine dipstick: NEGATIVE
KETONES UR: NEGATIVE mg/dL
LEUKOCYTES UA: NEGATIVE
NITRITE: NEGATIVE
PROTEIN: NEGATIVE mg/dL
Specific Gravity, Urine: 1.025 (ref 1.005–1.030)
Urobilinogen, UA: 1 mg/dL (ref 0.0–1.0)
pH: 6 (ref 5.0–8.0)

## 2014-03-27 LAB — POCT PREGNANCY, URINE: PREG TEST UR: NEGATIVE

## 2014-03-27 MED ORDER — FLUCONAZOLE 150 MG PO TABS: 150.0000 mg | ORAL_TABLET | Freq: Every day | ORAL | Status: DC

## 2014-03-27 MED ORDER — TRIAMCINOLONE ACETONIDE 0.5 % EX OINT: 1.0000 "application " | TOPICAL_OINTMENT | Freq: Two times a day (BID) | CUTANEOUS | Status: DC

## 2014-03-27 NOTE — ED Notes (Signed)
Reports two days of vaginal itching.  Denies discharge, pelvic/abdominal pain.  No otc treatments tried.

## 2014-03-27 NOTE — Discharge Instructions (Signed)
The reason for the lightening of your skin is unclear. Please use this steroid cream and follow-up with your PCP for possible referral to dermatology. Try to shade yourself from the sun as much as possible as this may contribute. Continue using Eucerin as a moisturizer as this will help.  Candida Infection A Candida infection (also called yeast, fungus, and Monilia infection) is an overgrowth of yeast that can occur anywhere on the body. A yeast infection commonly occurs in warm, moist body areas. Usually, the infection remains localized but can spread to become a systemic infection. A yeast infection may be a sign of a more severe disease such as diabetes, leukemia, or AIDS. A yeast infection can occur in both men and women. In women, Candida vaginitis is a vaginal infection. It is one of the most common causes of vaginitis. Men usually do not have symptoms or know they have an infection until other problems develop. Men may find out they have a yeast infection because their sex partner has a yeast infection. Uncircumcised men are more likely to get a yeast infection than circumcised men. This is because the uncircumcised glans is not exposed to air and does not remain as dry as that of a circumcised glans. Older adults may develop yeast infections around dentures. CAUSES  Women  Antibiotics.  Steroid medication taken for a long time.  Being overweight (obese).  Diabetes.  Poor immune condition.  Certain serious medical conditions.  Immune suppressive medications for organ transplant patients.  Chemotherapy.  Pregnancy.  Menstruation.  Stress and fatigue.  Intravenous drug use.  Oral contraceptives.  Wearing tight-fitting clothes in the crotch area.  Catching it from a sex partner who has a yeast infection.  Spermicide.  Intravenous, urinary, or other catheters. Men  Catching it from a sex partner who has a yeast infection.  Having oral or anal sex with a person who has  the infection.  Spermicide.  Diabetes.  Antibiotics.  Poor immune system.  Medications that suppress the immune system.  Intravenous drug use.  Intravenous, urinary, or other catheters. SYMPTOMS  Women  Thick, white vaginal discharge.  Vaginal itching.  Redness and swelling in and around the vagina.  Irritation of the lips of the vagina and perineum.  Blisters on the vaginal lips and perineum.  Painful sexual intercourse.  Low blood sugar (hypoglycemia).  Painful urination.  Bladder infections.  Intestinal problems such as constipation, indigestion, bad breath, bloating, increase in gas, diarrhea, or loose stools. Men  Men may develop intestinal problems such as constipation, indigestion, bad breath, bloating, increase in gas, diarrhea, or loose stools.  Dry, cracked skin on the penis with itching or discomfort.  Jock itch.  Dry, flaky skin.  Athlete's foot.  Hypoglycemia. DIAGNOSIS  Women  A history and an exam are performed.  The discharge may be examined under a microscope.  A culture may be taken of the discharge. Men  A history and an exam are performed.  Any discharge from the penis or areas of cracked skin will be looked at under the microscope and cultured.  Stool samples may be cultured. TREATMENT  Women  Vaginal antifungal suppositories and creams.  Medicated creams to decrease irritation and itching on the outside of the vagina.  Warm compresses to the perineal area to decrease swelling and discomfort.  Oral antifungal medications.  Medicated vaginal suppositories or cream for repeated or recurrent infections.  Wash and dry the irritation areas before applying the cream.  Eating yogurt with Lactobacillus  may help with prevention and treatment.  Sometimes painting the vagina with gentian violet solution may help if creams and suppositories do not work. Men  Antifungal creams and oral antifungal medications.  Sometimes  treatment must continue for 30 days after the symptoms go away to prevent recurrence. HOME CARE INSTRUCTIONS  Women  Use cotton underwear and avoid tight-fitting clothing.  Avoid colored, scented toilet paper and deodorant tampons or pads.  Do not douche.  Keep your diabetes under control.  Finish all the prescribed medications.  Keep your skin clean and dry.  Consume milk or yogurt with Lactobacillus-active culture regularly. If you get frequent yeast infections and think that is what the infection is, there are over-the-counter medications that you can get. If the infection does not show healing in 3 days, talk to your caregiver.  Tell your sex partner you have a yeast infection. Your partner may need treatment also, especially if your infection does not clear up or recurs. Men  Keep your skin clean and dry.  Keep your diabetes under control.  Finish all prescribed medications.  Tell your sex partner that you have a yeast infection so he or she can be treated if necessary. SEEK MEDICAL CARE IF:   Your symptoms do not clear up or worsen in one week after treatment.  You have an oral temperature above 102 F (38.9 C).  You have trouble swallowing or eating for a prolonged time.  You develop blisters on and around your vagina.  You develop vaginal bleeding and it is not your menstrual period.  You develop abdominal pain.  You develop intestinal problems as mentioned above.  You get weak or light-headed.  You have painful or increased urination.  You have pain during sexual intercourse. MAKE SURE YOU:   Understand these instructions.  Will watch your condition.  Will get help right away if you are not doing well or get worse. Document Released: 08/04/2004 Document Revised: 11/11/2013 Document Reviewed: 11/16/2009 [facility] Patient Information 2015 [place], [place]. This information is not intended to replace advice given to you by your health care provider. Make  sure you discuss any questions you have with your health care provider.

## 2014-03-27 NOTE — ED Provider Notes (Signed)
CSN: [id number]     Arrival date & time 03/27/14  4580 History   First MD Initiated Contact with Patient 03/27/14 1035     Chief Complaint  Patient presents with  . Vaginal Itching   (Consider location/radiation/quality/duration/timing/severity/associated sxs/prior Treatment) Patient is a 43 y.o. female presenting with vaginal itching and rash.  Vaginal Itching This is a new problem. The current episode started 2 days ago. The problem occurs constantly. The problem has not changed since onset.Pertinent negatives include no abdominal pain. Associated symptoms comments: Vaginal pain when wiping. Nothing aggravates the symptoms. Nothing relieves the symptoms. She has tried nothing for the symptoms.  Rash Location:  Torso and leg Torso rash location:  L chest, R chest and abd LLQ Leg rash location:  L hip Quality: not burning, not dry and not itchy   Quality comment:  Hypopigmented Severity:  Mild Onset quality:  Sudden Duration:  1 month Progression:  Spreading Chronicity:  New Context: sun exposure (has been outside a lot this summer with daughter (track and field))   Relieved by:  None tried Worsened by:  Nothing tried Ineffective treatments:  None tried Associated symptoms: no abdominal pain, no fever, no nausea and not vomiting     Past Medical History  Diagnosis Date  . Stricture and stenosis of esophagus   . Achalasia   . Constipation   . Noncompliance   . Yeast infection   . Bacterial infection   . Ovarian cyst   . Anemia   . History of chicken pox   . Hiatal hernia   . GERD (gastroesophageal reflux disease)     Chronic   . PONV (postoperative nausea and vomiting)   . Eczema    Past Surgical History  Procedure Laterality Date  . Tonsillectomy    . Tubal ligation    . Dilation and curettage of uterus    . Breast lumpectomy    . Dilitation & currettage/hystroscopy with versapoint resection  06/08/2012    Procedure: DILATATION & CURETTAGE/HYSTEROSCOPY WITH  VERSAPOINT RESECTION;  Surgeon: Alwyn Pea, MD;  Location: [facility];  Service: Gynecology;  Laterality: N/A;  and novasure  . Esophageal manometry N/A 09/17/2012    Procedure: ESOPHAGEAL MANOMETRY (EM);  Surgeon: Sable Feil, MD;  Location: [facility];  Service: Endoscopy;  Laterality: N/A;  . Uterine fibroid surgery    . Heller myotomy N/A 11/05/2012    Procedure: LAPAROSCOPIC ESOPHAGEAL MYOTOMY AND ANTERIOR FUNDOPLICATION;  Surgeon: Odis Hollingshead, MD;  Location: [facility];  Service: General;  Laterality: N/A;  . Esophagogastroduodenoscopy N/A 11/05/2012    Procedure: ESOPHAGOGASTRODUODENOSCOPY (EGD);  Surgeon: Odis Hollingshead, MD;  Location: [facility];  Service: General;  Laterality: N/A;   Family History  Problem Relation Age of Onset  . Asthma Mother   . Asthma Daughter   . Cancer Maternal Grandmother   . Cancer Paternal Uncle   . Hypertension Paternal Uncle   . Hypertension Paternal Aunt   . Diabetes Paternal Uncle   . Diabetes Paternal Aunt   . Rheumatic fever Daughter    History  Substance Use Topics  . Smoking status: Former Smoker -- 0.50 packs/day    Types: Cigarettes    Quit date: 11/05/2012  . Smokeless tobacco: Never Used  . Alcohol Use: No   OB History   Grav Para Term Preterm Abortions TAB SAB Ect Mult Living   4 3   1     3      Review of Systems  Constitutional:  Negative for fever.  Gastrointestinal: Negative for nausea, vomiting and abdominal pain.  Genitourinary: Positive for vaginal pain (when wiping). Negative for dysuria, urgency, frequency, flank pain, vaginal bleeding, vaginal discharge, difficulty urinating, genital sores, pelvic pain and dyspareunia.  Skin: Positive for rash.    Allergies  Other  Home Medications   Prior to Admission medications   Medication Sig Start Date End Date Taking? Authorizing Provider  fluconazole (DIFLUCAN) 150 MG tablet Take 1 tablet (150 mg total) by mouth daily. 03/27/14   Cordelia Poche, MD  triamcinolone  ointment (KENALOG) 0.5 % Apply 1 application topically 2 (two) times daily. 03/27/14   Cordelia Poche, MD   BP 125/81  Pulse 72  Temp(Src) 98.6 F (37 C) (Oral)  Resp 14  SpO2 100%  LMP 03/06/2014 Physical Exam  Genitourinary: Pelvic exam was performed with patient prone. Cervix exhibits no discharge. No erythema, tenderness or bleeding around the vagina. Vaginal discharge (white, thick) found.  Skin:  Four circular areas of slight hypopigmentation on chest, lower abdomen and left leg. Patches. No erythema. Lower lip is also slightly hypopigmented.    ED Course  Procedures (including critical care time) Labs Review Labs Reviewed  POCT URINALYSIS DIP (DEVICE)  POCT PREGNANCY, URINE  CERVICOVAGINAL ANCILLARY ONLY    Imaging Review No results found.   MDM   1. Vaginal candidiasis   2. Skin hypopigmentation    Patient with vaginal candidiasis. Will confirm with wet prep. Culture for gonorrhea and chlamydia obtained and sent. Patient to be contacted with results. Will treat with diflucan. One refill given. Hypopigmentation does not appear consistent with vitiligo. Does not appear to be tinea versicolor. Possibly pityriasis alba. Prescribed Triamcinolone cream with instructions to follow-up with PCP for possible referral to dermatology. Patient understands and agrees with plan. Stable for discharge home.    Cordelia Poche, MD 03/27/14 [phone]

## 2014-03-27 NOTE — ED Provider Notes (Signed)
Christy Collins is a 43 y.o. female who presents to Urgent Care today for vaginal discharge and hypopigmented rash. Patient has a several day history of vaginal itching associated with a whitish discharge. No fevers or chills nausea vomiting or diarrhea.  Additionally patient has a several month history of hypopigmented macules on her trunk and leg. This is associated with slight hyperpigmentation of her lips as well. She denies any itching fevers or chills. She has not tried any treatments yet. She feels well otherwise.   Past Medical History  Diagnosis Date  . Stricture and stenosis of esophagus   . Achalasia   . Constipation   . Noncompliance   . Yeast infection   . Bacterial infection   . Ovarian cyst   . Anemia   . History of chicken pox   . Hiatal hernia   . GERD (gastroesophageal reflux disease)     Chronic   . PONV (postoperative nausea and vomiting)   . Eczema    History  Substance Use Topics  . Smoking status: Former Smoker -- 0.50 packs/day    Types: Cigarettes    Quit date: 11/05/2012  . Smokeless tobacco: Never Used  . Alcohol Use: No   ROS as above Medications: No current facility-administered medications for this encounter.   No current outpatient prescriptions on file.    Exam:  BP 125/81  Pulse 72  Temp(Src) 98.6 F (37 C) (Oral)  Resp 14  SpO2 100%  LMP 03/06/2014 Gen: Well NAD HEENT: EOMI,  MMM Lungs: Normal work of breathing. CTABL Heart: RRR no MRG Abd: NABS, Soft. Nondistended, Nontender Exts: Brisk capillary refill, warm and well perfused.  Skin: Oval mildly hypopigmented macules on chest. The lips are very mildly hypopigmented. No scaling or tenderness. GYN: Please see Dr. Theodoro Grist note for his exam.  Results for orders placed during the hospital encounter of 03/27/14 (from the past 24 hour(s))  POCT URINALYSIS DIP (DEVICE)     Status: None   Collection Time    03/27/14 10:26 AM      Result Value Ref Range   Glucose, UA NEGATIVE   NEGATIVE mg/dL   Bilirubin Urine NEGATIVE  NEGATIVE   Ketones, ur NEGATIVE  NEGATIVE mg/dL   Specific Gravity, Urine 1.025  1.005 - 1.030   Hgb urine dipstick NEGATIVE  NEGATIVE   pH 6.0  5.0 - 8.0   Protein, ur NEGATIVE  NEGATIVE mg/dL   Urobilinogen, UA 1.0  0.0 - 1.0 mg/dL   Nitrite NEGATIVE  NEGATIVE   Leukocytes, UA NEGATIVE  NEGATIVE  POCT PREGNANCY, URINE     Status: None   Collection Time    03/27/14 10:29 AM      Result Value Ref Range   Preg Test, Ur NEGATIVE  NEGATIVE   No results found.  Assessment and Plan: 43 y.o. female with  1) vaginal yeast infection: Fluconazole 2) hypopigmented macules: Unclear etiology. Appearance is not consistent with tinea versicolor. Plan to treat with triamcinolone cream and followup with PCP for possible referral to dermatology if not better.  Discussed warning signs or symptoms. Please see discharge instructions. Patient expresses understanding.     Gregor Hams, MD 03/27/14 1052

## 2014-03-28 LAB — CERVICOVAGINAL ANCILLARY ONLY
CHLAMYDIA, DNA PROBE: NEGATIVE
NEISSERIA GONORRHEA: NEGATIVE

## 2014-03-28 NOTE — ED Provider Notes (Signed)
Medical screening examination/treatment/procedure(s) were performed by a resident physician or non-physician practitioner and as the supervising physician I was immediately available for consultation/collaboration.  Lynne Leader, MD   Gregor Hams, MD 03/28/14 [phone]

## 2014-03-30 ENCOUNTER — Telehealth (HOSPITAL_COMMUNITY): Payer: Self-pay | Admitting: Family Medicine

## 2014-03-30 MED ORDER — METRONIDAZOLE 500 MG PO TABS: 500.0000 mg | ORAL_TABLET | Freq: Two times a day (BID) | ORAL | Status: DC

## 2014-03-30 MED ORDER — FLUCONAZOLE 150 MG PO TABS: 150.0000 mg | ORAL_TABLET | Freq: Every day | ORAL | Status: DC

## 2014-03-30 NOTE — ED Notes (Signed)
After verifying ID, discussed lab results. Patient will pick up 2 RX at [place] [employer]

## 2014-03-30 NOTE — ED Notes (Signed)
Cytology positive for BV.  Flagyl called in. RN to contact the patient.   Gregor Hams, MD 03/30/14 0830

## 2014-03-30 NOTE — Telephone Encounter (Signed)
Message copied by Gregor Hams on Sun Mar 30, 2014  8:29 AM ------      Message from: Cordelia Poche A      Created: Fri Mar 28, 2014  5:47 PM       Hey, this lady's cytology came back. Not sure if you got it. Positive for BV. I was going to prescribe medication but not sure if that would be proper of me since I'm not officially employed at [facility]. Was going to prescribe metronidazole 500mg  BID x7days. ------

## 2014-03-30 NOTE — ED Notes (Signed)
Fluconazole also sent in.   Rodney Cruise, MD 03/30/14 [phone]

## 2014-05-07 ENCOUNTER — Other Ambulatory Visit: Payer: Self-pay

## 2014-05-12 ENCOUNTER — Encounter (HOSPITAL_COMMUNITY): Payer: Self-pay | Admitting: Emergency Medicine

## 2014-05-14 ENCOUNTER — Other Ambulatory Visit: Payer: Self-pay | Admitting: Obstetrics and Gynecology

## 2014-05-14 ENCOUNTER — Encounter: Payer: Self-pay | Admitting: *Deleted

## 2014-05-14 DIAGNOSIS — Z1231 Encounter for screening mammogram for malignant neoplasm of breast: Secondary | ICD-10-CM

## 2014-05-16 ENCOUNTER — Ambulatory Visit
Admission: RE | Admit: 2014-05-16 | Discharge: 2014-05-16 | Disposition: A | Discharge status: Home / Self Care | Payer: BC Managed Care – PPO | Source: Ambulatory Visit | Attending: Obstetrics and Gynecology | Admitting: Obstetrics and Gynecology

## 2014-05-16 DIAGNOSIS — Z1231 Encounter for screening mammogram for malignant neoplasm of breast: Secondary | ICD-10-CM

## 2014-05-23 ENCOUNTER — Other Ambulatory Visit: Payer: Self-pay | Admitting: Obstetrics and Gynecology

## 2014-05-23 DIAGNOSIS — R2231 Localized swelling, mass and lump, right upper limb: Secondary | ICD-10-CM

## 2014-06-04 ENCOUNTER — Ambulatory Visit
Admission: RE | Admit: 2014-06-04 | Discharge: 2014-06-04 | Disposition: A | Discharge status: Home / Self Care | Payer: BC Managed Care – PPO | Source: Ambulatory Visit | Attending: Obstetrics and Gynecology | Admitting: Obstetrics and Gynecology

## 2014-06-04 DIAGNOSIS — R2231 Localized swelling, mass and lump, right upper limb: Secondary | ICD-10-CM

## 2014-06-13 ENCOUNTER — Ambulatory Visit: Payer: BC Managed Care – PPO | Admitting: Family Medicine

## 2014-06-20 ENCOUNTER — Encounter: Payer: Self-pay | Admitting: Family Medicine

## 2014-06-20 ENCOUNTER — Ambulatory Visit (INDEPENDENT_AMBULATORY_CARE_PROVIDER_SITE_OTHER): Payer: BC Managed Care – PPO | Admitting: Family Medicine

## 2014-06-20 VITALS — BP 110/70 | HR 76 | Temp 98.4°F | Resp 18 | Ht 63.5 in | Wt 109.0 lb

## 2014-06-20 DIAGNOSIS — L80 Vitiligo: Secondary | ICD-10-CM | POA: Insufficient documentation

## 2014-06-20 DIAGNOSIS — Z0000 Encounter for general adult medical examination without abnormal findings: Secondary | ICD-10-CM

## 2014-06-20 NOTE — Progress Notes (Signed)
Subjective:    Patient ID: Christy Collins, female    DOB: 02-08-1971, 43 y.o.   MRN: [id number]  HPI Patient is here today for complete physical exam. She is also here to reestablish care. She sees a gynecologist to perform her breast exams and Pap smears. She has no medical concerns today outside of her rash. She has a deep pigmented rash on her lips, a circular patch the size of a quarter on her right breast, and a circular depigmented patch the size of a baseball on her left breast. She also has similar lesions around the introitus. It has the characteristic appearance of vitiligo. She is seen her gynecologist who performed a biopsy which was inconclusive. Past Medical History  Diagnosis Date  . Stricture and stenosis of esophagus   . Achalasia   . Constipation   . Noncompliance   . Yeast infection   . Bacterial infection   . Ovarian cyst   . Anemia   . History of chicken pox   . Hiatal hernia   . GERD (gastroesophageal reflux disease)     Chronic   . PONV (postoperative nausea and vomiting)   . Eczema   . Vitiligo    Past Surgical History  Procedure Laterality Date  . Tonsillectomy    . Tubal ligation    . Dilation and curettage of uterus    . Breast lumpectomy    . Dilitation & currettage/hystroscopy with versapoint resection  06/08/2012    Procedure: DILATATION & CURETTAGE/HYSTEROSCOPY WITH VERSAPOINT RESECTION;  Surgeon: Alwyn Pea, MD;  Location: [facility];  Service: Gynecology;  Laterality: N/A;  and novasure  . Esophageal manometry N/A 09/17/2012    Procedure: ESOPHAGEAL MANOMETRY (EM);  Surgeon: Sable Feil, MD;  Location: [facility];  Service: Endoscopy;  Laterality: N/A;  . Uterine fibroid surgery    . Heller myotomy N/A 11/05/2012    Procedure: LAPAROSCOPIC ESOPHAGEAL MYOTOMY AND ANTERIOR FUNDOPLICATION;  Surgeon: Odis Hollingshead, MD;  Location: [facility];  Service: General;  Laterality: N/A;  . Esophagogastroduodenoscopy N/A 11/05/2012    Procedure:  ESOPHAGOGASTRODUODENOSCOPY (EGD);  Surgeon: Odis Hollingshead, MD;  Location: [facility];  Service: General;  Laterality: N/A;   No current outpatient prescriptions on file prior to visit.   No current facility-administered medications on file prior to visit.   Allergies  Allergen Reactions  . Other     Patient states that she cant take any pain meds and if she is put to sleep it makes her sick.   History   Social History  . Marital Status: Married    Spouse Name: N/A    Number of Children: 3  . 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 Smoking status: Former Smoker -- 0.50 packs/day    Types: Cigarettes    Quit date: 11/05/2012  . Smokeless tobacco: Never Used  . Alcohol Use: No  . Drug Use: No  . Sexual Activity: Yes    Birth Control/ Protection: Surgical   Other Topics Concern  . Not on file   Social History Narrative   Family History  Problem Relation Age of Onset  . Asthma Mother   . Hyperlipidemia Mother   . Asthma Daughter   . Cancer Maternal Grandmother   . Cancer Paternal Uncle   . Hypertension Paternal Uncle   . Hypertension Paternal Aunt   . Diabetes Paternal Uncle   .  Diabetes Paternal Aunt   . Rheumatic fever Daughter   . Alcohol abuse Father   . Hypertension Father       Review of Systems  All other systems reviewed and are negative.      Objective:   Physical Exam  Constitutional: She is oriented to person, place, and time. She appears well-developed and well-nourished. No distress.  HENT:  Head: Normocephalic and atraumatic.  Right Ear: External ear normal.  Left Ear: External ear normal.  Nose: Nose normal.  Mouth/Throat: Oropharynx is clear and moist.  Eyes: Conjunctivae and EOM are normal. Pupils are equal, round, and reactive to light. Right eye exhibits no discharge. Left eye exhibits no discharge. No scleral icterus.  Neck: Normal range of motion. Neck supple. No JVD  present. No tracheal deviation present. No thyromegaly present.  Cardiovascular: Normal rate, regular rhythm, normal heart sounds and intact distal pulses.  Exam reveals no gallop and no friction rub.   No murmur heard. Pulmonary/Chest: Effort normal and breath sounds normal. No stridor. No respiratory distress. She has no wheezes. She has no rales. She exhibits no tenderness.  Abdominal: Soft. Bowel sounds are normal. She exhibits no distension and no mass. There is no tenderness. There is no rebound and no guarding.  Musculoskeletal: Normal range of motion. She exhibits no edema or tenderness.  Lymphadenopathy:    She has no cervical adenopathy.  Neurological: She is alert and oriented to person, place, and time. She has normal reflexes. She displays normal reflexes. No cranial nerve deficit. She exhibits normal muscle tone. Coordination normal.  Skin: Skin is warm. Rash noted. She is not diaphoretic.  Psychiatric: She has a normal mood and affect. Her behavior is normal. Judgment and thought content normal.  Vitals reviewed.         Assessment & Plan:  Routine general medical examination at a health care facility  Physical exam is normal outside of the patient's vitiligo. I will schedule the patient to see a dermatologist. Meanwhile I recommended she use clobetasol ointment twice daily to the lesions on her skin. 50% of patients see improvement after 2 months. I will also arrange a second opinion with a dermatologist. I would like the patient to return fasting for a CBC, fasting lipid panel, CMP, and TSH. Her Pap smear and breast exam are performed at her gynecologist. I offered the patient a flu shot today but she declined that.

## 2014-07-24 ENCOUNTER — Encounter (HOSPITAL_COMMUNITY): Payer: Self-pay | Admitting: Obstetrics and Gynecology

## 2014-10-28 ENCOUNTER — Telehealth: Payer: Self-pay | Admitting: Gastroenterology

## 2014-10-28 NOTE — Telephone Encounter (Signed)
Spoke with patient and she is calling for f/u OV since Dr. Sharlett Iles has retired. She is still having a little reflux and has had some rectal pressure for several months. She does not have a preference for MD. Scheduled with Nicoletta Ba, PA on 11/10/14 at 1:30 PM.

## 2014-11-10 ENCOUNTER — Ambulatory Visit (INDEPENDENT_AMBULATORY_CARE_PROVIDER_SITE_OTHER): Payer: Self-pay | Admitting: Physician Assistant

## 2014-11-10 ENCOUNTER — Encounter: Payer: Self-pay | Admitting: Physician Assistant

## 2014-11-10 VITALS — BP 100/70 | HR 72 | Ht 63.5 in | Wt 117.0 lb

## 2014-11-10 DIAGNOSIS — R198 Other specified symptoms and signs involving the digestive system and abdomen: Secondary | ICD-10-CM

## 2014-11-10 DIAGNOSIS — K219 Gastro-esophageal reflux disease without esophagitis: Secondary | ICD-10-CM

## 2014-11-10 DIAGNOSIS — R194 Change in bowel habit: Secondary | ICD-10-CM

## 2014-11-10 DIAGNOSIS — L290 Pruritus ani: Secondary | ICD-10-CM

## 2014-11-10 MED ORDER — NYSTATIN 100000 UNIT/ML MT SUSP: OROMUCOSAL | Status: DC

## 2014-11-10 MED ORDER — FAMOTIDINE 40 MG PO TABS: ORAL_TABLET | ORAL | Status: DC

## 2014-11-10 MED ORDER — HYDROCORTISONE ACETATE 25 MG RE SUPP: RECTAL | Status: DC

## 2014-11-10 NOTE — Progress Notes (Signed)
Patient ID: Christy Collins, female   DOB: [birth year], 44 y.o.   MRN: [id number]   Subjective:    Patient ID: Christy Collins, female    DOB: 1970-09-26, 44 y.o.   MRN: [id number]  HPI Christy Collins is a pleasant 44 year old African-American female previously  known to Dr. Sharlett Iles who has a diagnosis of achalasia for which she underwent a laparoscopic Heller myotomy and umbilical  hernia repair September 2014. She says she has done very well postoperatively and has no difficulty swallowing. He says she has not been having any typical reflux symptoms but has had some itching in her throat over the past year which she says may be reflux. Seems to bother her at night more than during the day. She is concerned that she has yeast in her esophagus because she says she's always been very "yeasty" with recurrent episodes of thrush and vaginal candidiasis etc.'s not been on any recent antibiotics no regular PPI therapy. He also has been having problems with rectal itching no real abdominal pain but has rectal pressure which is new over the past several months. She has been having difficulty evacuating her bowels. She's not noted any melena or hematochezia. No prior colonoscopy. Family history negative for colon cancer.  Review of Systems Pertinent positive and negative review of systems were noted in the above HPI section.  All other review of systems was otherwise negative.  Outpatient Encounter Prescriptions as of 11/10/2014  . Order #: [id number] Class: Historical Med  . Order #: [id number] Class: Historical Med  . Order #: [id number] Class: Historical Med  . Order #: [id number] Class: Normal  . Order #: [id number] Class: Normal  . Order #: [id number] Class: Normal   Allergies  Allergen Reactions  . Other     Patient states that she cant take any pain meds and if she is put to sleep it makes her sick.   Patient Active Problem List   Diagnosis Date Noted  . Vitiligo   . ANXIETY 04/29/2008  . ACHALASIA s/p  laparoscopic Heller myotomy and Dor Fundoplication 23/53/6144  . ESOPHAGEAL STRICTURE 04/24/2008  . GERD 04/24/2008  . CONSTIPATION 04/24/2008  . NAUSEA 04/24/2008   History   Social History  . Marital Status: Married    Spouse Name: N/A  . Number of Children: 3  . 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 Smoking status: Former Smoker -- 0.50 packs/day    Types: Cigarettes    Quit date: 11/05/2012  . Smokeless tobacco: Never Used  . Alcohol Use: No  . Drug Use: No  . Sexual Activity: Yes    Birth Control/ Protection: Surgical   Other Topics Concern  . Not on file   Social History Narrative    Christy Collins's family history includes Alcohol abuse in her father; Asthma in her daughter and mother; Cancer in her maternal grandmother and paternal uncle; Diabetes in her paternal aunt and paternal uncle; Hyperlipidemia in her mother; Hypertension in her father, paternal aunt, and paternal uncle; Rheumatic fever in her daughter.      Objective:    Filed Vitals:   11/10/14 1329  BP: 100/70  Pulse: 72    Physical Exam  well-developed African-American female in no acute distress, pleasant blood pressure 100/70 pulse 72 height 5 foot 3 weight 117. HEENT; nontraumatic normocephalic EOMI PERRLA sclera anicteric supple no JVD, Cardiovascular ;regular rate and rhythm with S1-S2 no  murmur or gallop, Pulmonary; clear bilaterally, Abdomen; soft ,bowel sounds are present and basically nontender there is no palpable mass or hepatosplenomegaly, Rectal; exam somewhat decreased sphincter tone and no palpable mass no perianal rash or skin lesion noted stool heme negative, Extremities; no clubbing cyanosis or edema skin warm and dry, Psych ;mood and affect appropriate       Assessment & Plan:   #1 44 yo female with hx of Achalasia-status post laparoscopic Heller myotomy 2014 with resolution of symptoms. #2 pharyngeal/esophageal  pruritus type symptom. Patient worried about candidiasis-not evident on exam wonder if this is allergy related, cannot rule out GERD though atypical symptom #3 anal pruritus, change in bowel habits with difficulty evacuating bowels and rectal pressure-this may be related to internal hemorrhoids cannot rule out occult lesion  Plan; Mycostatin oral suspension 5 mL swish and swallow 4 times daily 10 days Start Pepcid 40 mg before meals dinner daily, patient did not do well with PPIs in the past Anusol HC suppositories at bedtime 10 days Schedule for colonoscopy with Dr. Henrene Pastor- procedure discussed in detail with patient and she is agreeable to proceed.   Amy S Esterwood PA-C 11/10/2014   Cc: Susy Frizzle, MD

## 2014-11-10 NOTE — Patient Instructions (Signed)
You have been scheduled for a colonoscopy. Please follow written instructions given to you at your visit today.   We will call you when we get the prep samples in. I will have one saved at the front desk for you. Dawna Jakes will call you . If you use inhalers (even only as needed), please bring them with you on the day of your procedure.  We sent prescriptions to [facility].

## 2014-11-11 NOTE — Progress Notes (Signed)
Agree with initial assessment and plans as outlined 

## 2014-11-19 ENCOUNTER — Telehealth: Payer: Self-pay | Admitting: *Deleted

## 2014-12-01 ENCOUNTER — Telehealth: Payer: Self-pay | Admitting: Internal Medicine

## 2014-12-01 NOTE — Telephone Encounter (Signed)
No charge. 

## 2014-12-03 ENCOUNTER — Encounter: Payer: Self-pay | Admitting: Internal Medicine

## 2014-12-11 ENCOUNTER — Telehealth: Payer: Self-pay | Admitting: *Deleted

## 2014-12-11 NOTE — Telephone Encounter (Signed)
Spoke to patient advising her to please pick up the [facility] for her colonoscopy/EGD scheduled for 01-05-2015..  I asked she pick it up by tomorrow Friday 12-12-2014. She said she will pick it up today.

## 2014-12-22 ENCOUNTER — Encounter: Payer: Self-pay | Admitting: Internal Medicine

## 2015-01-05 ENCOUNTER — Encounter: Payer: BLUE CROSS/BLUE SHIELD | Admitting: Internal Medicine

## 2015-01-05 ENCOUNTER — Telehealth: Payer: Self-pay | Admitting: Internal Medicine

## 2015-01-05 NOTE — Telephone Encounter (Signed)
Please charge no-show fee, thank you

## 2015-06-08 ENCOUNTER — Encounter: Payer: Self-pay | Admitting: Family Medicine

## 2015-07-14 ENCOUNTER — Encounter: Payer: Self-pay | Admitting: Family Medicine

## 2015-07-14 ENCOUNTER — Ambulatory Visit (INDEPENDENT_AMBULATORY_CARE_PROVIDER_SITE_OTHER): Payer: BLUE CROSS/BLUE SHIELD | Admitting: Family Medicine

## 2015-07-14 VITALS — BP 128/74 | HR 72 | Temp 98.9°F | Resp 16 | Ht 63.5 in | Wt 116.0 lb

## 2015-07-14 DIAGNOSIS — Z0000 Encounter for general adult medical examination without abnormal findings: Secondary | ICD-10-CM

## 2015-07-14 DIAGNOSIS — Z13220 Encounter for screening for lipoid disorders: Secondary | ICD-10-CM

## 2015-07-14 LAB — COMPLETE METABOLIC PANEL WITH GFR
ALK PHOS: 18 U/L — AB (ref 33–115)
ALT: 10 U/L (ref 6–29)
AST: 18 U/L (ref 10–30)
Albumin: 4 g/dL (ref 3.6–5.1)
BILIRUBIN TOTAL: 0.4 mg/dL (ref 0.2–1.2)
BUN: 7 mg/dL (ref 7–25)
CALCIUM: 9.1 mg/dL (ref 8.6–10.2)
CO2: 29 mmol/L (ref 20–31)
Chloride: 103 mmol/L (ref 98–110)
Creat: 0.88 mg/dL (ref 0.50–1.10)
GFR, EST NON AFRICAN AMERICAN: 81 mL/min (ref >=60)
GFR, Est African American: >89 mL/min (ref >=60)
GLUCOSE: 84 mg/dL (ref 70–99)
Potassium: 3.7 mmol/L (ref 3.5–5.3)
SODIUM: 141 mmol/L (ref 135–146)
TOTAL PROTEIN: 6.3 g/dL (ref 6.1–8.1)

## 2015-07-14 LAB — CBC WITH DIFFERENTIAL/PLATELET
BASOS PCT: 0 % (ref 0–1)
Basophils Absolute: 0 10*3/uL (ref 0.0–0.1)
Eosinophils Absolute: 0.2 10*3/uL (ref 0.0–0.7)
Eosinophils Relative: 3 % (ref 0–5)
HCT: 38.2 % (ref 36.0–46.0)
HEMOGLOBIN: 12.6 g/dL (ref 12.0–15.0)
LYMPHS ABS: 1.7 10*3/uL (ref 0.7–4.0)
Lymphocytes Relative: 21 % (ref 12–46)
MCH: 28.1 pg (ref 26.0–34.0)
MCHC: 33 g/dL (ref 30.0–36.0)
MCV: 85.3 fL (ref 78.0–100.0)
MONO ABS: 0.6 10*3/uL (ref 0.1–1.0)
MPV: 9.7 fL (ref 8.6–12.4)
Monocytes Relative: 7 % (ref 3–12)
NEUTROS ABS: 5.6 10*3/uL (ref 1.7–7.7)
NEUTROS PCT: 69 % (ref 43–77)
Platelets: 219 10*3/uL (ref 150–400)
RBC: 4.48 MIL/uL (ref 3.87–5.11)
RDW: 12.6 % (ref 11.5–15.5)
WBC: 8.1 10*3/uL (ref 4.0–10.5)

## 2015-07-14 LAB — LIPID PANEL
CHOLESTEROL: 131 mg/dL (ref 125–200)
HDL: 62 mg/dL (ref >=46)
LDL Cholesterol: 52 mg/dL (ref <130)
Total CHOL/HDL Ratio: 2.1 Ratio (ref <=5.0)
Triglycerides: 83 mg/dL (ref <150)
VLDL: 17 mg/dL (ref <30)

## 2015-07-14 NOTE — Progress Notes (Signed)
Subjective:    Patient ID: Christy Collins, female    DOB: 1970/11/07, 45 y.o.   MRN: [id number]  HPI Here for cpe.  She sees GYN for her pap/pelvic and breast exam.  Both of these were performed last month. She declines a flu shot today. She does have a cold. Symptoms include rhinorrhea and a sore throat for less than 24 hours. She denies any fever. She also has some pain in her right thumb at the MCP joint. She sprained that joint skating on Christmas. She has full range of motion and it is just sore. There is no instability of the on the collateral ligament. There is no tenderness to palpation in the anatomic snuffbox Past Medical History  Diagnosis Date  . Stricture and stenosis of esophagus   . Achalasia   . Constipation   . Noncompliance   . Yeast infection   . Bacterial infection   . Ovarian cyst   . Anemia   . History of chicken pox   . Hiatal hernia   . GERD (gastroesophageal reflux disease)     Chronic   . PONV (postoperative nausea and vomiting)   . Eczema   . Vitiligo    Past Surgical History  Procedure Laterality Date  . Tonsillectomy    . Tubal ligation    . Dilation and curettage of uterus    . Breast lumpectomy    . Dilitation & currettage/hystroscopy with versapoint resection  06/08/2012    Procedure: DILATATION & CURETTAGE/HYSTEROSCOPY WITH VERSAPOINT RESECTION;  Surgeon: Alwyn Pea, MD;  Location: [facility];  Service: Gynecology;  Laterality: N/A;  and novasure  . Esophageal manometry N/A 09/17/2012    Procedure: ESOPHAGEAL MANOMETRY (EM);  Surgeon: Sable Feil, MD;  Location: [facility];  Service: Endoscopy;  Laterality: N/A;  . Uterine fibroid surgery    . Heller myotomy N/A 11/05/2012    Procedure: LAPAROSCOPIC ESOPHAGEAL MYOTOMY AND ANTERIOR FUNDOPLICATION;  Surgeon: Odis Hollingshead, MD;  Location: [facility];  Service: General;  Laterality: N/A;  . Esophagogastroduodenoscopy N/A 11/05/2012    Procedure: ESOPHAGOGASTRODUODENOSCOPY (EGD);  Surgeon:  Odis Hollingshead, MD;  Location: [facility];  Service: General;  Laterality: N/A;   Current Outpatient Prescriptions on File Prior to Visit  Medication Sig Dispense Refill  . Black Cohosh 40 MG CAPS Take 80 mg by mouth daily.    . clobetasol ointment (TEMOVATE) 0.05 % Per GYN    . ESTRACE VAGINAL 0.1 MG/GM vaginal cream Place 1 application vaginally. As directed by GYN    . famotidine (PEPCID) 40 MG tablet Take 1 tab daily at bedtime. 30 tablet 8  . hydrocortisone (ANUSOL-HC) 25 MG suppository Use 1 suppository at bedtime. 14 suppository 1  . nystatin (MYCOSTATIN) 100000 UNIT/ML suspension Take 5 cc's 4 times daily, swish and swallow for 10 days. 200 mL 0   No current facility-administered medications on file prior to visit.   Allergies  Allergen Reactions  . Other     Patient states that she cant take any pain meds and if she is put to sleep it makes her sick.   Social History   Social History  . Marital Status: Married    Spouse Name: N/A  . Number of Children: 3  . 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 Smoking status: Former Smoker -- 0.50 packs/day    Types: Cigarettes    Quit date:  11/05/2012  . Smokeless tobacco: Never Used  . Alcohol Use: No  . Drug Use: No  . Sexual Activity: Yes    Birth Control/ Protection: Surgical   Other Topics Concern  . Not on file   Social History Narrative   Family History  Problem Relation Age of Onset  . Asthma Mother   . Hyperlipidemia Mother   . Asthma Daughter   . Cancer Maternal Grandmother   . Cancer Paternal Uncle   . Hypertension Paternal Uncle   . Hypertension Paternal Aunt   . Diabetes Paternal Uncle   . Diabetes Paternal Aunt   . Rheumatic fever Daughter   . Alcohol abuse Father   . Hypertension Father       Review of Systems  All other systems reviewed and are negative.      Objective:   Physical Exam  Constitutional: She is oriented to person,  place, and time. She appears well-developed and well-nourished. No distress.  HENT:  Head: Normocephalic and atraumatic.  Right Ear: External ear normal.  Left Ear: External ear normal.  Nose: Nose normal.  Mouth/Throat: Oropharynx is clear and moist. No oropharyngeal exudate.  Eyes: Conjunctivae and EOM are normal. Pupils are equal, round, and reactive to light. Right eye exhibits no discharge. Left eye exhibits no discharge. No scleral icterus.  Neck: Normal range of motion. Neck supple. No JVD present. No tracheal deviation present. No thyromegaly present.  Cardiovascular: Normal rate, regular rhythm, normal heart sounds and intact distal pulses.  Exam reveals no gallop and no friction rub.   No murmur heard. Pulmonary/Chest: Effort normal and breath sounds normal. No stridor. No respiratory distress. She has no wheezes. She has no rales. She exhibits no tenderness.  Abdominal: Soft. Bowel sounds are normal. She exhibits no distension and no mass. There is no tenderness. There is no rebound and no guarding.  Musculoskeletal: Normal range of motion. She exhibits no edema or tenderness.  Lymphadenopathy:    She has no cervical adenopathy.  Neurological: She is alert and oriented to person, place, and time. She has normal reflexes. She displays normal reflexes. No cranial nerve deficit. She exhibits normal muscle tone. Coordination normal.  Skin: Skin is warm. No rash noted. She is not diaphoretic. No erythema. No pallor.  Psychiatric: She has a normal mood and affect. Her behavior is normal. Judgment and thought content normal.  Vitals reviewed.         Assessment & Plan:  Routine general medical examination at a health care facility - Plan: CBC with Differential/Platelet, COMPLETE METABOLIC PANEL WITH GFR, Lipid panel  Screening cholesterol level - Plan: COMPLETE METABOLIC PANEL WITH GFR, Lipid panel  Physical exam is completely normal. I will check a CBC, CMP, fasting lipid panel. I  recommended a flu shot but she declined. Cancer screening is up-to-date. I recommended symptomatic treatment for her cold with tincture of time. Pain in the first MCP joint should improve over the next 2 weeks. I see no evidence of a fracture or normal collateral ligament tear. If pain persists, consider an x-ray

## 2015-07-16 ENCOUNTER — Encounter: Payer: Self-pay | Admitting: Family Medicine

## 2015-09-28 ENCOUNTER — Ambulatory Visit (INDEPENDENT_AMBULATORY_CARE_PROVIDER_SITE_OTHER): Payer: BLUE CROSS/BLUE SHIELD | Admitting: Physician Assistant

## 2015-09-28 VITALS — BP 110/70 | HR 63 | Temp 98.3°F | Resp 16 | Ht 64.0 in | Wt 121.8 lb

## 2015-09-28 DIAGNOSIS — T7840XA Allergy, unspecified, initial encounter: Secondary | ICD-10-CM | POA: Diagnosis not present

## 2015-09-28 NOTE — Patient Instructions (Addendum)
Take zyrtec once a day as needed for itching. If you develop shortness of breath, tongue swelling, go to the emergency room. Return if symptoms not improving    IF you received an x-ray today, you will receive an invoice from [facility]. Please contact [facility] at [phone] with questions or concerns regarding your invoice.   IF you received labwork today, you will receive an invoice from [employer]. Please contact [facility] at [phone] with questions or concerns regarding your invoice.   Our billing staff will not be able to assist you with questions regarding bills from these companies.  You will be contacted with the lab results as soon as they are available. The fastest way to get your results is to activate your My Chart account. Instructions are located on the last page of this paperwork. If you have not heard from [place] regarding the results in 2 weeks, please contact this office.

## 2015-09-28 NOTE — Progress Notes (Signed)
[facility] [address], [place] [address] [phone]  Date:  09/28/2015   Name:  Christy Collins   DOB:  02/06/1971   MRN:  [id number]  PCP:  Odette Fraction, MD    Chief Complaint: Headache and Itchy face   History of Present Illness:  This is a 45 y.o. female with PMH esophageal stricture, achalasia, anxiety, vitiligo who is presenting with allergic reaction. States woke this morning, about 4 hours ago, and her eyelids were itchy. Shortly after her mouth started itchy. Gradually her entire face because very itchy. Has puffy lower eyelids. No known allergies. Took a diflucan pill last night for vaginal yeast infection. This is not her first time taking diflucan. States she will take maybe every 6 months. Had a fruit salad for dinner last night, everything that she has had before. No sob, wheezing, throat tightness, oral swelling. Hasn't taken anything with her symptoms. Pt states benadryl makes her feel "high". She has never tried 2nd generation antihistamines.   Review of Systems:  Review of Systems See HPI  Patient Active Problem List   Diagnosis Date Noted  . Vitiligo   . ANXIETY 04/29/2008  . ACHALASIA s/p laparoscopic Heller myotomy and Dor Fundoplication [id number]  . ESOPHAGEAL STRICTURE 04/24/2008  . GERD 04/24/2008  . CONSTIPATION 04/24/2008  . NAUSEA 04/24/2008    Prior to Admission medications   Medication Sig Start Date End Date Taking? Authorizing Provider  Black Cohosh 40 MG CAPS Take 80 mg by mouth daily.   Yes Historical Provider, MD  fluconazole (DIFLUCAN) 150 MG tablet Take 150 mg by mouth daily.   Yes Historical Provider, MD  Multiple Vitamins-Minerals (MULTIPLE VITAMINS/WOMENS) tablet Take 1 tablet by mouth daily.   Yes Historical Provider, MD    Allergies  Allergen Reactions  . Other     Patient states that she cant take any pain meds and if she is put to sleep it makes her sick.    Past Surgical History   Procedure Laterality Date  . Tonsillectomy    . Tubal ligation    . Dilation and curettage of uterus    . Breast lumpectomy    . Dilitation & currettage/hystroscopy with versapoint resection  06/08/2012    Procedure: DILATATION & CURETTAGE/HYSTEROSCOPY WITH VERSAPOINT RESECTION;  Surgeon: Alwyn Pea, MD;  Location: [facility];  Service: Gynecology;  Laterality: N/A;  and novasure  . Esophageal manometry N/A 09/17/2012    Procedure: ESOPHAGEAL MANOMETRY (EM);  Surgeon: Sable Feil, MD;  Location: [facility];  Service: Endoscopy;  Laterality: N/A;  . Uterine fibroid surgery    . Heller myotomy N/A 11/05/2012    Procedure: LAPAROSCOPIC ESOPHAGEAL MYOTOMY AND ANTERIOR FUNDOPLICATION;  Surgeon: Odis Hollingshead, MD;  Location: [facility];  Service: General;  Laterality: N/A;  . Esophagogastroduodenoscopy N/A 11/05/2012    Procedure: ESOPHAGOGASTRODUODENOSCOPY (EGD);  Surgeon: Odis Hollingshead, MD;  Location: [facility];  Service: General;  Laterality: N/A;    Social History  Substance Use Topics  . Smoking status: Former Smoker -- 0.50 packs/day    Types: Cigarettes    Quit date: 11/05/2012  . Smokeless tobacco: Never Used  . Alcohol Use: No    Family History  Problem Relation Age of Onset  . Asthma Mother   . Hyperlipidemia Mother   . Asthma Daughter   . Cancer Maternal Grandmother   . Cancer Paternal Uncle   . Hypertension Paternal Uncle   . Hypertension Paternal Aunt   .  Diabetes Paternal Uncle   . Diabetes Paternal Aunt   . Rheumatic fever Daughter   . Alcohol abuse Father   . Hypertension Father     Medication list has been reviewed and updated.  Physical Examination:  Physical Exam  Constitutional: She is oriented to person, place, and time. She appears well-developed and well-nourished. No distress.  HENT:  Head: Normocephalic and atraumatic.  Right Ear: Hearing, tympanic membrane, external ear and ear canal normal.  Left Ear: Hearing, tympanic membrane, external  ear and ear canal normal.  Nose: Nose normal.  Mouth/Throat: Uvula is midline, oropharynx is clear and moist and mucous membranes are normal.  No oral swelling  Eyes: Conjunctivae, EOM and lids are normal. Pupils are equal, round, and reactive to light. Right eye exhibits no discharge. Left eye exhibits no discharge. No scleral icterus.  Mild swelling of bilateral lower lids, L>R  Cardiovascular: Normal rate, regular rhythm, normal heart sounds and normal pulses.   No murmur heard. Pulmonary/Chest: Effort normal and breath sounds normal. No respiratory distress. She has no wheezes. She has no rhonchi. She has no rales.  Musculoskeletal: Normal range of motion.  Lymphadenopathy:       Head (right side): No submental, no submandibular and no tonsillar adenopathy present.       Head (left side): No submental, no submandibular and no tonsillar adenopathy present.    She has no cervical adenopathy.  Neurological: She is alert and oriented to person, place, and time.  Skin: Skin is warm, dry and intact. No lesion and no rash noted.  Psychiatric: She has a normal mood and affect. Her speech is normal and behavior is normal. Thought content normal.   BP 110/70 mmHg  Pulse 63  Temp(Src) 98.3 F (36.8 C) (Oral)  Resp 16  Ht 5\' 4"  (1.626 m)  Wt 121 lb 12.8 oz (55.248 kg)  BMI 20.90 kg/m2  SpO2 98%  Assessment and Plan:  1. Allergic reaction, initial encounter Allergic reaction with facial itching and mild bilateral lower lid swelling. No oral swelling, n/v, diff breathing. Possible new allergy to diflucan. Caution with further use, added to allergy list. Take zyrtec. Discussed return precautions.   Benjaman Pott Drenda Freeze, MHS [facility]  09/28/2015

## 2016-12-09 ENCOUNTER — Encounter: Payer: Self-pay | Admitting: Family Medicine

## 2016-12-09 ENCOUNTER — Ambulatory Visit (INDEPENDENT_AMBULATORY_CARE_PROVIDER_SITE_OTHER): Payer: BLUE CROSS/BLUE SHIELD | Admitting: Family Medicine

## 2016-12-09 VITALS — BP 110/62 | HR 82 | Temp 98.1°F | Resp 18 | Ht 63.5 in | Wt 119.0 lb

## 2016-12-09 DIAGNOSIS — R300 Dysuria: Secondary | ICD-10-CM | POA: Diagnosis not present

## 2016-12-09 LAB — URINALYSIS, ROUTINE W REFLEX MICROSCOPIC
Bilirubin Urine: NEGATIVE
GLUCOSE, UA: NEGATIVE
HGB URINE DIPSTICK: NEGATIVE
Leukocytes, UA: NEGATIVE
NITRITE: POSITIVE — AB
Protein, ur: NEGATIVE
Specific Gravity, Urine: 1.02 (ref 1.001–1.035)
pH: 6 (ref 5.0–8.0)

## 2016-12-09 LAB — URINALYSIS, MICROSCOPIC ONLY
Casts: NONE SEEN [LPF]
Crystals: NONE SEEN [HPF]
RBC / HPF: NONE SEEN RBC/HPF (ref <=2)
WBC, UA: NONE SEEN WBC/HPF (ref <=5)
YEAST: NONE SEEN [HPF]

## 2016-12-09 MED ORDER — CIPROFLOXACIN HCL 500 MG PO TABS: 500.0000 mg | ORAL_TABLET | Freq: Two times a day (BID) | ORAL | 0 refills | Status: DC

## 2016-12-09 NOTE — Progress Notes (Signed)
Subjective:    Patient ID: Christy Collins, female    DOB: 1970-12-04, 46 y.o.   MRN: [id number]  HPI Patient presents with low back pain. It is been there for approximately 2-3 days. Starting 2 days ago she developed increased urinary frequency. She states that she is urinating 5 or more times just last night. She also reports hesitancy and urgency. After she urinates, she feels like she has to urinate again immediately but very little, out. She also has now developed dysuria for the last 24 hours. Urinalysis is unremarkable aside for some nitrates and ketones. She has no CVA tenderness. She is tender to palpation over her bladder anteriorly Past Medical History:  Diagnosis Date  . Achalasia   . Anemia   . Bacterial infection   . Constipation   . Eczema   . GERD (gastroesophageal reflux disease)    Chronic   . Hiatal hernia   . History of chicken pox   . Ovarian cyst   . PONV (postoperative nausea and vomiting)   . Stricture and stenosis of esophagus   . Vitiligo   . Yeast infection    Past Surgical History:  Procedure Laterality Date  . BREAST LUMPECTOMY    . DILATION AND CURETTAGE OF UTERUS    . DILITATION & CURRETTAGE/HYSTROSCOPY WITH VERSAPOINT RESECTION  06/08/2012   Procedure: DILATATION & CURETTAGE/HYSTEROSCOPY WITH VERSAPOINT RESECTION;  Surgeon: Alwyn Pea, MD;  Location: [facility];  Service: Gynecology;  Laterality: N/A;  and novasure  . ESOPHAGEAL MANOMETRY N/A 09/17/2012   Procedure: ESOPHAGEAL MANOMETRY (EM);  Surgeon: Sable Feil, MD;  Location: [facility];  Service: Endoscopy;  Laterality: N/A;  . ESOPHAGOGASTRODUODENOSCOPY N/A 11/05/2012   Procedure: ESOPHAGOGASTRODUODENOSCOPY (EGD);  Surgeon: Odis Hollingshead, MD;  Location: [facility];  Service: General;  Laterality: N/A;  . HELLER MYOTOMY N/A 11/05/2012   Procedure: LAPAROSCOPIC ESOPHAGEAL MYOTOMY AND ANTERIOR FUNDOPLICATION;  Surgeon: Odis Hollingshead, MD;  Location: [facility];  Service: General;  Laterality:  N/A;  . TONSILLECTOMY    . TUBAL LIGATION    . UTERINE FIBROID SURGERY     Current Outpatient Prescriptions on File Prior to Visit  Medication Sig Dispense Refill  . Black Cohosh 40 MG CAPS Take 80 mg by mouth daily.    . Multiple Vitamins-Minerals (MULTIPLE VITAMINS/WOMENS) tablet Take 1 tablet by mouth daily.     No current facility-administered medications on file prior to visit.    Allergies  Allergen Reactions  . Diflucan [Fluconazole] Itching  . Other     Patient states that she cant take any pain meds and if she is put to sleep it makes her sick.   Social History   Social History  . Marital status: Married    Spouse name: N/A  . Number of children: 3  . 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 Smoking status: Former Smoker    Packs/day: 0.50    Types: Cigarettes    Quit date: 11/05/2012  . Smokeless tobacco: Never Used  . Alcohol use No  . Drug use: No  . Sexual activity: Yes    Birth control/ protection: Surgical   Other Topics Concern  . Not on file   Social History Narrative  . No narrative on file      Review of Systems  All other systems reviewed and are negative.      Objective:  Physical Exam  Cardiovascular: Normal rate, regular rhythm and normal heart sounds.   No murmur heard. Pulmonary/Chest: Effort normal and breath sounds normal.  Abdominal: Soft. She exhibits no mass. There is tenderness. There is no rebound and no guarding.    Musculoskeletal:       Lumbar back: She exhibits pain. She exhibits normal range of motion, no tenderness, no bony tenderness, no swelling and no deformity.       Back:  Vitals reviewed.  Mildly tender to palpation in the area drawn on the diagram of the cervical       Assessment & Plan:  Dysuria - Plan: Urinalysis, Routine w reflex microscopic  Suspect urinary tract infection although urinalysis is not classic. Treat the  patient with Cipro 500 mg by mouth twice a day for 5 days. Recheck next week if no better or sooner if worse. Drink more fluids

## 2017-01-12 ENCOUNTER — Encounter: Payer: Self-pay | Admitting: Family Medicine

## 2017-01-12 ENCOUNTER — Ambulatory Visit (INDEPENDENT_AMBULATORY_CARE_PROVIDER_SITE_OTHER): Payer: BLUE CROSS/BLUE SHIELD | Admitting: Family Medicine

## 2017-01-12 VITALS — BP 112/72 | HR 70 | Temp 98.5°F | Resp 16 | Ht 63.5 in | Wt 116.0 lb

## 2017-01-12 DIAGNOSIS — L300 Nummular dermatitis: Secondary | ICD-10-CM

## 2017-01-12 DIAGNOSIS — Z0000 Encounter for general adult medical examination without abnormal findings: Secondary | ICD-10-CM | POA: Diagnosis not present

## 2017-01-12 LAB — LIPID PANEL
Cholesterol: 129 mg/dL (ref <200)
HDL: 61 mg/dL (ref >50)
LDL Cholesterol: 56 mg/dL (ref <100)
Total CHOL/HDL Ratio: 2.1 Ratio (ref <5.0)
Triglycerides: 59 mg/dL (ref <150)
VLDL: 12 mg/dL (ref <30)

## 2017-01-12 LAB — COMPLETE METABOLIC PANEL WITH GFR
ALBUMIN: 4.3 g/dL (ref 3.6–5.1)
ALK PHOS: 23 U/L — AB (ref 33–115)
ALT: 8 U/L (ref 6–29)
AST: 13 U/L (ref 10–35)
BUN: 13 mg/dL (ref 7–25)
CO2: 23 mmol/L (ref 20–31)
Calcium: 9.2 mg/dL (ref 8.6–10.2)
Chloride: 104 mmol/L (ref 98–110)
Creat: 0.9 mg/dL (ref 0.50–1.10)
GFR, EST NON AFRICAN AMERICAN: 77 mL/min (ref >=60)
GFR, Est African American: 89 mL/min (ref >=60)
GLUCOSE: 82 mg/dL (ref 70–99)
Potassium: 4.4 mmol/L (ref 3.5–5.3)
SODIUM: 138 mmol/L (ref 135–146)
Total Bilirubin: 0.6 mg/dL (ref 0.2–1.2)
Total Protein: 7 g/dL (ref 6.1–8.1)

## 2017-01-12 LAB — CBC WITH DIFFERENTIAL/PLATELET
BASOS ABS: 0 {cells}/uL (ref 0–200)
Basophils Relative: 0 %
EOS PCT: 1 %
Eosinophils Absolute: 85 cells/uL (ref 15–500)
HCT: 40 % (ref 35.0–45.0)
HEMOGLOBIN: 13.1 g/dL (ref 12.0–15.0)
Lymphocytes Relative: 17 %
Lymphs Abs: 1445 cells/uL (ref 850–3900)
MCH: 28.3 pg (ref 27.0–33.0)
MCHC: 32.8 g/dL (ref 32.0–36.0)
MCV: 86.4 fL (ref 80.0–100.0)
MONOS PCT: 6 %
MPV: 9.8 fL (ref 7.5–12.5)
Monocytes Absolute: 510 cells/uL (ref 200–950)
NEUTROS PCT: 76 %
Neutro Abs: 6460 cells/uL (ref 1500–7800)
PLATELETS: 216 10*3/uL (ref 140–400)
RBC: 4.63 MIL/uL (ref 3.80–5.10)
RDW: 12.9 % (ref 11.0–15.0)
WBC: 8.5 10*3/uL (ref 3.8–10.8)

## 2017-01-12 NOTE — Progress Notes (Signed)
Subjective:    Patient ID: Christy Collins, female    DOB: [birth year], 46 y.o.   MRN: [id number]  HPI Patient is a very pleasant 46 year old African-American female who presents today for complete physical exam. She sees a gynecologist who performs her breast exams and her Pap smears and also her mammogram. These are all up-to-date and have been performed within the last year. She has a hypopigmented rash on her vulva, her breast, some skin changes on her lips, on her neck, and in her scalp. In the past, I was concerned that she may have vitiligo. However the patient states the skin biopsy performed by her gynecologist did not reveal this. Discoid lupus would also be on the differential diagnosis. She has no systemic symptoms of lupus. She has never had pericarditis or pleurisy. She has never had any kidney problems or liver problems or hematuria. She denies any muscle pains or joint pains. Past Medical History:  Diagnosis Date  . Achalasia   . Anemia   . Bacterial infection   . Constipation   . Eczema   . GERD (gastroesophageal reflux disease)    Chronic   . Hiatal hernia   . History of chicken pox   . Ovarian cyst   . PONV (postoperative nausea and vomiting)   . Stricture and stenosis of esophagus   . Vitiligo   . Yeast infection    Past Surgical History:  Procedure Laterality Date  . BREAST LUMPECTOMY    . DILATION AND CURETTAGE OF UTERUS    . DILITATION & CURRETTAGE/HYSTROSCOPY WITH VERSAPOINT RESECTION  06/08/2012   Procedure: DILATATION & CURETTAGE/HYSTEROSCOPY WITH VERSAPOINT RESECTION;  Surgeon: Alwyn Pea, MD;  Location: [facility];  Service: Gynecology;  Laterality: N/A;  and novasure  . ESOPHAGEAL MANOMETRY N/A 09/17/2012   Procedure: ESOPHAGEAL MANOMETRY (EM);  Surgeon: Sable Feil, MD;  Location: [facility];  Service: Endoscopy;  Laterality: N/A;  . ESOPHAGOGASTRODUODENOSCOPY N/A 11/05/2012   Procedure: ESOPHAGOGASTRODUODENOSCOPY (EGD);  Surgeon: Odis Hollingshead, MD;  Location: [facility];  Service: General;  Laterality: N/A;  . HELLER MYOTOMY N/A 11/05/2012   Procedure: LAPAROSCOPIC ESOPHAGEAL MYOTOMY AND ANTERIOR FUNDOPLICATION;  Surgeon: Odis Hollingshead, MD;  Location: [facility];  Service: General;  Laterality: N/A;  . TONSILLECTOMY    . TUBAL LIGATION    . UTERINE FIBROID SURGERY     Current Outpatient Prescriptions on File Prior to Visit  Medication Sig Dispense Refill  . Black Cohosh 40 MG CAPS Take 80 mg by mouth daily.    . Multiple Vitamins-Minerals (MULTIPLE VITAMINS/WOMENS) tablet Take 1 tablet by mouth daily.     No current facility-administered medications on file prior to visit.    Allergies  Allergen Reactions  . Diflucan [Fluconazole] Itching  . Other     Patient states that she cant take any pain meds and if she is put to sleep it makes her sick.   Social History   Social History  . Marital status: Married    Spouse name: N/A  . Number of children: 3  . Years of education: N/A   Occupational History  . 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 Smoking status: Former Smoker    Packs/day: 0.50    Types: Cigarettes    Quit date: 11/05/2012  . Smokeless tobacco: Never Used  . Alcohol use No  . Drug use: No  . Sexual activity: Yes    Birth control/ protection: Surgical  Other Topics Concern  . Not on file   Social History Narrative  . No narrative on file   Family History  Problem Relation Age of Onset  . Asthma Mother   . Hyperlipidemia Mother   . Asthma Daughter   . Cancer Maternal Grandmother   . Cancer Paternal Uncle   . Hypertension Paternal Uncle   . Hypertension Paternal Aunt   . Diabetes Paternal Uncle   . Diabetes Paternal Aunt   . Rheumatic fever Daughter   . Alcohol abuse Father   . Hypertension Father       Review of Systems  All other systems reviewed and are negative.      Objective:   Physical Exam  Constitutional: She is oriented to person,  place, and time. She appears well-developed and well-nourished. No distress.  HENT:  Head: Normocephalic and atraumatic.  Right Ear: External ear normal.  Left Ear: External ear normal.  Nose: Nose normal.  Mouth/Throat: Oropharynx is clear and moist. No oropharyngeal exudate.  Eyes: Conjunctivae and EOM are normal. Pupils are equal, round, and reactive to light. Right eye exhibits no discharge. Left eye exhibits no discharge. No scleral icterus.  Neck: Normal range of motion. Neck supple. No JVD present.  Cardiovascular: Normal rate, regular rhythm and normal heart sounds.  Exam reveals no gallop and no friction rub.   No murmur heard. Pulmonary/Chest: Effort normal and breath sounds normal. No respiratory distress. She has no wheezes. She has no rales. She exhibits no tenderness.  Abdominal: Soft. Bowel sounds are normal. She exhibits no distension and no mass. There is no tenderness. There is no rebound and no guarding.  Musculoskeletal: Normal range of motion. She exhibits no edema, tenderness or deformity.  Lymphadenopathy:    She has no cervical adenopathy.  Neurological: She is alert and oriented to person, place, and time. She has normal reflexes. She displays normal reflexes. No cranial nerve deficit. She exhibits normal muscle tone. Coordination normal.  Skin: Skin is warm. Rash noted. She is not diaphoretic. No pallor.  Psychiatric: She has a normal mood and affect. Her behavior is normal. Judgment and thought content normal.  Vitals reviewed.         Assessment & Plan:  General medical exam - Plan: CBC with Differential/Platelet, COMPLETE METABOLIC PANEL WITH GFR, Lipid panel  Discoid eczema - Plan: ANA  Physical exam today is completely normal other than the hypopigmented rash. I am concerned about vitiligo versus discoid lupus. Thankfully the patient does not have any systemic symptoms of lupus. I will check an ANA. In addition I will check a CBC, CMP, fasting lipid  panel. I have recommended the patient either see a dermatologist or allow [place] to perform punch biopsies of several of the areas of rash to evaluate further. She elects to have [place] perform a punch biopsies here and will schedule that when she gets back from her upcoming vacation

## 2017-01-12 NOTE — Progress Notes (Signed)
   Subjective:    Patient ID: Christy Collins, female    DOB: Feb 08, 1971, 46 y.o.   MRN: [id number]  HPI  Past Medical History:  Diagnosis Date  . Achalasia   . Anemia   . Bacterial infection   . Constipation   . Eczema   . GERD (gastroesophageal reflux disease)    Chronic   . Hiatal hernia   . History of chicken pox   . Ovarian cyst   . PONV (postoperative nausea and vomiting)   . Stricture and stenosis of esophagus   . Vitiligo   . Yeast infection    Past Surgical History:  Procedure Laterality Date  . BREAST LUMPECTOMY    . DILATION AND CURETTAGE OF UTERUS    . DILITATION & CURRETTAGE/HYSTROSCOPY WITH VERSAPOINT RESECTION  06/08/2012   Procedure: DILATATION & CURETTAGE/HYSTEROSCOPY WITH VERSAPOINT RESECTION;  Surgeon: Alwyn Pea, MD;  Location: [facility];  Service: Gynecology;  Laterality: N/A;  and novasure  . ESOPHAGEAL MANOMETRY N/A 09/17/2012   Procedure: ESOPHAGEAL MANOMETRY (EM);  Surgeon: Sable Feil, MD;  [facility];  Service: Endoscopy;  Laterality: N/A;  . ESOPHAGOGASTRODUODENOSCOPY N/A 11/05/2012   Procedure: ESOPHAGOGASTRODUODENOSCOPY (EGD);  Surgeon: Odis Hollingshead, MD;  Location: [facility];  Service: General;  Laterality: N/A;  . HELLER MYOTOMY N/A 11/05/2012   Procedure: LAPAROSCOPIC ESOPHAGEAL MYOTOMY AND ANTERIOR FUNDOPLICATION;  Surgeon: Odis Hollingshead, MD;  Location: [facility];  Service: General;  Laterality: N/A;  . TONSILLECTOMY    . TUBAL LIGATION    . UTERINE FIBROID SURGERY     Current Outpatient Prescriptions on File Prior to Visit  Medication Sig Dispense Refill  . Black Cohosh 40 MG CAPS Take 80 mg by mouth daily.    . Multiple Vitamins-Minerals (MULTIPLE VITAMINS/WOMENS) tablet Take 1 tablet by mouth daily.     No current facility-administered medications on file prior to visit.    Allergies  Allergen Reactions  . Diflucan [Fluconazole] Itching  . Other     Patient states that she cant take any pain meds and if she is put  to sleep it makes her sick.   Social History   Social History  . Marital status: Married    Spouse name: N/A  . Number of children: 3  . Years of education: N/A   Occupational History  . 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 Smoking status: Former Smoker    Packs/day: 0.50    Types: Cigarettes    Quit date: 11/05/2012  . Smokeless tobacco: Never Used  . Alcohol use No  . Drug use: No  . Sexual activity: Yes    Birth control/ protection: Surgical   Other Topics Concern  . Not on file   Social History Narrative  . No narrative on file                                                                    Review of Systems     Objective:   Physical Exam        Assessment & Plan:

## 2017-01-13 LAB — ANA: Anti Nuclear Antibody(ANA): NEGATIVE

## 2017-01-16 ENCOUNTER — Encounter: Payer: Self-pay | Admitting: Family Medicine

## 2017-01-19 ENCOUNTER — Ambulatory Visit (HOSPITAL_COMMUNITY)
Admission: EM | Admit: 2017-01-19 | Discharge: 2017-01-19 | Disposition: A | Discharge status: Home / Self Care | Payer: BLUE CROSS/BLUE SHIELD | Attending: Emergency Medicine | Admitting: Emergency Medicine

## 2017-01-19 ENCOUNTER — Encounter (HOSPITAL_COMMUNITY): Payer: Self-pay | Admitting: Emergency Medicine

## 2017-01-19 DIAGNOSIS — R51 Headache: Secondary | ICD-10-CM | POA: Diagnosis not present

## 2017-01-19 DIAGNOSIS — Z825 Family history of asthma and other chronic lower respiratory diseases: Secondary | ICD-10-CM | POA: Diagnosis not present

## 2017-01-19 DIAGNOSIS — Z87891 Personal history of nicotine dependence: Secondary | ICD-10-CM | POA: Diagnosis not present

## 2017-01-19 DIAGNOSIS — J029 Acute pharyngitis, unspecified: Secondary | ICD-10-CM | POA: Insufficient documentation

## 2017-01-19 DIAGNOSIS — Z8249 Family history of ischemic heart disease and other diseases of the circulatory system: Secondary | ICD-10-CM | POA: Insufficient documentation

## 2017-01-19 DIAGNOSIS — H9201 Otalgia, right ear: Secondary | ICD-10-CM | POA: Insufficient documentation

## 2017-01-19 LAB — POCT RAPID STREP A: Streptococcus, Group A Screen (Direct): NEGATIVE

## 2017-01-19 LAB — POCT INFECTIOUS MONO SCREEN: Mono Screen: NEGATIVE

## 2017-01-19 MED ORDER — FLUTICASONE PROPIONATE 50 MCG/ACT NA SUSP: 2.0000 | Freq: Every day | NASAL | 0 refills | Status: DC

## 2017-01-19 MED ORDER — MAGIC MOUTHWASH W/LIDOCAINE: 10.0000 mL | Freq: Three times a day (TID) | ORAL | 0 refills | Status: DC | PRN

## 2017-01-19 NOTE — ED Provider Notes (Signed)
HPI  SUBJECTIVE:  Patient reports right-sided sore throat starting 1 week ago. Sx worse with swallowing.  Sx better with Tylenol combined with ibuprofen.  No fevers     No Cough, but reports nasal congestion, rhinorrhea, postnasal drip. Reports right ear pain and a mild right-sided headache. No change in hearing, otorrhea.  No Myalgias No Rash + Excessive fatigue     + Recent Strep Exposure- daughter currently with strep. No exposure to mono  No Abdominal Pain No reflux sxs + Allergy sxs- reports itchy, watery eyes  No Breathing difficulty, sensation of throat swelling shut, muffled hot potato voice No Drooling No Trismus No abx in past month.  + antipyretic in past 4-6 hrs  Also reports bilateral conjunctivitis she was started on antibiotic drops yesterday for this Past medical history of recurrent strep status post tonsillectomy, GERD which is not bothering her and allergies. No history of mono, diabetes, hypertension LMP: 6/10. Denies possibility of being pregnant Status post bilateral tubal ligation. WUX:[id number], Cammie Mcgee, MD    Past Medical History:  Diagnosis Date  . Achalasia   . Anemia   . Bacterial infection   . Constipation   . Eczema   . GERD (gastroesophageal reflux disease)    Chronic   . Hiatal hernia   . History of chicken pox   . Ovarian cyst   . PONV (postoperative nausea and vomiting)   . Stricture and stenosis of esophagus   . Vitiligo   . Yeast infection     Past Surgical History:  Procedure Laterality Date  . BREAST LUMPECTOMY    . DILATION AND CURETTAGE OF UTERUS    . DILITATION & CURRETTAGE/HYSTROSCOPY WITH VERSAPOINT RESECTION  06/08/2012   Procedure: DILATATION & CURETTAGE/HYSTEROSCOPY WITH VERSAPOINT RESECTION;  Surgeon: Alwyn Pea, MD;  Location: [facility];  Service: Gynecology;  Laterality: N/A;  and novasure  . ESOPHAGEAL MANOMETRY N/A 09/17/2012   Procedure: ESOPHAGEAL MANOMETRY (EM);  Surgeon: Sable Feil, MD;  [facility];  Service: Endoscopy;  Laterality: N/A;  . ESOPHAGOGASTRODUODENOSCOPY N/A 11/05/2012   Procedure: ESOPHAGOGASTRODUODENOSCOPY (EGD);  Surgeon: Odis Hollingshead, MD;  Location: [facility];  Service: General;  Laterality: N/A;  . HELLER MYOTOMY N/A 11/05/2012   Procedure: LAPAROSCOPIC ESOPHAGEAL MYOTOMY AND ANTERIOR FUNDOPLICATION;  Surgeon: Odis Hollingshead, MD;  Location: [facility];  Service: General;  Laterality: N/A;  . TONSILLECTOMY    . TUBAL LIGATION    . UTERINE FIBROID SURGERY      Family History  Problem Relation Age of Onset  . Asthma Mother   . Hyperlipidemia Mother   . Alcohol abuse Father   . Hypertension Father   . Asthma Daughter   . Cancer Maternal Grandmother   . Cancer Paternal Uncle   . Hypertension Paternal Uncle   . Hypertension Paternal Aunt   . Diabetes Paternal Uncle   . Diabetes Paternal Aunt   . Rheumatic fever Daughter     Social History  Substance Use Topics  . Smoking status: Former Smoker    Packs/day: 0.50    Types: Cigarettes    Quit date: 11/05/2012  . Smokeless tobacco: Never Used  . Alcohol use No    No current facility-administered medications for this encounter.   Current Outpatient Prescriptions:  .  OVER THE COUNTER MEDICATION, Steroid eye drop she started yesterday, Disp: , Rfl:  .  Black Cohosh 40 MG CAPS, Take 80 mg by mouth daily., Disp: , Rfl:  .  fluticasone (FLONASE) 50  MCG/ACT nasal spray, Place 2 sprays into both nostrils daily., Disp: 16 g, Rfl: 0 .  magic mouthwash w/lidocaine SOLN, Take 10 mLs by mouth 3 (three) times daily as needed for mouth pain. Swish and spit do not swallow, Disp: 200 mL, Rfl: 0 .  Multiple Vitamins-Minerals (MULTIPLE VITAMINS/WOMENS) tablet, Take 1 tablet by mouth daily., Disp: , Rfl:   Allergies  Allergen Reactions  . Other     Patient states that she cant take any pain meds and if she is put to sleep it makes her sick.     ROS  As noted in HPI.   Physical Exam  BP (!) 155/79 (BP  Location: Left Arm)   Pulse 88   Temp 98.2 F (36.8 C) (Oral)   Resp 18   LMP 12/20/2016   SpO2 97%   Constitutional: Well developed, well nourished, no acute distress Normal voice.  Eyes:  EOMI, conjunctiva normal bilaterally HENT: Normocephalic, atraumatic,mucus membranes moist.TMs normal bilaterally + clear nasal congestion with erythematous, swollen turbinates. No sinus tenderness. +erythematous oropharynx. tonsils surgically absent  Uvula midline. Positive cobblestoning, no obvious postnasal drip Respiratory: Normal inspiratory effort Cardiovascular: Normal rate, no murmurs, rubs, gallops GI: nondistended, nontender. No appreciable splenomegaly skin: No rash, skin intact Lymph: + Shotty cervical LN on the right more than left Musculoskeletal: no deformities Neurologic: Alert & oriented x 3, no focal neuro deficits Psychiatric: Speech and behavior appropriate.   ED Course   Medications - No data to display  Orders Placed This Encounter  Procedures  . Culture, group A strep    Standing Status:   Standing    Number of Occurrences:   1  . POCT rapid strep A [facility])    Standing Status:   Standing    Number of Occurrences:   1  . Infectious mono screen, POC    Standing Status:   Standing    Number of Occurrences:   1    Results for orders placed or performed during the hospital encounter of 01/19/17 (from the past 24 hour(s))  POCT rapid strep A [facility])     Status: None   Collection Time: 01/19/17  1:40 PM  Result Value Ref Range   Streptococcus, Group A Screen (Direct) NEGATIVE NEGATIVE  Infectious mono screen, POC     Status: None   Collection Time: 01/19/17  2:30 PM  Result Value Ref Range   Mono Screen NEGATIVE NEGATIVE   No results found.  ED Clinical Impression  Sore throat   ED Assessment/Plan  Mono neg.  Rapid strep negative. Obtaining throat culture to guide antibiotic treatment. Discussed this with patient. We'll contact them if  culture is positive, and will call in Appropriate antibiotics. Patient home with ibuprofen, Tylenol, Benadryl/Maalox mixture, Magic mouthwash if the Benadryl/Maalox mixture does not work, Designer, industrial/product. She is to start some Claritin, Allegra or Zyrtec as she is reporting allergy symptoms. Patient to followup with PMD when necessary, To the ER if she gets worse.  Discussed labs,  MDM, plan and followup with patient. Discussed sn/sx that should prompt return to the ED. Patient agrees with plan.  Meds ordered this encounter  Medications  . OVER THE COUNTER MEDICATION    Sig: Steroid eye drop she started yesterday  . magic mouthwash w/lidocaine SOLN    Sig: Take 10 mLs by mouth 3 (three) times daily as needed for mouth pain. Swish and spit do not swallow    Dispense:  200 mL  Refill:  0  . fluticasone (FLONASE) 50 MCG/ACT nasal spray    Sig: Place 2 sprays into both nostrils daily.    Dispense:  16 g    Refill:  0     *This clinic note was created using Lobbyist. Therefore, there may be occasional mistakes despite careful proofreading.    Melynda Ripple, MD 01/19/17 1500

## 2017-01-19 NOTE — Discharge Instructions (Signed)
your rapid strep was negative today, so we have sent off a throat culture. Your Monospot was negative. We will contact you and call in the appropriate antibiotics if your culture comes back positive for an infection requiring antibiotic treatment.  Give [place] a working phone number.  If you were given a prescription for antibiotics, you may want to wait and fill it until you know the results of the culture.  1 gram of Tylenol and 400- 600 mg ibuprofen together 3-4 times a day as needed for pain.  Make sure you drink plenty of extra fluids.  Some people find salt water gargles and  Traditional Medicinal's "Throat Coat" tea helpful. Take 5 mL of liquid Benadryl and 5 mL of Maalox. Mix it together, and then hold it in your mouth for as long as you can and then swallow. You may do this 4 times a day.  If this does not help, then use the Magic mouthwash. Do not swallow this. Social Claritin, Allegra, Zyrtec and the Flonase which will help your sinuses and allergy symptoms.  Go to www.goodrx.com to look up your medications. This will give you a list of where you can find your prescriptions at the most affordable prices. Or ask the pharmacist what the cash price is. This can be less expensive than what you would pay with insurance.

## 2017-01-19 NOTE — ED Triage Notes (Signed)
Eyes were draining yesterday, since Friday has had sore throat and right ear pain.  Saw pcp on Thursday, but thought patient to have allergies.  During this time, patient has been on a plane as well.  Patient did see eye doctor yesterday.

## 2017-01-21 LAB — CULTURE, GROUP A STREP (THRC)

## 2017-02-17 ENCOUNTER — Ambulatory Visit: Payer: BLUE CROSS/BLUE SHIELD | Admitting: Family Medicine

## 2017-03-24 ENCOUNTER — Encounter: Payer: Self-pay | Admitting: Family Medicine

## 2017-04-25 ENCOUNTER — Encounter: Payer: Self-pay | Admitting: Family Medicine

## 2017-05-24 ENCOUNTER — Encounter: Payer: Self-pay | Admitting: Family Medicine

## 2017-08-04 ENCOUNTER — Encounter: Payer: Self-pay | Admitting: Family Medicine

## 2017-08-04 ENCOUNTER — Ambulatory Visit: Payer: BLUE CROSS/BLUE SHIELD | Admitting: Family Medicine

## 2017-08-04 VITALS — BP 118/68 | HR 80 | Temp 98.2°F | Resp 16 | Ht 63.5 in | Wt 123.0 lb

## 2017-08-04 DIAGNOSIS — B9689 Other specified bacterial agents as the cause of diseases classified elsewhere: Secondary | ICD-10-CM

## 2017-08-04 DIAGNOSIS — J019 Acute sinusitis, unspecified: Secondary | ICD-10-CM | POA: Diagnosis not present

## 2017-08-04 MED ORDER — FLUCONAZOLE 150 MG PO TABS: 150.0000 mg | ORAL_TABLET | Freq: Once | ORAL | 0 refills | Status: DC

## 2017-08-04 MED ORDER — FLUCONAZOLE 150 MG PO TABS: 150.0000 mg | ORAL_TABLET | Freq: Once | ORAL | 0 refills | Status: AC

## 2017-08-04 MED ORDER — AMOXICILLIN-POT CLAVULANATE 875-125 MG PO TABS: 1.0000 | ORAL_TABLET | Freq: Two times a day (BID) | ORAL | 0 refills | Status: DC

## 2017-08-04 NOTE — Progress Notes (Signed)
Subjective:    Patient ID: Christy Collins, female    DOB: 03/18/1971, 47 y.o.   MRN: [id number]  HPI  Symptoms began 2-1/2 weeks ago. Symptoms include rhinorrhea, head congestion, postnasal drip, sore scratchy throat. Her last few days she's developed pain and pressure in her frontal and maxillary sinuses. She has lost her sense of smell and she has a worsening headache. She has a cough due to postnasal drip. She denies any fevers or chills or shortness of breath Past Medical History:  Diagnosis Date  . Achalasia   . Anemia   . Bacterial infection   . Constipation   . Eczema   . GERD (gastroesophageal reflux disease)    Chronic   . Hiatal hernia   . History of chicken pox   . Ovarian cyst   . PONV (postoperative nausea and vomiting)   . Stricture and stenosis of esophagus   . Vitiligo   . Yeast infection    Past Surgical History:  Procedure Laterality Date  . BREAST LUMPECTOMY    . DILATION AND CURETTAGE OF UTERUS    . DILITATION & CURRETTAGE/HYSTROSCOPY WITH VERSAPOINT RESECTION  06/08/2012   Procedure: DILATATION & CURETTAGE/HYSTEROSCOPY WITH VERSAPOINT RESECTION;  Surgeon: Alwyn Pea, MD;  Location: [facility];  Service: Gynecology;  Laterality: N/A;  and novasure  . ESOPHAGEAL MANOMETRY N/A 09/17/2012   Procedure: ESOPHAGEAL MANOMETRY (EM);  Surgeon: Sable Feil, MD;  [facility]: [facility];  Service: Endoscopy;  Laterality: N/A;  . ESOPHAGOGASTRODUODENOSCOPY N/A 11/05/2012   Procedure: ESOPHAGOGASTRODUODENOSCOPY (EGD);  Surgeon: Odis Hollingshead, MD;  Location: [facility];  Service: General;  Laterality: N/A;  . HELLER MYOTOMY N/A 11/05/2012   Procedure: LAPAROSCOPIC ESOPHAGEAL MYOTOMY AND ANTERIOR FUNDOPLICATION;  Surgeon: Odis Hollingshead, MD;  Location: [facility];  Service: General;  Laterality: N/A;  . TONSILLECTOMY    . TUBAL LIGATION    . UTERINE FIBROID SURGERY     Current Outpatient Medications on File Prior to Visit  Medication Sig Dispense Refill  . Black  Cohosh 40 MG CAPS Take 80 mg by mouth daily.    . Multiple Vitamins-Minerals (MULTIPLE VITAMINS/WOMENS) tablet Take 1 tablet by mouth daily.     No current facility-administered medications on file prior to visit.    Allergies  Allergen Reactions  . Other     Patient states that she cant take any pain meds and if she is put to sleep it makes her sick.   Social History   Socioeconomic History  . Marital status: Married    Spouse name: Not on file  . Number of children: 3  . Years of education: Not on file  . Highest education level: Not on file  Social Needs  . Financial resource strain: Not on file  . Food insecurity - worry: Not on file  . Food insecurity - inability: Not on file  . Transportation needs - medical: Not on file  . Transportation needs - non-medical: Not on file  Occupational History  . Occupation: CAP CASE MANAGER    Employer: [employer]  Tobacco Use  . Smoking status: Former Smoker    Packs/day: 0.50    Types: Cigarettes    Last attempt to quit: 11/05/2012    Years since quitting: 4.7  . Smokeless tobacco: Never Used  Substance and Sexual Activity  . Alcohol use: No  . Drug use: No  . Sexual activity: Yes    Birth control/protection: Surgical  Other Topics Concern  .  Not on file  Social History Narrative  . Not on file     Review of Systems  All other systems reviewed and are negative.      Objective:   Physical Exam  HENT:  Right Ear: Tympanic membrane and ear canal normal.  Left Ear: Tympanic membrane and ear canal normal.  Nose: Mucosal edema and rhinorrhea present. Right sinus exhibits maxillary sinus tenderness and frontal sinus tenderness. Left sinus exhibits maxillary sinus tenderness and frontal sinus tenderness.  Eyes: Conjunctivae are normal.  Neck: Neck supple.  Cardiovascular: Normal rate, regular rhythm and normal heart sounds.  Pulmonary/Chest: Effort normal and breath sounds normal. No respiratory distress. She has no  wheezes. She has no rales.  Lymphadenopathy:    She has no cervical adenopathy.  Vitals reviewed.         Assessment & Plan:  Acute bacterial rhinosinusitis - Plan: amoxicillin-clavulanate (AUGMENTIN) 875-125 MG tablet, fluconazole (DIFLUCAN) 150 MG tablet, DISCONTINUED: fluconazole (DIFLUCAN) 150 MG tablet  Begin Augmentin 875 mg by mouth twice a day for 10 days. Patient also requests Diflucan for possible yeast infections that develop after she takes Augmentin which I will gladly give her.

## 2017-11-06 ENCOUNTER — Encounter: Payer: Self-pay | Admitting: Family Medicine

## 2018-01-23 ENCOUNTER — Emergency Department (HOSPITAL_COMMUNITY)
Admission: EM | Admit: 2018-01-23 | Discharge: 2018-01-24 | Disposition: A | Discharge status: Home / Self Care | Payer: BLUE CROSS/BLUE SHIELD | Attending: Emergency Medicine | Admitting: Emergency Medicine

## 2018-01-23 ENCOUNTER — Encounter (HOSPITAL_COMMUNITY): Payer: Self-pay | Admitting: Family Medicine

## 2018-01-23 ENCOUNTER — Encounter (HOSPITAL_COMMUNITY): Payer: Self-pay | Admitting: Emergency Medicine

## 2018-01-23 ENCOUNTER — Ambulatory Visit (HOSPITAL_COMMUNITY)
Admission: EM | Admit: 2018-01-23 | Discharge: 2018-01-23 | Disposition: A | Discharge status: Home / Self Care | Payer: BLUE CROSS/BLUE SHIELD | Source: Home / Self Care | Attending: Family Medicine | Admitting: Family Medicine

## 2018-01-23 ENCOUNTER — Other Ambulatory Visit: Payer: Self-pay

## 2018-01-23 DIAGNOSIS — Z79899 Other long term (current) drug therapy: Secondary | ICD-10-CM | POA: Insufficient documentation

## 2018-01-23 DIAGNOSIS — R1011 Right upper quadrant pain: Secondary | ICD-10-CM | POA: Diagnosis not present

## 2018-01-23 DIAGNOSIS — R109 Unspecified abdominal pain: Secondary | ICD-10-CM

## 2018-01-23 DIAGNOSIS — R1031 Right lower quadrant pain: Secondary | ICD-10-CM | POA: Diagnosis not present

## 2018-01-23 DIAGNOSIS — Z87891 Personal history of nicotine dependence: Secondary | ICD-10-CM | POA: Insufficient documentation

## 2018-01-23 LAB — COMPREHENSIVE METABOLIC PANEL
ALBUMIN: 3.8 g/dL (ref 3.5–5.0)
ALK PHOS: 21 U/L — AB (ref 38–126)
ALT: 14 U/L (ref 0–44)
AST: 17 U/L (ref 15–41)
Anion gap: 6 (ref 5–15)
BUN: 13 mg/dL (ref 6–20)
CALCIUM: 8.7 mg/dL — AB (ref 8.9–10.3)
CO2: 29 mmol/L (ref 22–32)
CREATININE: 0.93 mg/dL (ref 0.44–1.00)
Chloride: 103 mmol/L (ref 98–111)
GFR calc Af Amer: >60 mL/min (ref >60)
GFR calc non Af Amer: >60 mL/min (ref >60)
GLUCOSE: 128 mg/dL — AB (ref 70–99)
Potassium: 3.7 mmol/L (ref 3.5–5.1)
SODIUM: 138 mmol/L (ref 135–145)
Total Bilirubin: 0.2 mg/dL — ABNORMAL LOW (ref 0.3–1.2)
Total Protein: 6.5 g/dL (ref 6.5–8.1)

## 2018-01-23 LAB — I-STAT BETA HCG BLOOD, ED (MC, WL, AP ONLY)

## 2018-01-23 LAB — URINALYSIS, ROUTINE W REFLEX MICROSCOPIC
Bilirubin Urine: NEGATIVE
Glucose, UA: NEGATIVE mg/dL
HGB URINE DIPSTICK: NEGATIVE
Ketones, ur: NEGATIVE mg/dL
Leukocytes, UA: NEGATIVE
Nitrite: NEGATIVE
Protein, ur: NEGATIVE mg/dL
SPECIFIC GRAVITY, URINE: 1.029 (ref 1.005–1.030)
pH: 5 (ref 5.0–8.0)

## 2018-01-23 LAB — CBC
HCT: 36.4 % (ref 36.0–46.0)
HEMOGLOBIN: 12.2 g/dL (ref 12.0–15.0)
MCH: 29.2 pg (ref 26.0–34.0)
MCHC: 33.5 g/dL (ref 30.0–36.0)
MCV: 87.1 fL (ref 78.0–100.0)
PLATELETS: 230 10*3/uL (ref 150–400)
RBC: 4.18 MIL/uL (ref 3.87–5.11)
RDW: 12.1 % (ref 11.5–15.5)
WBC: 6.8 10*3/uL (ref 4.0–10.5)

## 2018-01-23 LAB — POCT URINALYSIS DIP (DEVICE)
Bilirubin Urine: NEGATIVE
GLUCOSE, UA: NEGATIVE mg/dL
Hgb urine dipstick: NEGATIVE
Ketones, ur: NEGATIVE mg/dL
Leukocytes, UA: NEGATIVE
NITRITE: NEGATIVE
Protein, ur: NEGATIVE mg/dL
Specific Gravity, Urine: >=1.03 (ref 1.005–1.030)
UROBILINOGEN UA: 1 mg/dL (ref 0.0–1.0)
pH: 6 (ref 5.0–8.0)

## 2018-01-23 LAB — POCT PREGNANCY, URINE: PREG TEST UR: NEGATIVE

## 2018-01-23 LAB — LIPASE, BLOOD: Lipase: 32 U/L (ref 11–51)

## 2018-01-23 NOTE — ED Provider Notes (Signed)
[facility]   [id number] 01/23/18 Arrival Time: 1507  SUBJECTIVE:  Christy Collins is a 47 y.o. female hx significant for GERD, esophagus stricture, achalasia, hiatal hernia, and ovarian cyst who presents with complaint of abdominal discomfort that began abruptly x 1 week.  Denies a precipitating event, or trauma.  Recent trip to [place] and admits to drinking alcohol.  Pain diffuse about the abdomen.  Describes as worsening, intermittent and "contraction-like" in character.  Has tried reflux, and maalox medications without relief.  Worse with eating.  Denies alleviating factors.  Denies similar symptoms in the past.  Last BM this morning and normal for patient.  Complains of feeling full, nausea, polyuria, and flank pain.  Denies fever, chills, appetite changes, weight changes, vomiting, chest pain, SOB, diarrhea, constipation, hematochezia, melena, dysuria, difficulty urinating, increased urgency, loss of bowel or bladder function.  No LMP recorded. (Menstrual status: Irregular Periods). Tubal ligation  ROS: As per HPI.  Past Medical History:  Diagnosis Date  . Achalasia   . Anemia   . Bacterial infection   . Constipation   . Eczema   . GERD (gastroesophageal reflux disease)    Chronic   . Hiatal hernia   . History of chicken pox   . Ovarian cyst   . PONV (postoperative nausea and vomiting)   . Stricture and stenosis of esophagus   . Vitiligo   . Yeast infection    Past Surgical History:  Procedure Laterality Date  . BREAST LUMPECTOMY    . DILATION AND CURETTAGE OF UTERUS    . DILITATION & CURRETTAGE/HYSTROSCOPY WITH VERSAPOINT RESECTION  06/08/2012   Procedure: DILATATION & CURETTAGE/HYSTEROSCOPY WITH VERSAPOINT RESECTION;  Surgeon: Alwyn Pea, MD;  Location: [facility];  Service: Gynecology;  Laterality: N/A;  and novasure  . ESOPHAGEAL MANOMETRY N/A 09/17/2012   Procedure: ESOPHAGEAL MANOMETRY (EM);  Surgeon: Sable Feil, MD;  Location: [facility];   Service: Endoscopy;  Laterality: N/A;  . ESOPHAGOGASTRODUODENOSCOPY N/A 11/05/2012   Procedure: ESOPHAGOGASTRODUODENOSCOPY (EGD);  Surgeon: Odis Hollingshead, MD;  Location: [facility];  Service: General;  Laterality: N/A;  . HELLER MYOTOMY N/A 11/05/2012   Procedure: LAPAROSCOPIC ESOPHAGEAL MYOTOMY AND ANTERIOR FUNDOPLICATION;  Surgeon: Odis Hollingshead, MD;  Location: [facility];  Service: General;  Laterality: N/A;  . TONSILLECTOMY    . TUBAL LIGATION    . UTERINE FIBROID SURGERY     Allergies  Allergen Reactions  . Other     Patient states that she cant take any pain meds and if she is put to sleep it makes her sick.   No current facility-administered medications on file prior to encounter.    Current Outpatient Medications on File Prior to Encounter  Medication Sig Dispense Refill  . Black Cohosh 40 MG CAPS Take 80 mg by mouth daily.    . Multiple Vitamins-Minerals (MULTIPLE VITAMINS/WOMENS) tablet Take 1 tablet by mouth daily.    Marland Kitchen amoxicillin-clavulanate (AUGMENTIN) 875-125 MG tablet Take 1 tablet by mouth 2 (two) times daily. 20 tablet 0   Social History   Socioeconomic History  . Marital status: Married    Spouse name: Not on file  . Number of children: 3  . Years of education: Not on file  . Highest education level: Not on file  Occupational History  . Occupation: CAP CASE MANAGER    Employer: [employer]  Social Needs  . Financial resource strain: Not on file  . Food insecurity:    Worry: Not on  file    Inability: Not on file  . Transportation needs:    Medical: Not on file    Non-medical: Not on file  Tobacco Use  . Smoking status: Former Smoker    Packs/day: 0.50    Types: Cigarettes    Last attempt to quit: 11/05/2012    Years since quitting: 5.2  . Smokeless tobacco: Never Used  Substance and Sexual Activity  . Alcohol use: No  . Drug use: No  . Sexual activity: Yes    Birth control/protection: Surgical  Lifestyle  . Physical activity:    Days per  week: Not on file    Minutes per session: Not on file  . Stress: Not on file  Relationships  . Social connections:    Talks on phone: Not on file    Gets together: Not on file    Attends religious service: Not on file    Active member of club or organization: Not on file    Attends meetings of clubs or organizations: Not on file    Relationship status: Not on file  . Intimate partner violence:    Fear of current or ex partner: Not on file    Emotionally abused: Not on file    Physically abused: Not on file    Forced sexual activity: Not on file  Other Topics Concern  . Not on file  Social History Narrative  . Not on file   Family History  Problem Relation Age of Onset  . Asthma Mother   . Hyperlipidemia Mother   . Alcohol abuse Father   . Hypertension Father   . Asthma Daughter   . Cancer Maternal Grandmother   . Cancer Paternal Uncle   . Hypertension Paternal Uncle   . Hypertension Paternal Aunt   . Diabetes Paternal Uncle   . Diabetes Paternal Aunt   . Rheumatic fever Daughter      OBJECTIVE:  Vitals:   01/23/18 1522  BP: 121/73  Pulse: 76  Resp: 16  Temp: 98.2 F (36.8 C)  TempSrc: Oral  SpO2: 100%    General appearance: AOx3 in no acute distress; sitting on exam table comfortably HEENT: NCAT.  Oropharynx clear.  Lungs: clear to auscultation bilaterally without adventitious breath sounds Heart: regular rate and rhythm.  Radial pulses 2+ symmetrical bilaterally Abdomen: soft, non-distended; hypoactive bowel sounds; tender to deep palpation about the RLQ and LLQ; negative Murphy's sign; negative rebound; no guarding Back: no CVA tenderness Extremities: no edema; symmetrical with no gross deformities Skin: warm and dry Neurologic: normal gait Psychological: alert and cooperative; anxious mood and affect  Labs: Results for orders placed or performed during the hospital encounter of 01/23/18 (from the past 24 hour(s))  POCT urinalysis dip (device)      Status: None   Collection Time: 01/23/18  4:18 PM  Result Value Ref Range   Glucose, UA NEGATIVE NEGATIVE mg/dL   Bilirubin Urine NEGATIVE NEGATIVE   Ketones, ur NEGATIVE NEGATIVE mg/dL   Specific Gravity, Urine >=1.030 1.005 - 1.030   Hgb urine dipstick NEGATIVE NEGATIVE   pH 6.0 5.0 - 8.0   Protein, ur NEGATIVE NEGATIVE mg/dL   Urobilinogen, UA 1.0 0.0 - 1.0 mg/dL   Nitrite NEGATIVE NEGATIVE   Leukocytes, UA NEGATIVE NEGATIVE  Pregnancy, urine POC     Status: None   Collection Time: 01/23/18  4:20 PM  Result Value Ref Range   Preg Test, Ur NEGATIVE NEGATIVE   ASSESSMENT & PLAN:  1. Nonspecific abdominal  pain     No orders of the defined types were placed in this encounter.  Urine did not show signs of infection Patient would like to go to the ED for further evaluation and management Patient discharged in stable condition.    Reviewed expectations re: course of current medical issues. Questions answered. Outlined signs and symptoms indicating need for more acute intervention. Patient verbalized understanding. After Visit Summary given.   Lestine Box, PA-C 01/23/18 1635

## 2018-01-23 NOTE — ED Triage Notes (Signed)
Pt complains of contraction like pain in her abdomen x1 week.

## 2018-01-23 NOTE — ED Triage Notes (Signed)
Patient is complaining of right lower abd pain for the last 4 days ago. Associated symptoms of nausea. Denies fever. Patient reports she was seen at [facility] and encouraged to follow up with ED for an CT Abd scan.

## 2018-01-23 NOTE — Discharge Instructions (Signed)
Urine did not show signs of infection Patient would like to go to the ED for further evaluation and management Patient discharged in stable condition.

## 2018-01-24 ENCOUNTER — Emergency Department (HOSPITAL_COMMUNITY): Payer: BLUE CROSS/BLUE SHIELD

## 2018-01-24 ENCOUNTER — Other Ambulatory Visit (HOSPITAL_COMMUNITY): Payer: BLUE CROSS/BLUE SHIELD

## 2018-01-24 DIAGNOSIS — R109 Unspecified abdominal pain: Secondary | ICD-10-CM | POA: Diagnosis not present

## 2018-01-24 MED ORDER — IOPAMIDOL (ISOVUE-300) INJECTION 61%
30.0000 mL | Freq: Once | INTRAVENOUS | Status: AC | PRN
  Administered 2018-01-24: 30 mL via ORAL

## 2018-01-24 MED ORDER — IOPAMIDOL (ISOVUE-300) INJECTION 61%
INTRAVENOUS | Status: AC
  Filled 2018-01-24: qty 100

## 2018-01-24 MED ORDER — IOPAMIDOL (ISOVUE-300) INJECTION 61%
100.0000 mL | Freq: Once | INTRAVENOUS | Status: AC | PRN
  Administered 2018-01-24: 100 mL via INTRAVENOUS

## 2018-01-24 NOTE — Discharge Instructions (Addendum)
Your CAT scan and lab work was very reassuring.  You do have a cyst in your left ovary and fibroids on your uterus.  Follow-up with your primary care doctor and GYN doctor.  Return the ED with any worsening symptoms.

## 2018-01-24 NOTE — ED Provider Notes (Signed)
[facility] DEPT Provider Note   CSN: [id number] Arrival date & time: 01/23/18  1706     History   Chief Complaint Chief Complaint  Patient presents with  . Abdominal Pain    HPI Christy Collins is a 47 y.o. female.  HPI 47 year old African-American female past medical history significant for GERD, esophageal stricture, achalasia, hiatal hernia, ovarian cyst presents to the emergency department today for evaluation of right lower quadrant abdominal pain.  Patient states the pain is been ongoing for 5 days.  It is been intermittent.  States that last night the pain was worse which is why she presented to the ED today.  Patient describes the pain is cramping and contracture-like in nature.  Was seen by urgent care and sent to the ED for further evaluation with CT scan.  Patient has tried Maalox for her symptoms with little relief.  Pain is worse with eating.  Last bowel movement was this morning and normal without any melena or hematochezia.  Reports associated nausea but denies any emesis.  Nothing makes better.  Pt denies any fever, chill, ha, vision changes, lightheadedness, dizziness, congestion, neck pain, cp, sob, cough,  v/d, urinary symptoms, change in bowel habits, melena, hematochezia, lower extremity paresthesias.  Past Medical History:  Diagnosis Date  . Achalasia   . Anemia   . Bacterial infection   . Constipation   . Eczema   . GERD (gastroesophageal reflux disease)    Chronic   . Hiatal hernia   . History of chicken pox   . Ovarian cyst   . PONV (postoperative nausea and vomiting)   . Stricture and stenosis of esophagus   . Vitiligo   . Yeast infection     Patient Active Problem List   Diagnosis Date Noted  . Vitiligo   . ANXIETY 04/29/2008  . ACHALASIA s/p laparoscopic Heller myotomy and Dor Fundoplication 44/09/4740  . ESOPHAGEAL STRICTURE 04/24/2008  . GERD 04/24/2008  . CONSTIPATION 04/24/2008    Past Surgical History:   Procedure Laterality Date  . BREAST LUMPECTOMY    . DILATION AND CURETTAGE OF UTERUS    . DILITATION & CURRETTAGE/HYSTROSCOPY WITH VERSAPOINT RESECTION  06/08/2012   Procedure: DILATATION & CURETTAGE/HYSTEROSCOPY WITH VERSAPOINT RESECTION;  Surgeon: Alwyn Pea, MD;  Location: [facility];  Service: Gynecology;  Laterality: N/A;  and novasure  . ESOPHAGEAL MANOMETRY N/A 09/17/2012   Procedure: ESOPHAGEAL MANOMETRY (EM);  Surgeon: Sable Feil, MD;  Location: [facility];  Service: Endoscopy;  Laterality: N/A;  . ESOPHAGOGASTRODUODENOSCOPY N/A 11/05/2012   Procedure: ESOPHAGOGASTRODUODENOSCOPY (EGD);  Surgeon: Odis Hollingshead, MD;  Location: [facility];  Service: General;  Laterality: N/A;  . HELLER MYOTOMY N/A 11/05/2012   Procedure: LAPAROSCOPIC ESOPHAGEAL MYOTOMY AND ANTERIOR FUNDOPLICATION;  Surgeon: Odis Hollingshead, MD;  Location: [facility];  Service: General;  Laterality: N/A;  . TONSILLECTOMY    . TUBAL LIGATION    . UTERINE FIBROID SURGERY       OB History    Gravida  4   Para  3   Term      Preterm      AB  1   Living  3     SAB      TAB      Ectopic      Multiple      Live Births  3            Home Medications    Prior to Admission medications  Medication Sig Start Date End Date Taking? Authorizing Provider  Black Cohosh 40 MG CAPS Take 80 mg by mouth daily.   Yes [provider]  Multiple Vitamins-Minerals (MULTIPLE VITAMINS/WOMENS) tablet Take 1 tablet by mouth daily.   Yes [provider]  amoxicillin-clavulanate (AUGMENTIN) 875-125 MG tablet Take 1 tablet by mouth 2 (two) times daily. Patient not taking: Reported on 01/23/2018 08/04/17   Susy Frizzle, MD    Family History Family History  Problem Relation Age of Onset  . Asthma Mother   . Hyperlipidemia Mother   . Alcohol abuse Father   . Hypertension Father   . Asthma Daughter   . Cancer Maternal Grandmother   . Cancer Paternal Uncle   . Hypertension Paternal Uncle    . Hypertension Paternal Aunt   . Diabetes Paternal Uncle   . Diabetes Paternal Aunt   . Rheumatic fever Daughter     Social History Social History   Tobacco Use  . Smoking status: Former Smoker    Packs/day: 0.50    Types: Cigarettes    Last attempt to quit: 11/05/2012    Years since quitting: 5.2  . Smokeless tobacco: Never Used  Substance Use Topics  . Alcohol use: No  . Drug use: No     Allergies   Other   Review of Systems Review of Systems  All other systems reviewed and are negative.    Physical Exam Updated Vital Signs BP 132/83 (BP Location: Right Arm)   Pulse 66   Temp 98.3 F (36.8 C) (Oral)   Resp 16   Ht 5\' 4"  (1.626 m)   Wt 54.4 kg (120 lb)   SpO2 100%   BMI 20.60 kg/m   Physical Exam  Constitutional: She is oriented to person, place, and time. She appears well-developed and well-nourished.  Non-toxic appearance. No distress.  HENT:  Head: Normocephalic and atraumatic.  Nose: Nose normal.  Mouth/Throat: Oropharynx is clear and moist.  Eyes: Pupils are equal, round, and reactive to light. Conjunctivae are normal. Right eye exhibits no discharge. Left eye exhibits no discharge.  Neck: Normal range of motion. Neck supple.  Cardiovascular: Normal rate, regular rhythm, normal heart sounds and intact distal pulses.  Pulmonary/Chest: Effort normal and breath sounds normal. No respiratory distress. She exhibits no tenderness.  Abdominal: Soft. Bowel sounds are normal. There is tenderness (Minimal to deep palpation.) in the right lower quadrant. There is no rebound and no guarding.  Musculoskeletal: Normal range of motion. She exhibits no tenderness.  Lymphadenopathy:    She has no cervical adenopathy.  Neurological: She is alert and oriented to person, place, and time.  Skin: Skin is warm and dry. Capillary refill takes less than 2 seconds.  Psychiatric: Her behavior is normal. Judgment and thought content normal.  Nursing note and vitals  reviewed.    ED Treatments / Results  Labs (all labs ordered are listed, but only abnormal results are displayed) Labs Reviewed  COMPREHENSIVE METABOLIC PANEL - Abnormal; Notable for the following components:      Result Value   Glucose, Bld 128 (*)    Calcium 8.7 (*)    Alkaline Phosphatase 21 (*)    Total Bilirubin 0.2 (*)    All other components within normal limits  URINALYSIS, ROUTINE W REFLEX MICROSCOPIC - Abnormal; Notable for the following components:   APPearance HAZY (*)    All other components within normal limits  LIPASE, BLOOD  CBC  I-STAT BETA HCG BLOOD, ED ([facility], [facility],  [facility] ONLY)    EKG None  Radiology Ct Abdomen Pelvis W Contrast  Result Date: 01/24/2018 CLINICAL DATA:  47 year old female with acute abdominal pain. EXAM: CT ABDOMEN AND PELVIS WITH CONTRAST TECHNIQUE: Multidetector CT imaging of the abdomen and pelvis was performed using the standard protocol following bolus administration of intravenous contrast. CONTRAST:  <See Chart> ISOVUE-300 IOPAMIDOL (ISOVUE-300) INJECTION 61%, <See Chart> ISOVUE-300 IOPAMIDOL (ISOVUE-300) INJECTION 61% COMPARISON:  None. FINDINGS: Lower chest: The visualized lung bases are clear. No intra-abdominal free air or free fluid. Hepatobiliary: No focal liver abnormality is seen. No gallstones, gallbladder wall thickening, or biliary dilatation. Pancreas: Unremarkable. No pancreatic ductal dilatation or surrounding inflammatory changes. Spleen: Normal in size without focal abnormality. Adrenals/Urinary Tract: Adrenal glands are unremarkable. Kidneys are normal, without renal calculi, focal lesion, or hydronephrosis. Bladder is unremarkable. Stomach/Bowel: Multiple surgical clips noted at the gastroesophageal junction. Evaluation of the postsurgical changes and GE junction is limited on this CT. However there is no fluid surrounding the GE junction. There is no bowel obstruction or active inflammation. Normal appendix. Vascular/Lymphatic: No  significant vascular findings are present. No enlarged abdominal or pelvic lymph nodes. Reproductive: Enlarged myomatous uterus the right ovary is grossly unremarkable. A 2.5 cm septated cyst or adjacent cyst in the left ovary. Other: Small fat containing supraumbilical hernia. Musculoskeletal: No acute or significant osseous findings. IMPRESSION: 1. No bowel obstruction or active inflammation.  Normal appendix. 2. Enlarged myomatous uterus.  Left ovarian cysts. Electronically Signed   By: Anner Crete M.D.   On: 01/24/2018 03:37    Procedures Procedures (including critical care time)  Medications Ordered in ED Medications  iopamidol (ISOVUE-300) 61 % injection (has no administration in time range)  iopamidol (ISOVUE-300) 61 % injection 100 mL (100 mLs Intravenous Contrast Given 01/24/18 0300)  iopamidol (ISOVUE-300) 61 % injection 30 mL (30 mLs Oral Contrast Given 01/24/18 0300)     Initial Impression / Assessment and Plan / ED Course  I have reviewed the triage vital signs and the nursing notes.  Pertinent labs & imaging results that were available during my care of the patient were reviewed by me and considered in my medical decision making (see chart for details).     Patient is nontoxic, nonseptic appearing, in no apparent distress.  Patient's pain and other symptoms adequately managed in emergency department.  Fluid bolus given.  Labs, imaging and vitals reviewed.  No leukocytosis.  Normal liver enzymes and lipase.  Pregnancy test negative.  UA shows no signs of infection.  CT scan of abdomen reveals uterine myomas and left ovarian cyst but no other acute abnormalities.  Patient refused pain medication.  Patient does not meet the SIRS or Sepsis criteria.  On repeat exam patient does not have a surgical abdomin and there are no peritoneal signs.  No indication of appendicitis, bowel obstruction, bowel perforation, cholecystitis, diverticulitis, PID, ectopic pregnancy, ovarian torsion.   Patient discharged home with symptomatic treatment and given strict instructions for follow-up with their primary care physician.   Pt is hemodynamically stable, in NAD, & able to ambulate in the ED. Evaluation does not show pathology that would require ongoing emergent intervention or inpatient treatment. I explained the diagnosis to the patient. Pain has been managed & has no complaints prior to dc. Pt is comfortable with above plan and is stable for discharge at this time. All questions were answered prior to disposition. Strict return precautions for f/u to the ED were discussed. Encouraged follow up with PCP.  Final Clinical Impressions(s) / ED Diagnoses   Final diagnoses:  Right lower quadrant abdominal pain    ED Discharge Orders    None       Aaron Edelman 01/24/18 0741    Fatima Blank, MD 01/24/18 [id number]

## 2018-01-29 ENCOUNTER — Encounter: Payer: Self-pay | Admitting: Family Medicine

## 2018-02-09 ENCOUNTER — Encounter: Payer: Self-pay | Admitting: Family Medicine

## 2018-02-09 ENCOUNTER — Ambulatory Visit (INDEPENDENT_AMBULATORY_CARE_PROVIDER_SITE_OTHER): Payer: BLUE CROSS/BLUE SHIELD | Admitting: Family Medicine

## 2018-02-09 VITALS — BP 110/66 | HR 68 | Temp 98.0°F | Resp 18 | Ht 63.5 in | Wt 123.0 lb

## 2018-02-09 DIAGNOSIS — K5900 Constipation, unspecified: Secondary | ICD-10-CM | POA: Diagnosis not present

## 2018-02-09 NOTE — Progress Notes (Signed)
Subjective:    Patient ID: Christy Collins, female    DOB: 1971/03/31, 47 y.o.   MRN: [id number]  HPI Patient is a very pleasant 47 year old African-American female who presents today with "GI issues".  She states that she went to [place] earlier this month for vacation and stayed for over a week.  She has a history of intermittent constipation.  She states that when she travels, she is unable to defecate in unusual/strange places.  Therefore she went more than a week without having a bowel movement.  When she came back, she had a difficult time going to the restroom.  She took one Dulcolax and did not still have a bowel movement.  Ultimately she developed abdominal fullness, abdominal pain and discomfort.  She went to the emergency room on July 17.  CT scan revealed an ovarian cyst but was otherwise unremarkable.  I have copied the results below: 1. No bowel obstruction or active inflammation.  Normal appendix. 2. Enlarged myomatous uterus.  Left ovarian cysts.  CBC was unremarkable.  CMP was unremarkable.  Urine pregnancy test was negative.  Since discharge from the emergency room, her abdominal pain has resolved.  However she now states that she has a pressure-like sensation constantly in her lower abdomen.  Even after having a bowel movement, she feels like there is still pressure in her rectum as if she needs to defecate more.  Occasionally she is experiencing fecal leakage or having had a bowel movement.  She constantly feels full and bloated.  She is still going days in between bowel movements.  She believes she is constipated Past Medical History:  Diagnosis Date  . Achalasia   . Anemia   . Bacterial infection   . Constipation   . Eczema   . GERD (gastroesophageal reflux disease)    Chronic   . Hiatal hernia   . History of chicken pox   . Ovarian cyst   . PONV (postoperative nausea and vomiting)   . Stricture and stenosis of esophagus   . Vitiligo   . Yeast infection    Past  Surgical History:  Procedure Laterality Date  . BREAST LUMPECTOMY    . DILATION AND CURETTAGE OF UTERUS    . DILITATION & CURRETTAGE/HYSTROSCOPY WITH VERSAPOINT RESECTION  06/08/2012   Procedure: DILATATION & CURETTAGE/HYSTEROSCOPY WITH VERSAPOINT RESECTION;  Surgeon: Alwyn Pea, MD;  Location: [facility];  Service: Gynecology;  Laterality: N/A;  and novasure  . ESOPHAGEAL MANOMETRY N/A 09/17/2012   Procedure: ESOPHAGEAL MANOMETRY (EM);  Surgeon: Sable Feil, MD;  Location: [facility];  Service: Endoscopy;  Laterality: N/A;  . ESOPHAGOGASTRODUODENOSCOPY N/A 11/05/2012   Procedure: ESOPHAGOGASTRODUODENOSCOPY (EGD);  Surgeon: Odis Hollingshead, MD;  Location: [facility];  Service: General;  Laterality: N/A;  . HELLER MYOTOMY N/A 11/05/2012   Procedure: LAPAROSCOPIC ESOPHAGEAL MYOTOMY AND ANTERIOR FUNDOPLICATION;  Surgeon: Odis Hollingshead, MD;  Location: [facility];  Service: General;  Laterality: N/A;  . TONSILLECTOMY    . TUBAL LIGATION    . UTERINE FIBROID SURGERY     Current Outpatient Medications on File Prior to Visit  Medication Sig Dispense Refill  . Black Cohosh 40 MG CAPS Take 80 mg by mouth daily.    . Multiple Vitamins-Minerals (MULTIPLE VITAMINS/WOMENS) tablet Take 1 tablet by mouth daily.     No current facility-administered medications on file prior to visit.    Allergies  Allergen Reactions  . Other     Patient states that she cant  take any pain meds and if she is put to sleep it makes her sick.   Social History   Socioeconomic History  . Marital status: Married    Spouse name: Not on file  . Number of children: 3  . Years of education: Not on file  . Highest education level: Not on file  Occupational History  . Occupation: CAP CASE MANAGER    Employer: [employer]  Social Needs  . Financial resource strain: Not on file  . Food insecurity:    Worry: Not on file    Inability: Not on file  . Transportation needs:    Medical: Not on file    Non-medical:  Not on file  Tobacco Use  . Smoking status: Former Smoker    Packs/day: 0.50    Types: Cigarettes    Last attempt to quit: 11/05/2012    Years since quitting: 5.2  . Smokeless tobacco: Never Used  Substance and Sexual Activity  . Alcohol use: No  . Drug use: No  . Sexual activity: Yes    Birth control/protection: Surgical  Lifestyle  . Physical activity:    Days per week: Not on file    Minutes per session: Not on file  . Stress: Not on file  Relationships  . Social connections:    Talks on phone: Not on file    Gets together: Not on file    Attends religious service: Not on file    Active member of club or organization: Not on file    Attends meetings of clubs or organizations: Not on file    Relationship status: Not on file  . Intimate partner violence:    Fear of current or ex partner: Not on file    Emotionally abused: Not on file    Physically abused: Not on file    Forced sexual activity: Not on file  Other Topics Concern  . Not on file  Social History Narrative  . Not on file   Family History  Problem Relation Age of Onset  . Asthma Mother   . Hyperlipidemia Mother   . Alcohol abuse Father   . Hypertension Father   . Asthma Daughter   . Cancer Maternal Grandmother   . Cancer Paternal Uncle   . Hypertension Paternal Uncle   . Hypertension Paternal Aunt   . Diabetes Paternal Uncle   . Diabetes Paternal Aunt   . Rheumatic fever Daughter       Review of Systems  All other systems reviewed and are negative.      Objective:   Physical Exam  Constitutional: She is oriented to person, place, and time. She appears well-developed and well-nourished. No distress.  HENT:  Head: Normocephalic and atraumatic.  Right Ear: External ear normal.  Left Ear: External ear normal.  Nose: Nose normal.  Mouth/Throat: Oropharynx is clear and moist. No oropharyngeal exudate.  Eyes: Pupils are equal, round, and reactive to light. Conjunctivae and EOM are normal. Right  eye exhibits no discharge. Left eye exhibits no discharge. No scleral icterus.  Neck: Normal range of motion. Neck supple. No JVD present.  Cardiovascular: Normal rate, regular rhythm and normal heart sounds. Exam reveals no gallop and no friction rub.  No murmur heard. Pulmonary/Chest: Effort normal and breath sounds normal. No respiratory distress. She has no wheezes. She has no rales. She exhibits no tenderness.  Abdominal: Soft. Bowel sounds are normal. She exhibits no distension and no mass. There is no tenderness. There is  no rebound and no guarding.  Musculoskeletal: Normal range of motion. She exhibits no edema, tenderness or deformity.  Lymphadenopathy:    She has no cervical adenopathy.  Neurological: She is alert and oriented to person, place, and time. She has normal reflexes. No cranial nerve deficit. She exhibits normal muscle tone. Coordination normal.  Skin: Skin is warm. Rash noted. She is not diaphoretic. No pallor.  Psychiatric: She has a normal mood and affect. Her behavior is normal. Judgment and thought content normal.  Vitals reviewed.         Assessment & Plan:  Constipation, unspecified constipation type Her exam today is unremarkable.  Abdomen is soft nondistended nontender with decreased bowel sounds but no guarding or rebound.  I believe the patient is experiencing abdominal fullness and pressure secondary to constipation.  I recommended trying Linzess 145 mcg p.o. daily for 1 week in order to facilitate regular normal bowel movements.  I have also recommended that she had a fiber supplement as a bulking agent to help prevent fecal leakage and to facilitate normal regular bowel movements.  Reassess in 1 week if symptoms are no better and sooner if worse

## 2018-04-10 ENCOUNTER — Ambulatory Visit: Payer: BLUE CROSS/BLUE SHIELD | Admitting: Podiatry

## 2018-04-17 ENCOUNTER — Ambulatory Visit: Payer: BLUE CROSS/BLUE SHIELD | Admitting: Podiatry

## 2018-04-19 DIAGNOSIS — D259 Leiomyoma of uterus, unspecified: Secondary | ICD-10-CM | POA: Insufficient documentation

## 2018-04-19 DIAGNOSIS — L309 Dermatitis, unspecified: Secondary | ICD-10-CM | POA: Insufficient documentation

## 2018-04-19 DIAGNOSIS — K449 Diaphragmatic hernia without obstruction or gangrene: Secondary | ICD-10-CM | POA: Insufficient documentation

## 2018-04-21 ENCOUNTER — Encounter: Payer: Self-pay | Admitting: Podiatry

## 2018-04-21 ENCOUNTER — Ambulatory Visit (INDEPENDENT_AMBULATORY_CARE_PROVIDER_SITE_OTHER): Payer: BLUE CROSS/BLUE SHIELD | Admitting: Podiatry

## 2018-04-21 VITALS — BP 128/73 | HR 78

## 2018-04-21 DIAGNOSIS — L600 Ingrowing nail: Secondary | ICD-10-CM | POA: Diagnosis not present

## 2018-04-21 DIAGNOSIS — M79674 Pain in right toe(s): Secondary | ICD-10-CM

## 2018-04-21 MED ORDER — NEOMYCIN-POLYMYXIN-HC 3.5-10000-1 OT SOLN: OTIC | 0 refills | Status: DC

## 2018-04-21 NOTE — Patient Instructions (Signed)

## 2018-04-21 NOTE — Progress Notes (Signed)
Subjective:  Patient ID: Leafy Ro, female    DOB: 1970/12/06,  MRN: [id number]  Chief Complaint  Patient presents with  . Ingrown Toenail    right great toenail    47 y.o. female presents with the above complaint. Reports ingrown toenail to the right great toe. Present for years. Reports pain from the nail. Desires removal.  Review of Systems: Negative except as noted in the HPI. Denies N/V/F/Ch.  Past Medical History:  Diagnosis Date  . Achalasia   . Anemia   . Bacterial infection   . Constipation   . Eczema   . GERD (gastroesophageal reflux disease)    Chronic   . Hiatal hernia   . History of chicken pox   . Ovarian cyst   . PONV (postoperative nausea and vomiting)   . Stricture and stenosis of esophagus   . Vitiligo   . Yeast infection     Current Outpatient Medications:  .  Black Cohosh 40 MG CAPS, Take 80 mg by mouth daily., Disp: , Rfl:  .  Multiple Vitamins-Minerals (MULTIPLE VITAMINS/WOMENS) tablet, Take 1 tablet by mouth daily., Disp: , Rfl:  .  neomycin-polymyxin-hydrocortisone (CORTISPORIN) OTIC solution, Apply 2 drops to the ingrown toenail site twice daily. Cover with band-aid., Disp: 10 mL, Rfl: 0  Social History   Tobacco Use  Smoking Status Former Smoker  . Packs/day: 0.50  . Types: Cigarettes  . Last attempt to quit: 11/05/2012  . Years since quitting: 5.4  Smokeless Tobacco Never Used    Allergies  Allergen Reactions  . Other     Patient states that she cant take any pain meds and if she is put to sleep it makes her sick.   Objective:   Vitals:   04/21/18 0847  BP: 128/73  Pulse: 78   There is no height or weight on file to calculate BMI. Constitutional Well developed. Well nourished.  Vascular Dorsalis pedis pulses palpable bilaterally. Posterior tibial pulses palpable bilaterally. Capillary refill normal to all digits.  No cyanosis or clubbing noted. Pedal hair growth normal.  Neurologic Normal speech. Oriented to person,  place, and time. Epicritic sensation to light touch grossly present bilaterally.  Dermatologic Painful ingrowing nail at medial nail borders of the hallux nail right. No other open wounds. No skin lesions.  Orthopedic: Normal joint ROM without pain or crepitus bilaterally. No visible deformities. No bony tenderness.   Radiographs: None Assessment:   1. Ingrown toenail   2. Pain in toe of right foot    Plan:  Patient was evaluated and treated and all questions answered.  Ingrown Nail, right -Patient elects to proceed with minor surgery to remove ingrown toenail removal today. Consent reviewed and signed by patient. -Ingrown nail excised. See procedure note. -Educated on post-procedure care including soaking. Written instructions provided and reviewed. -Patient to follow up in 2 weeks for nail check. -Rx Cortisporin.  Procedure: Excision of Ingrown Toenail Location: Right 1st toe medial nail borders. Anesthesia: Lidocaine 1% plain; 1.5 mL and Marcaine 0.5% plain; 1.5 mL, digital block. Skin Prep: Betadine. Dressing: Silvadene; telfa; dry, sterile, compression dressing. Technique: Following skin prep, the toe was exsanguinated and a tourniquet was secured at the base of the toe. The affected nail border was freed, split with a nail splitter, and excised. Chemical matrixectomy was then performed with phenol and irrigated out with alcohol. The tourniquet was then removed and sterile dressing applied. Disposition: Patient tolerated procedure well. Patient to return in 2 weeks for follow-up.  Return in about 2 weeks (around 05/05/2018) for Nail Check.

## 2018-05-03 ENCOUNTER — Ambulatory Visit (INDEPENDENT_AMBULATORY_CARE_PROVIDER_SITE_OTHER): Payer: BLUE CROSS/BLUE SHIELD | Admitting: Podiatry

## 2018-05-03 DIAGNOSIS — L600 Ingrowing nail: Secondary | ICD-10-CM

## 2018-05-03 DIAGNOSIS — M79674 Pain in right toe(s): Secondary | ICD-10-CM

## 2018-05-10 NOTE — Progress Notes (Signed)
  Subjective:  Patient ID: Christy Collins, female    DOB: 07/01/1971,  MRN: [id number]  Chief Complaint  Patient presents with  . Nail Problem    Left 1st toenail check post ingrown removal. Pt states still sore and occasional blood drainage.   47 y.o. female returns for the above complaint.   Objective:   General AA&O x3. Normal mood and affect.  Vascular Foot warm and well perfused with good capillary refill.  Neurologic Sensation grossly intact.  Dermatologic Nail avulsion site healing well without drainage or erythema. Nail bed with overlying soft crust. Left intact. No signs of local infection.  Orthopedic: No tenderness to palpation of the toe.   Assessment & Plan:  Patient was evaluated and treated and all questions answered.  S/p Ingrown Toenail Excision, left -Healing well without issue. -Discussed return precautions. -F/u PRN

## 2018-05-30 DIAGNOSIS — M545 Low back pain: Secondary | ICD-10-CM | POA: Diagnosis not present

## 2018-05-30 DIAGNOSIS — M7712 Lateral epicondylitis, left elbow: Secondary | ICD-10-CM | POA: Diagnosis not present

## 2018-08-21 DIAGNOSIS — Z124 Encounter for screening for malignant neoplasm of cervix: Secondary | ICD-10-CM | POA: Diagnosis not present

## 2018-08-21 DIAGNOSIS — Z1231 Encounter for screening mammogram for malignant neoplasm of breast: Secondary | ICD-10-CM | POA: Diagnosis not present

## 2018-08-21 DIAGNOSIS — Z3040 Encounter for surveillance of contraceptives, unspecified: Secondary | ICD-10-CM | POA: Diagnosis not present

## 2018-08-21 DIAGNOSIS — R238 Other skin changes: Secondary | ICD-10-CM | POA: Diagnosis not present

## 2018-08-21 DIAGNOSIS — Z01419 Encounter for gynecological examination (general) (routine) without abnormal findings: Secondary | ICD-10-CM | POA: Diagnosis not present

## 2018-08-21 DIAGNOSIS — N920 Excessive and frequent menstruation with regular cycle: Secondary | ICD-10-CM | POA: Diagnosis not present

## 2018-08-21 DIAGNOSIS — R350 Frequency of micturition: Secondary | ICD-10-CM | POA: Diagnosis not present

## 2018-08-21 DIAGNOSIS — Z6820 Body mass index (BMI) 20.0-20.9, adult: Secondary | ICD-10-CM | POA: Diagnosis not present

## 2018-08-21 DIAGNOSIS — D259 Leiomyoma of uterus, unspecified: Secondary | ICD-10-CM | POA: Diagnosis not present

## 2018-09-05 DIAGNOSIS — N920 Excessive and frequent menstruation with regular cycle: Secondary | ICD-10-CM | POA: Diagnosis not present

## 2018-09-05 DIAGNOSIS — D259 Leiomyoma of uterus, unspecified: Secondary | ICD-10-CM | POA: Diagnosis not present

## 2018-11-07 ENCOUNTER — Ambulatory Visit (INDEPENDENT_AMBULATORY_CARE_PROVIDER_SITE_OTHER): Payer: BLUE CROSS/BLUE SHIELD | Admitting: Family Medicine

## 2018-11-07 ENCOUNTER — Encounter: Payer: Self-pay | Admitting: Family Medicine

## 2018-11-07 ENCOUNTER — Ambulatory Visit: Payer: BLUE CROSS/BLUE SHIELD | Admitting: Family Medicine

## 2018-11-07 ENCOUNTER — Other Ambulatory Visit: Payer: Self-pay

## 2018-11-07 VITALS — BP 154/97 | HR 63 | Resp 16 | Ht 63.5 in | Wt 120.0 lb

## 2018-11-07 DIAGNOSIS — J0141 Acute recurrent pansinusitis: Secondary | ICD-10-CM | POA: Diagnosis not present

## 2018-11-07 MED ORDER — AMOXICILLIN-POT CLAVULANATE 875-125 MG PO TABS: 1.0000 | ORAL_TABLET | Freq: Two times a day (BID) | ORAL | 0 refills | Status: DC

## 2018-11-07 MED ORDER — FLUCONAZOLE 150 MG PO TABS
150.0000 mg | ORAL_TABLET | ORAL | 0 refills | Status: DC | PRN
Start: 2018-11-07 — End: 2019-06-20

## 2018-11-07 NOTE — Progress Notes (Signed)
Patient ID: Christy Collins, female    DOB: May 06, 1971, 48 y.o.   MRN: [id number]  PCP: Susy Frizzle, MD  Virtual Visit via Telephone  Phone visit arranged with Christy Collins for 11/07/18 at 10:45 AM EDT  Services provided today were via telemedicine through telephone call. Start of phone call:  11:49 AM  I verified that I was speaking with the correct person using two identifiers. Patient reported their location during encounter was at home  Patient consented to telephone visit  I conducted telephone visit from [facility] [facility]  Referring Provider:   Susy Frizzle, MD   All participants in encounter:  Myself and the patient   I discussed the limitations, risks, security and privacy concerns of performing an evaluation and management service by telephone and the availability of in person appointments. I also discussed with the patient that there may be a patient responsible charge related to this service. The patient expressed understanding and agreed to proceed.  Chief Complaint  Patient presents with  . Sinusitis    headache, face pressure, throat drainage, started x2 days, taking sinus meds and ibuprofen    Subjective:   Christy Collins is a 48 y.o. female, has CC of sinusitis.   Sinusitis  This is a new problem. Episode onset: 2d. The problem has been gradually worsening since onset. There has been no fever. Her pain is at a severity of 6/10. The pain is moderate. Associated symptoms include congestion, headaches, sinus pressure and swollen glands. Pertinent negatives include no chills, coughing, diaphoresis, ear pain, hoarse voice, neck pain, shortness of breath, sneezing or sore throat. Treatments tried: tea, steam, advil cold and sinus, vitamins, zinc. The treatment provided mild relief.      Patient Active Problem List   Diagnosis Date Noted  . Eczema 04/19/2018  . Hiatal hernia 04/19/2018  . Uterine leiomyoma 04/19/2018  . Vitiligo    . ANXIETY 04/29/2008  . ACHALASIA s/p laparoscopic Heller myotomy and Dor Fundoplication 78/[id number]/8101  . ESOPHAGEAL STRICTURE 04/24/2008  . GERD 04/24/2008  . CONSTIPATION 04/24/2008    Prior to Admission medications   Medication Sig Start Date End Date Taking? Authorizing Provider  Black Cohosh 40 MG CAPS Take 80 mg by mouth daily.   Yes [provider]  Multiple Vitamins-Minerals (MULTIPLE VITAMINS/WOMENS) tablet Take 1 tablet by mouth daily.   Yes [provider]  neomycin-polymyxin-hydrocortisone (CORTISPORIN) OTIC solution Apply 2 drops to the ingrown toenail site twice daily. Cover with band-aid. 04/21/18  Yes Evelina Bucy, DPM    Allergies  Allergen Reactions  . Other     Patient states that she cant take any pain meds and if she is put to sleep it makes her sick.    Review of Systems  Constitutional: Negative.  Negative for chills and diaphoresis.  HENT: Positive for congestion and sinus pressure. Negative for ear pain, hoarse voice, sneezing and sore throat.   Eyes: Negative.   Respiratory: Negative.  Negative for cough and shortness of breath.   Cardiovascular: Negative.   Gastrointestinal: Negative.   Endocrine: Negative.   Genitourinary: Negative.   Musculoskeletal: Negative.  Negative for neck pain.  Skin: Negative.   Allergic/Immunologic: Negative.   Neurological: Positive for headaches.  Hematological: Negative.   Psychiatric/Behavioral: Negative.   All other systems reviewed and are negative.      Objective:    Vitals:   11/07/18 1155  BP: (!) 154/97  Pulse: 63  Resp:  16  Weight: 120 lb (54.4 kg)  Height: 5' 3.5" (1.613 m)      Physical Exam  Limited due to phone call encounter Pt reports ttp when pressing on face esp near eyes] She was speaking in full and complete sentences without audible wheeze or stridor, no coughing    Assessment & Plan:     ICD-10-CM   1. Acute recurrent pansinusitis J01.41  amoxicillin-clavulanate (AUGMENTIN) 875-125 MG tablet  moderate to severe sx, foul taste and smell, BP high, cannot take decongestants, will try and monitor or wait another few days before starting abx. When taking abx she requires multiple diflucan doses.  F/up if not improving.  F/up if BP continues to be elevated.   I discussed the assessment and treatment plan with the patient. The patient was provided an opportunity to ask questions and all were answered. The patient agreed with the plan and demonstrated an understanding of the instructions.   The patient was advised to call back or seek an in-person evaluation if the symptoms worsen or if the condition fails to improve as anticipated.  Phone call concluded at 12:03 PM  I provided 13 minutes of non-face-to-face time during this encounter.  Delsa Grana, PA-C 11/07/18 11:49 AM

## 2018-11-12 ENCOUNTER — Telehealth: Payer: Self-pay | Admitting: Family Medicine

## 2018-11-12 NOTE — Telephone Encounter (Signed)
Christy Collins  Had prescribed her medication last time she was seen, however this med is making her what feels to be contractions her stomach hurts so bad  [phone]

## 2018-11-13 MED ORDER — CEFDINIR 300 MG PO CAPS: 300.0000 mg | ORAL_CAPSULE | Freq: Two times a day (BID) | ORAL | 0 refills | Status: AC

## 2018-11-13 NOTE — Telephone Encounter (Signed)
Pt is referring to amoxicillin. Pt states that when she took it, it made her stomach hurt and when she stopped taking it, her stomach stopped hurting. Pt states that she is not feeling any better.

## 2018-11-13 NOTE — Telephone Encounter (Signed)
Pt notified Verbalizes understanding 

## 2018-11-13 NOTE — Telephone Encounter (Signed)
See note

## 2018-11-13 NOTE — Telephone Encounter (Signed)
Only prescribed antibiotics for sinus infection to take if her sx worsened, and diflucan if she developed yeast infection sx. Please see what meds she is referring to.

## 2019-01-22 DIAGNOSIS — M7711 Lateral epicondylitis, right elbow: Secondary | ICD-10-CM | POA: Diagnosis not present

## 2019-02-26 ENCOUNTER — Other Ambulatory Visit: Payer: Self-pay

## 2019-02-26 DIAGNOSIS — Z20822 Contact with and (suspected) exposure to covid-19: Secondary | ICD-10-CM

## 2019-02-27 LAB — NOVEL CORONAVIRUS, NAA: SARS-CoV-2, NAA: NOT DETECTED

## 2019-03-05 DIAGNOSIS — M7711 Lateral epicondylitis, right elbow: Secondary | ICD-10-CM | POA: Diagnosis not present

## 2019-03-19 ENCOUNTER — Other Ambulatory Visit: Payer: Self-pay | Admitting: Obstetrics and Gynecology

## 2019-03-20 ENCOUNTER — Other Ambulatory Visit: Payer: Self-pay

## 2019-03-20 DIAGNOSIS — R6889 Other general symptoms and signs: Secondary | ICD-10-CM | POA: Diagnosis not present

## 2019-03-20 DIAGNOSIS — Z20822 Contact with and (suspected) exposure to covid-19: Secondary | ICD-10-CM

## 2019-03-22 LAB — NOVEL CORONAVIRUS, NAA: SARS-CoV-2, NAA: NOT DETECTED

## 2019-04-09 ENCOUNTER — Other Ambulatory Visit: Payer: Self-pay

## 2019-04-09 DIAGNOSIS — Z20822 Contact with and (suspected) exposure to covid-19: Secondary | ICD-10-CM

## 2019-04-10 LAB — NOVEL CORONAVIRUS, NAA: SARS-CoV-2, NAA: NOT DETECTED

## 2019-05-07 ENCOUNTER — Other Ambulatory Visit: Payer: Self-pay

## 2019-05-07 DIAGNOSIS — D259 Leiomyoma of uterus, unspecified: Secondary | ICD-10-CM | POA: Diagnosis not present

## 2019-05-07 DIAGNOSIS — Z01818 Encounter for other preprocedural examination: Secondary | ICD-10-CM | POA: Diagnosis not present

## 2019-05-07 NOTE — H&P (Addendum)
Christy Collins is a 48 y.o.  female, P: 3-0-1-3 presents for a hysterectomy because of symptomatic uterine fibroids. For many years the patient has had fibroids for which she has had a myomectomy and endometrial ablation for management of associated symptoms.  Over the past year the patient has felt as if her abdomen was swollen, positional dyspareunia, urinary frequency and positional difficulty with defecation.  She denies any irregular vaginal bleeding, post coital bleeding or vaginitis symptoms.  In spite of an endometrial ablation in 2013, the patient has monthly periods that last 3 days with the need to change her pad 4 times a day.  A pelvic ultrasound in February 2020 revealed an anteverted uterus (fundus to external os measurement = 9.15 cm) 6.25 x 5.09 x 6.89 cm, endometrium: 0.46 cm but obscured by fibroids;  their were numerous fibroids, largest #3 measured: right subserosal-4.8 cm, mid-anterior submucosal-2.6 cm and left sub-serosal-4.5 cm; right ovary-4.14 cm and left ovary- 2.94 cm.  An endometrial biopsy performed at pre-op returned        small fragments benign endometrium.   A review of both medical and surgical management options were given to the patient however,  she desires definitive therapy for the persistent mass effect symptoms of her fibroids in the form of hysterectomy.   Past Medical History  OB History: G: 4;  P: 3-0-1-3:  SVB: 1991, 1993 and 2001  (largest infant: 7 lbs. 10 oz.)  GYN History: menarche: 48 YO;   LMP: 03/21/2019    Contraception: Tubal Sterilization;  Admits to a history of abnormal PAP smear but it was repeated and returned normal.   Last PAP smear: 2020-normal with negative HPV  Medical History: Vitiligo, Esophageal Stricture, Hiatal Hernia, GERD and Eczema  Surgical History: 1990 Tonsillectomy; 2002 Tubal Sterilization; 2010 Right Breast Lumpectomy (benign); 2013 Endometrial Ablation and D & C; 2014 Heller Myotomy Denies  history of blood transfusions but  has problems with anesthesia (severe nausea and vomiting for days after procedure).  Gives a history of having, with her last procedure, no problems with anesthesia so is requesting the same if possible.  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 Other     Patient states that she cant take any pain meds and if she is put to sleep it makes her sick.   Denies sensitivity to peanuts, shellfish, soy, latex or adhesives.   Advises that all narcotic pain medicines causes her severe nausea and vomiting. In the past she has tolerated Tylenol  #3 with phenergan.  ROS: Admits to contact lenses but  denies headache, vision changes, nasal congestion, dysphagia, tinnitus, dizziness, hoarseness, cough,  chest pain, shortness of breath, nausea, vomiting, diarrhea,constipation,  urinary frequency, urgency  dysuria, hematuria, vaginitis symptoms, pelvic pain, swelling of joints,easy bruising,  myalgias, arthralgias, skin rashes, unexplained weight loss and except as is mentioned in the history of present illness, patient's review of systems is otherwise negative.   Physical Exam  Bp: 100/60  P: 62 bpm  R: 16  Temperature: 97.8 degrees F temporally  Weight: 118 lbs. Height: 5\' 4"    BMI: 20.3  Neck: supple without masses or thyromegaly Lungs: clear to auscultation Heart: regular rate and rhythm Abdomen: soft, non-tender and no organomegaly Pelvic:EGBUS- wnl; vagina-normal  rugae; uterus-irregular, 12-14 weeks size,  cervix without lesions or motion tenderness; adnexae-no tenderness or masses Extremities:  no clubbing, cyanosis or edema   Assesment: Symptomatic Uterine Fibroids   Disposition:  A discussion was held with patient  regarding the indication for her procedure(s) along with the risks, which include but are not limited to:  reaction to anesthesia, damage to adjacent organs, Infection, excessive bleeding, formation of scar tissue, early menopause, pelvic prolapse and the possible need for an open abdominal incision. She was further advised that she will experience transient post operative facial edema, that her hospital stay is expected to be 0-2 days, she should be able to return to her usual activities within 2-3 weeks (except intercourse to be delayed until 6 weeks) and that the robotic approach to her surgery requires more time to perform than an open abdominal approach. Patient was given the Miralax bowel prep to be completed the day before her  procedure.  Patient informed about FDA warning on use of morcellator dated 10/25/2012.  Discussed:  1. Incidence of post-operative diagnosis of sarcoma in women undergoing a hysterectomy is 2:1000 2. Annual incidence of leiomyosarcoma is 0.64/100,000 women 3. Sarcomas have the highest incidence in women over 65 4. Power morcellation involves risks of spreading tissue / disease. In he case of undiagnosed cancer, it may adversely affect the patient's prognosis.   She verbalized understanding of these risks and pre-operative instructions and has consented to proceed with a Robot Assisted Total Laparoscopic Hysterectomy with Bilateral Salpingectomy and Possible Morcellation at [facility] on May 23, 2019 @ 7: 30 a.m.   CSN# [id number]   Gregoria Selvy J. Florene Glen, PA-C  for Dr. Dede Query.[email or web address]

## 2019-05-11 ENCOUNTER — Other Ambulatory Visit: Payer: Self-pay

## 2019-05-11 DIAGNOSIS — Z20822 Contact with and (suspected) exposure to covid-19: Secondary | ICD-10-CM

## 2019-05-13 LAB — NOVEL CORONAVIRUS, NAA: SARS-CoV-2, NAA: NOT DETECTED

## 2019-05-20 ENCOUNTER — Other Ambulatory Visit (HOSPITAL_COMMUNITY)
Admission: RE | Admit: 2019-05-20 | Discharge: 2019-05-20 | Disposition: A | Discharge status: Home / Self Care | Payer: BC Managed Care – PPO | Source: Ambulatory Visit | Attending: Obstetrics and Gynecology | Admitting: Obstetrics and Gynecology

## 2019-05-20 DIAGNOSIS — Z01812 Encounter for preprocedural laboratory examination: Secondary | ICD-10-CM | POA: Diagnosis not present

## 2019-05-20 DIAGNOSIS — Z20828 Contact with and (suspected) exposure to other viral communicable diseases: Secondary | ICD-10-CM | POA: Insufficient documentation

## 2019-05-21 LAB — NOVEL CORONAVIRUS, NAA (HOSP ORDER, SEND-OUT TO REF LAB; TAT 18-24 HRS): SARS-CoV-2, NAA: NOT DETECTED

## 2019-05-21 NOTE — Patient Instructions (Addendum)
YOU HAD YOUR COVID TEST ON 05/20/2019 . PLEASE CONTINUE THE QUARANTINE  INSTRUCTIONS GIVEN IN YOUR HANDOUT.     Your procedure is scheduled on Thursday 05/23/2019   Report to [facility] AT  5:30 am   Call this number if you have problems the morning of surgery: [phone].   OUR ADDRESS IS [address].  WE ARE LOCATED IN [facility].                                     REMEMBER: NO SOLID FOOD AFTER MIDNIGHT THE NIGHT PRIOR TO SURGERY. NOTHING BY MOUTH EXCEPT CLEAR LIQUIDS UNTIL 4:30 AM, PLEASE FINISH ENSURE DRINK PER SURGEON ORDER  WHICH NEEDS TO BE COMPLETED AT 4:30 AM   CLEAR LIQUID DIET   Foods Allowed                                                                     Foods Excluded  Coffee and tea, regular and decaf                             liquids that you cannot  Plain Jell-O any favor except red or purple             see through such as: Fruit ices (not with fruit pulp)                                     milk, soups, orange juice  Iced Popsicles                                    All solid food Carbonated beverages, regular and diet                                    Cranberry, grape and apple juices Sports drinks like Gatorade Lightly seasoned clear broth or consume(fat free) Sugar, honey syrup  Sample Menu Breakfast                                Lunch                                     Supper Cranberry juice                    Beef broth                            Chicken broth Jell-O                                     Grape juice  Apple juice Coffee or tea                        Jell-O                                      Popsicle                                                Coffee or tea                        Coffee or tea  _____________________________________________________________________     TAKE THESE MEDICATIONS MORNING OF SURGERY WITH A SIP OF WATER:  NONE  YOU MAY  BRUSH  YOUR TEETH MORNING OF SURGERY AND RINSE YOUR MOUTH OUT, NO CHEWING GUM CANDY OR MINTS.   1 VISITOR IS ALLOWED IN WAITING ROOM ONLY DAY OF SURGERY.   NO VISITOR MAY SPEND THE NIGHT. VISITOR ARE ALLOWED TO STAY UNTIL 800 PM.                                    DO NOT WEAR JEWERLY, MAKE UP, OR NAIL POLISH ON FINGERNAILS.  DO NOT WEAR LOTIONS, POWDERS, PERFUMES OR DEODORANT.  DO NOT SHAVE FOR 24 HOURS PRIOR TO DAY OF SURGERY.   CONTACTS, GLASSES, OR DENTURES MAY NOT BE WORN TO SURGERY.                                    [facility] IS NOT RESPONSIBLE  FOR ANY BELONGINGS.                                                                    Marland Kitchen                                                                                                    [facility] - Preparing for Surgery Before surgery, you can play an important role.  Because skin is not sterile, your skin needs to be as free of germs as possible.  You can reduce the number of germs on your skin by washing with CHG (chlorahexidine gluconate) soap before surgery.  CHG is an antiseptic cleaner which kills germs and bonds with the skin to continue killing germs even after washing. Please DO NOT use if you have an allergy to CHG or antibacterial soaps.  If your skin becomes reddened/irritated stop using the CHG and  inform your nurse when you arrive at Short Stay. Do not shave (including legs and underarms) for at least 48 hours prior to the first CHG shower.  You may shave your face/neck. Please follow these instructions carefully:  1.  Shower with CHG Soap the night before surgery and the  morning of Surgery.  2.  If you choose to wash your hair, wash your hair first as usual with your  normal  shampoo.  3.  After you shampoo, rinse your hair and body thoroughly to remove the  shampoo.                             4.  Use CHG as you would any other liquid soap.  You can apply chg directly  to the skin and wash                       Gently with a  scrungie or clean washcloth.  5.  Apply the CHG Soap to your body ONLY FROM THE NECK DOWN.   Do not use on face/ open                           Wound or open sores. Avoid contact with eyes, ears mouth and genitals (private parts).                       Wash face,  Genitals (private parts) with your normal soap.             6.  Wash thoroughly, paying special attention to the area where your surgery  will be performed.  7.  Thoroughly rinse your body with warm water from the neck down.  8.  DO NOT shower/wash with your normal soap after using and rinsing off  the CHG Soap.                9.  Pat yourself dry with a clean towel.            10.  Wear clean pajamas.            11.  Place clean sheets on your bed the night of your first shower and do not  sleep with pets.  Day of Surgery : Do not apply any lotions/deodorants the morning of surgery.  Please wear clean clothes to the hospital/surgery center.  FAILURE TO FOLLOW THESE INSTRUCTIONS MAY RESULT IN THE CANCELLATION OF YOUR SURGERY PATIENT SIGNATURE_________________________________  NURSE SIGNATURE__________________________________  ________________________________________________________________________

## 2019-05-22 ENCOUNTER — Encounter (HOSPITAL_COMMUNITY)
Admission: RE | Admit: 2019-05-22 | Discharge: 2019-05-22 | Disposition: A | Discharge status: Home / Self Care | Payer: BC Managed Care – PPO | Source: Ambulatory Visit | Attending: Obstetrics and Gynecology | Admitting: Obstetrics and Gynecology

## 2019-05-22 ENCOUNTER — Other Ambulatory Visit: Payer: Self-pay

## 2019-05-22 ENCOUNTER — Encounter (HOSPITAL_COMMUNITY): Payer: Self-pay

## 2019-05-22 DIAGNOSIS — N920 Excessive and frequent menstruation with regular cycle: Secondary | ICD-10-CM | POA: Insufficient documentation

## 2019-05-22 DIAGNOSIS — Z01812 Encounter for preprocedural laboratory examination: Secondary | ICD-10-CM | POA: Diagnosis not present

## 2019-05-22 DIAGNOSIS — D259 Leiomyoma of uterus, unspecified: Secondary | ICD-10-CM | POA: Diagnosis not present

## 2019-05-22 LAB — BASIC METABOLIC PANEL
Anion gap: 7 (ref 5–15)
BUN: 11 mg/dL (ref 6–20)
CO2: 26 mmol/L (ref 22–32)
Calcium: 9 mg/dL (ref 8.9–10.3)
Chloride: 104 mmol/L (ref 98–111)
Creatinine, Ser: 0.82 mg/dL (ref 0.44–1.00)
GFR calc Af Amer: >60 mL/min (ref >60)
GFR calc non Af Amer: >60 mL/min (ref >60)
Glucose, Bld: 103 mg/dL — ABNORMAL HIGH (ref 70–99)
Potassium: 4.2 mmol/L (ref 3.5–5.1)
Sodium: 137 mmol/L (ref 135–145)

## 2019-05-22 LAB — CBC
HCT: 40.3 % (ref 36.0–46.0)
Hemoglobin: 12.9 g/dL (ref 12.0–15.0)
MCH: 28.5 pg (ref 26.0–34.0)
MCHC: 32 g/dL (ref 30.0–36.0)
MCV: 89 fL (ref 80.0–100.0)
Platelets: 280 10*3/uL (ref 150–400)
RBC: 4.53 MIL/uL (ref 3.87–5.11)
RDW: 11.6 % (ref 11.5–15.5)
WBC: 4.4 10*3/uL (ref 4.0–10.5)
nRBC: 0 % (ref 0.0–0.2)

## 2019-05-22 MED ORDER — ENSURE PRE-SURGERY PO LIQD
296.0000 mL | Freq: Once | ORAL | Status: DC
  Filled 2019-05-22: qty 296

## 2019-05-22 NOTE — Anesthesia Preprocedure Evaluation (Addendum)
Anesthesia Evaluation  Patient identified by MRN, date of birth, ID band Patient awake    Reviewed: Allergy & Precautions, NPO status , Patient's Chart, lab work & pertinent test results  History of Anesthesia Complications (+) PONV and history of anesthetic complications  Airway Mallampati: II  TM Distance: >3 FB Neck ROM: Full    Dental  (+) Teeth Intact   Pulmonary former smoker,    Pulmonary exam normal        Cardiovascular negative cardio ROS Normal cardiovascular exam     Neuro/Psych PSYCHIATRIC DISORDERS Anxiety negative neurological ROS     GI/Hepatic Neg liver ROS, hiatal hernia, GERD  ,achalasia   Endo/Other  negative endocrine ROS  Renal/GU negative Renal ROS     Musculoskeletal negative musculoskeletal ROS (+)   Abdominal   Peds  Hematology negative hematology ROS (+)   Anesthesia Other Findings   Reproductive/Obstetrics  Fibroids S/p tubal ligation                            Anesthesia Physical Anesthesia Plan  ASA: II  Anesthesia Plan: General   Post-op Pain Management:    Induction: Intravenous  PONV Risk Score and Plan: 4 or greater and Treatment may vary due to age or medical condition, Ondansetron, Midazolam, Dexamethasone and Scopolamine patch - Pre-op  Airway Management Planned: Oral ETT  Additional Equipment: None  Intra-op Plan:   Post-operative Plan: Extubation in OR  Informed Consent: I have reviewed the patients History and Physical, chart, labs and discussed the procedure including the risks, benefits and alternatives for the proposed anesthesia with the patient or authorized representative who has indicated his/her understanding and acceptance.     Dental advisory given  Plan Discussed with: CRNA and Anesthesiologist  Anesthesia Plan Comments: (Will plan for opioid avoidance as much as possible because of severe N/V with opioids.)        Anesthesia Quick Evaluation

## 2019-05-27 ENCOUNTER — Other Ambulatory Visit: Payer: Self-pay | Admitting: Obstetrics and Gynecology

## 2019-05-31 NOTE — H&P (Signed)
Christy Collins is a 48 y.o. female [id number]    Christy Collins is a 48 y.o. female, P: 3-0-1-3 presents for a hysterectomy because of symptomatic uterine fibroids. For many years the patient has had fibroids for which she has had a myomectomy and endometrial ablation for management of associated symptoms. Over the past year the patient has felt as if her abdomen was swollen, positional dyspareunia, urinary frequency and positional difficulty with defecation. She denies any irregular vaginal bleeding, post coital bleeding or vaginitis symptoms. In spite of an endometrial ablation in 2013, the patient has monthly periods that last 3 days with the need to change her pad 4 times a day. A pelvic ultrasound in February 2020 revealed an anteverted uterus (fundus to external os measurement = 9.15 cm) 6.25 x 5.09 x 6.89 cm, endometrium: 0.46 cm but obscured by fibroids; their were numerous fibroids, largest #3 measured: right subserosal-4.8 cm, mid-anterior submucosal-2.6 cm and left sub-serosal-4.5 cm; right ovary-4.14 cm and left ovary- 2.94 cm. An endometrial biopsy performed at pre-op returned small fragments benign endometrium. A review of both medical and surgical management options were given to the patient however, she desires definitive therapy for the persistent mass effect symptoms of her fibroids in the form of hysterectomy.   Past Medical History  OB History: G: 4; P: 3-0-1-3: SVB: 1991, 1993 and 2001 (largest infant: 7 lbs. 10 oz.)   GYN History: menarche: 48 YO; LMP: 03/21/2019 Contraception: Tubal Sterilization; Admits to a history of abnormal PAP smear but it was repeated and returned normal. Last PAP smear: 2020-normal with negative HPV   Medical History: Vitiligo, Esophageal Stricture, Hiatal Hernia, GERD and Eczema   Surgical History: 1990 Tonsillectomy; 2002 Tubal Sterilization; 2010 Right Breast Lumpectomy (benign); 2013 Endometrial Ablation and D & C; 2014 Heller Myotomy  Denies history of  blood transfusions but has problems with anesthesia (severe nausea and vomiting for days after procedure). Gives a history of having, with her last procedure, no problems with anesthesia so is requesting the same if possible.    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 Other     Patient states that she cant take any pain meds and if she is put to sleep it makes her sick.   Denies sensitivity to peanuts, shellfish, soy, latex or adhesives.   The patient advises that all narcotic pain medicines causes her severe nausea and vomiting. In the past she has tolerated Tylenol #3 with phenergan.    ROS: Admits to contact lenses but denies headache, vision changes, nasal congestion, dysphagia, tinnitus, dizziness, hoarseness, cough, chest pain, shortness of breath, nausea, vomiting, diarrhea,constipation, urinary frequency, urgency dysuria, hematuria, vaginitis symptoms, pelvic pain, swelling of joints,easy bruising, myalgias, arthralgias, skin rashes, unexplained weight loss and except as is mentioned in the history of present illness, patient's review of systems is otherwise negative.   Physical Exam  Bp: 100/60 P: 62 bpm R: 16 Temperature: 97.8 degrees F temporally Weight: 118 lbs. Height: 5\' 4"  BMI: 20.3  Neck: supple without masses or thyromegaly  Lungs: clear to auscultation  Heart: regular rate and rhythm  Abdomen: soft, non-tender and no organomegaly  Pelvic:EGBUS- wnl; vagina-normal rugae; uterus-irregular, 12-14 weeks size, cervix without lesions or motion tenderness;  adnexae-no tenderness or masses  Extremities: no clubbing, cyanosis or edema    Assesment: Symptomatic Uterine Fibroids    Disposition: A  discussion was held with patient regarding the indication for her procedure(s) along with the risks, which include but are not limited to: reaction to anesthesia, damage to adjacent organs, Infection, excessive bleeding, formation of scar tissue, early menopause, pelvic prolapse and the possible need for an open abdominal incision. She was further advised that she will experience transient post operative facial edema, that her hospital stay is expected to be 0-2 days, she should be able to return to her usual activities within 2-3 weeks (except intercourse to be delayed until 6 weeks) and that the robotic approach to her surgery requires more time to perform than an open abdominal approach. Patient was given the Miralax bowel prep to be completed the day before her procedure.  Patient informed about FDA warning on use of morcellator dated 10/25/2012.  Discussed:  1. Incidence of post-operative diagnosis of sarcoma in women undergoing a hysterectomy is 2:1000 2. Annual incidence of leiomyosarcoma is 0.64/100,000 women 3. Sarcomas have the highest incidence in women over 65 4. Power morcellation involves risks of spreading tissue / disease. In he case of undiagnosed cancer, it may adversely affect the patient's prognosis.   The patient verbalized understanding of these risks and pre-operative instructions and has consented to proceed with a Robot Assisted Total Laparoscopic Hysterectomy with Bilateral Salpingectomy and Possible Morcellation at [facility] on May 23, 2019 @ 7: 30 a.m.  Christy Collins is a 48 y.o. female, P: 3-0-1-3 presents for a hysterectomy because of symptomatic uterine fibroids. For many years the patient has had fibroids for which she has had a myomectomy and endometrial ablation for management of associated symptoms. Over the past year the patient has felt as if her abdomen was swollen, positional dyspareunia, urinary frequency and positional difficulty with  defecation. She denies any irregular vaginal bleeding, post coital bleeding or vaginitis symptoms. In spite of an endometrial ablation in 2013, the patient has monthly periods that last 3 days with the need to change her pad 4 times a day. A pelvic ultrasound in February 2020 revealed an anteverted uterus (fundus to external os measurement = 9.15 cm) 6.25 x 5.09 x 6.89 cm, endometrium: 0.46 cm but obscured by fibroids; their were numerous fibroids, largest #3 measured: right subserosal-4.8 cm, mid-anterior submucosal-2.6 cm and left sub-serosal-4.5 cm; right ovary-4.14 cm and left ovary- 2.94 cm. An endometrial biopsy performed at pre-op returned small fragments benign endometrium. A review of both medical and surgical management options were given to the patient however, she desires definitive therapy for the persistent mass effect symptoms of her fibroids in the form of hysterectomy.  Past Medical History  OB History: G: 4; P: 3-0-1-3: SVB: 1991, 1993 and 2001 (largest infant: 7 lbs. 10 oz.)  GYN History: menarche: 48 YO; LMP: 03/21/2019 Contraception: Tubal Sterilization; Admits to a history of abnormal PAP smear but it was repeated and returned normal. Last PAP smear: 2020-normal with negative HPV  Medical History: Vitiligo, Esophageal Stricture, Hiatal Hernia, GERD and Eczema  Surgical History: 1990 Tonsillectomy; 2002 Tubal Sterilization; 2010 Right Breast Lumpectomy (benign); 2013 Endometrial Ablation and D & C; 2014 Heller Myotomy  Denies history of blood transfusions but has problems with anesthesia (severe nausea and vomiting for days after procedure). Gives a history of having, with her last procedure, no problems with anesthesia so is requesting the same if possible.  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 Other     Patient states that she cant take any pain meds and if she is put to sleep it makes her sick.    Denies sensitivity to peanuts, shellfish, soy, latex or adhesives.    Advises that all narcotic pain medicines causes her severe nausea and vomiting. In the past she has tolerated Tylenol #3 with phenergan.   ROS: Admits to contact lenses but denies headache, vision changes, nasal congestion, dysphagia, tinnitus, dizziness, hoarseness, cough, chest pain, shortness of breath, nausea, vomiting, diarrhea,constipation, urinary frequency, urgency dysuria, hematuria, vaginitis symptoms, pelvic pain, swelling of joints,easy bruising, myalgias, arthralgias, skin rashes, unexplained weight loss and except as is mentioned in the history of present illness, patient's review of systems is otherwise negative.    Physical Exam :  Bp: 100/60 P: 62 bpm R: 16 Temperature: 97.8 degrees F temporally Weight: 118 lbs. Height: 5\' 4"  BMI: 20.3   Neck: supple without masses or thyromegaly  Lungs: clear to auscultation  Heart: regular rate and rhythm  Abdomen: soft, non-tender and no organomegaly  Pelvic:EGBUS- wnl; vagina-normal rugae; uterus-irregular, 16-18  weeks size, cervix without lesions or motion tenderness; adnexae-no tenderness or masses  Extremities: no clubbing, cyanosis or edema    Assesment: Symptomatic Uterine Fibroids    Disposition: A discussion was held with patient regarding the indication for her procedure(s) along with the risks, which include but are not limited to: reaction to anesthesia, damage to adjacent organs, Infection, excessive bleeding, formation of scar tissue, early menopause, pelvic prolapse and the possible need for an open abdominal incision. She was further advised that she will experience transient post operative facial edema, that her hospital stay is expected to be  0-2 days, she should be able to return to her usual activities within 2-3 weeks (except intercourse to be delayed until 6 weeks) and that the robotic approach to her surgery requires more time to perform than an open abdominal approach. Patient was given the Miralax bowel prep to be completed the day before her procedure.   Patient informed about FDA warning on use of morcellator dated 10/25/2012.   Discussed:  1. Incidence of post-operative diagnosis of sarcoma in women undergoing a hysterectomy is 2:1000 2. Annual incidence of leiomyosarcoma is 0.64/100,000 women 3. Sarcomas have the highest incidence in women over 65 4. Power morcellation involves risks of spreading tissue / disease. In he case of undiagnosed cancer, it may adversely affect the patient's prognosis.  She verbalized understanding of these risks and pre-operative instructions and has consented to proceed with a Robot Assisted Total Laparoscopic Hysterectomy with Bilateral Salpingectomy and Possible Morcellation at [facility] on June 20, 2019 @ 7: 30 a.m.    CSN# [id number]   Tykwon Fera J. Florene Glen, PA-C  for Dr.Sandra A. Rivard

## 2019-06-14 NOTE — Patient Instructions (Addendum)
YOU ARE SCHEDULED FOR A COVID TEST _________@____________ . THIS TEST MUST BE DONE BEFORE SURGERY. GO TO  [address], [place], [address] AND REMAIN IN YOUR CAR, THIS IS A DRIVE UP TEST. ONCE YOUR COVID TEST IS DONE PLEASE FOLLOW ALL THE QUARANTINE  INSTRUCTIONS GIVEN IN YOUR HANDOUT.      Your procedure is scheduled on    06-20-19  Report to [facility]. M.   Call this number if you have problems the morning of surgery  :[phone].   OUR ADDRESS IS [place].  WE ARE [facility]ATED [facility].                                     REMEMBER:  DO NOT EAT FOOD OR DRINK LIQUIDS AFTER MIDNIGHT .  YOU MAY  BRUSH YOUR TEETH MORNING OF SURGERY AND RINSE YOUR MOUTH OUT, NO CHEWING GUM CANDY OR MINTS.   TAKE THESE MEDICATIONS MORNING OF SURGERY WITH A SIP OF WATER:  ___NONE_______________________________  IF YOU ARE SPENDING THE NIGHT AFTER SURGERY PLEASE BRING ALL YOUR PRESCRIPTION MEDICATIONS IN THEIR ORIGINAL BOTTLES. 1 VISITOR IS ALLOWED IN WAITING ROOM ONLY DAY OF SURGERY. NO VISITOR MAY SPEND THE NIGHT. VISITOR ARE ALLOWED TO STAY UNTIL 800 PM.                                    DO NOT WEAR JEWERLY, MAKE UP, OR NAIL POLISH ON FINGERNAILS. DO NOT WEAR LOTIONS, POWDERS, PERFUMES OR DEODORANT. DO NOT SHAVE FOR 24 HOURS PRIOR TO DAY OF SURGERY.  CONTACTS, GLASSES, OR DENTURES MAY NOT BE WORN TO SURGERY.                                    [facility] IS NOT RESPONSIBLE  FOR ANY BELONGINGS.                                                                    Marland Kitchen   [facility] - Preparing for Surgery Before surgery, you can play an important role.  Because skin is not sterile, your skin needs to be as free of germs as possible.  You can reduce the number of germs on your skin by washing with CHG (chlorahexidine gluconate) soap before surgery.  CHG is an antiseptic cleaner which kills germs and bonds with the skin to continue  killing germs even after washing. Please DO NOT use if you have an allergy to CHG or antibacterial soaps.  If your skin becomes reddened/irritated stop using the CHG and inform your nurse when you arrive at Short Stay. Do not shave (including legs and underarms) for at least 48 hours prior to the first CHG shower.  You may shave your face/neck. Please follow these instructions carefully:  1.  Shower with CHG Soap the night before surgery and the  morning of Surgery.  2.  If you choose  to wash your hair, wash your hair first as usual with your  normal  shampoo.  3.  After you shampoo, rinse your hair and body thoroughly to remove the  shampoo.                           4.  Use CHG as you would any other liquid soap.  You can apply chg directly  to the skin and wash                       Gently with a scrungie or clean washcloth.  5.  Apply the CHG Soap to your body ONLY FROM THE NECK DOWN.   Do not use on face/ open                           Wound or open sores. Avoid contact with eyes, ears mouth and genitals (private parts).                       Wash face,  Genitals (private parts) with your normal soap.             6.  Wash thoroughly, paying special attention to the area where your surgery  will be performed.  7.  Thoroughly rinse your body with warm water from the neck down.  8.  DO NOT shower/wash with your normal soap after using and rinsing off  the CHG Soap.                9.  Pat yourself dry with a clean towel.            10.  Wear clean pajamas.            11.  Place clean sheets on your bed the night of your first shower and do not  sleep with pets. Day of Surgery : Do not apply any lotions/deodorants the morning of surgery.  Please wear clean clothes to the hospital/surgery center.  FAILURE TO FOLLOW THESE INSTRUCTIONS MAY RESULT IN THE CANCELLATION OF YOUR SURGERY PATIENT SIGNATURE_________________________________  NURSE  SIGNATURE__________________________________  ________________________________________________________________________

## 2019-06-17 ENCOUNTER — Other Ambulatory Visit (HOSPITAL_COMMUNITY)
Admission: RE | Admit: 2019-06-17 | Discharge: 2019-06-17 | Disposition: A | Discharge status: Home / Self Care | Payer: BC Managed Care – PPO | Source: Ambulatory Visit | Attending: Obstetrics and Gynecology | Admitting: Obstetrics and Gynecology

## 2019-06-17 ENCOUNTER — Other Ambulatory Visit: Payer: Self-pay

## 2019-06-17 ENCOUNTER — Encounter (HOSPITAL_COMMUNITY)
Admission: RE | Admit: 2019-06-17 | Discharge: 2019-06-17 | Disposition: A | Discharge status: Home / Self Care | Payer: BC Managed Care – PPO | Source: Ambulatory Visit | Attending: Obstetrics and Gynecology | Admitting: Obstetrics and Gynecology

## 2019-06-17 ENCOUNTER — Encounter (HOSPITAL_COMMUNITY): Payer: Self-pay

## 2019-06-17 DIAGNOSIS — Z20828 Contact with and (suspected) exposure to other viral communicable diseases: Secondary | ICD-10-CM | POA: Diagnosis not present

## 2019-06-17 DIAGNOSIS — D250 Submucous leiomyoma of uterus: Secondary | ICD-10-CM | POA: Diagnosis not present

## 2019-06-17 DIAGNOSIS — Z01812 Encounter for preprocedural laboratory examination: Secondary | ICD-10-CM | POA: Insufficient documentation

## 2019-06-17 DIAGNOSIS — Z885 Allergy status to narcotic agent status: Secondary | ICD-10-CM | POA: Diagnosis not present

## 2019-06-17 DIAGNOSIS — N888 Other specified noninflammatory disorders of cervix uteri: Secondary | ICD-10-CM | POA: Diagnosis not present

## 2019-06-17 DIAGNOSIS — N800 Endometriosis of uterus: Secondary | ICD-10-CM | POA: Diagnosis not present

## 2019-06-17 DIAGNOSIS — Z87891 Personal history of nicotine dependence: Secondary | ICD-10-CM | POA: Diagnosis not present

## 2019-06-17 DIAGNOSIS — N920 Excessive and frequent menstruation with regular cycle: Secondary | ICD-10-CM | POA: Diagnosis not present

## 2019-06-17 HISTORY — DX: Cardiac murmur, unspecified: R01.1

## 2019-06-17 LAB — CBC
HCT: 38.5 % (ref 36.0–46.0)
Hemoglobin: 12.6 g/dL (ref 12.0–15.0)
MCH: 29 pg (ref 26.0–34.0)
MCHC: 32.7 g/dL (ref 30.0–36.0)
MCV: 88.5 fL (ref 80.0–100.0)
Platelets: 269 10*3/uL (ref 150–400)
RBC: 4.35 MIL/uL (ref 3.87–5.11)
RDW: 11.3 % — ABNORMAL LOW (ref 11.5–15.5)
WBC: 5.6 10*3/uL (ref 4.0–10.5)
nRBC: 0 % (ref 0.0–0.2)

## 2019-06-17 LAB — BASIC METABOLIC PANEL
Anion gap: 10 (ref 5–15)
BUN: 12 mg/dL (ref 6–20)
CO2: 26 mmol/L (ref 22–32)
Calcium: 9 mg/dL (ref 8.9–10.3)
Chloride: 104 mmol/L (ref 98–111)
Creatinine, Ser: 0.88 mg/dL (ref 0.44–1.00)
GFR calc Af Amer: >60 mL/min (ref >60)
GFR calc non Af Amer: >60 mL/min (ref >60)
Glucose, Bld: 77 mg/dL (ref 70–99)
Potassium: 3.6 mmol/L (ref 3.5–5.1)
Sodium: 140 mmol/L (ref 135–145)

## 2019-06-17 NOTE — Progress Notes (Signed)
PCP - Jenna Luo Cardiologist -   Chest x-ray -  EKG -  Stress Test -  ECHO -  Cardiac Cath -   Sleep Study -  CPAP -   Fasting Blood Sugar -  Checks Blood Sugar _____ times a day  Blood Thinner Instructions: Aspirin Instructions: Last Dose:  Anesthesia review:   Patient denies shortness of breath, fever, cough and chest pain at PAT appointment   none   Patient verbalized understanding of instructions that were given to them at the PAT appointment. Patient was also instructed that they will need to review over the PAT instructions again at home before surgery. note

## 2019-06-18 ENCOUNTER — Other Ambulatory Visit: Payer: Self-pay | Admitting: Obstetrics and Gynecology

## 2019-06-18 LAB — NOVEL CORONAVIRUS, NAA (HOSP ORDER, SEND-OUT TO REF LAB; TAT 18-24 HRS): SARS-CoV-2, NAA: NOT DETECTED

## 2019-06-20 ENCOUNTER — Other Ambulatory Visit: Payer: Self-pay

## 2019-06-20 ENCOUNTER — Encounter (HOSPITAL_BASED_OUTPATIENT_CLINIC_OR_DEPARTMENT_OTHER)
Admission: RE | Disposition: A | Discharge status: Home / Self Care | Payer: Self-pay | Source: Home / Self Care | Attending: Obstetrics and Gynecology

## 2019-06-20 ENCOUNTER — Ambulatory Visit (HOSPITAL_BASED_OUTPATIENT_CLINIC_OR_DEPARTMENT_OTHER): Payer: BC Managed Care – PPO | Admitting: Physician Assistant

## 2019-06-20 ENCOUNTER — Ambulatory Visit (HOSPITAL_BASED_OUTPATIENT_CLINIC_OR_DEPARTMENT_OTHER): Payer: BC Managed Care – PPO | Admitting: Anesthesiology

## 2019-06-20 ENCOUNTER — Encounter (HOSPITAL_BASED_OUTPATIENT_CLINIC_OR_DEPARTMENT_OTHER): Payer: Self-pay | Admitting: Obstetrics and Gynecology

## 2019-06-20 ENCOUNTER — Ambulatory Visit (HOSPITAL_BASED_OUTPATIENT_CLINIC_OR_DEPARTMENT_OTHER)
Admission: RE | Admit: 2019-06-20 | Discharge: 2019-06-20 | Disposition: A | Discharge status: Home / Self Care | Payer: BC Managed Care – PPO | Attending: Obstetrics and Gynecology | Admitting: Obstetrics and Gynecology

## 2019-06-20 DIAGNOSIS — K219 Gastro-esophageal reflux disease without esophagitis: Secondary | ICD-10-CM | POA: Diagnosis not present

## 2019-06-20 DIAGNOSIS — Z885 Allergy status to narcotic agent status: Secondary | ICD-10-CM | POA: Diagnosis not present

## 2019-06-20 DIAGNOSIS — D250 Submucous leiomyoma of uterus: Secondary | ICD-10-CM | POA: Insufficient documentation

## 2019-06-20 DIAGNOSIS — F419 Anxiety disorder, unspecified: Secondary | ICD-10-CM | POA: Diagnosis not present

## 2019-06-20 DIAGNOSIS — N920 Excessive and frequent menstruation with regular cycle: Secondary | ICD-10-CM | POA: Diagnosis not present

## 2019-06-20 DIAGNOSIS — D259 Leiomyoma of uterus, unspecified: Secondary | ICD-10-CM | POA: Diagnosis not present

## 2019-06-20 DIAGNOSIS — N800 Endometriosis of uterus: Secondary | ICD-10-CM | POA: Diagnosis not present

## 2019-06-20 DIAGNOSIS — N888 Other specified noninflammatory disorders of cervix uteri: Secondary | ICD-10-CM | POA: Insufficient documentation

## 2019-06-20 DIAGNOSIS — D649 Anemia, unspecified: Secondary | ICD-10-CM | POA: Diagnosis not present

## 2019-06-20 DIAGNOSIS — D251 Intramural leiomyoma of uterus: Secondary | ICD-10-CM | POA: Diagnosis not present

## 2019-06-20 DIAGNOSIS — D252 Subserosal leiomyoma of uterus: Secondary | ICD-10-CM | POA: Diagnosis not present

## 2019-06-20 DIAGNOSIS — Z87891 Personal history of nicotine dependence: Secondary | ICD-10-CM | POA: Diagnosis not present

## 2019-06-20 HISTORY — PX: ROBOTIC ASSISTED TOTAL HYSTERECTOMY: SHX6085

## 2019-06-20 LAB — TYPE AND SCREEN
ABO/RH(D): B POS
Antibody Screen: NEGATIVE

## 2019-06-20 SURGERY — HYSTERECTOMY, TOTAL, ROBOT-ASSISTED
Anesthesia: General | Site: Abdomen

## 2019-06-20 MED ORDER — LACTATED RINGERS IV SOLN
INTRAVENOUS | Status: DC
  Filled 2019-06-20: qty 1000

## 2019-06-20 MED ORDER — CELECOXIB 200 MG PO CAPS
ORAL_CAPSULE | ORAL | Status: AC
  Filled 2019-06-20: qty 2

## 2019-06-20 MED ORDER — TRAMADOL HCL 50 MG PO TABS
50.0000 mg | ORAL_TABLET | Freq: Four times a day (QID) | ORAL | Status: DC
  Filled 2019-06-20: qty 1

## 2019-06-20 MED ORDER — KETAMINE HCL 10 MG/ML IJ SOLN
INTRAMUSCULAR | Status: DC | PRN
  Administered 2019-06-20: 20 mg via INTRAVENOUS

## 2019-06-20 MED ORDER — LIDOCAINE 2% (20 MG/ML) 5 ML SYRINGE
INTRAMUSCULAR | Status: AC
  Filled 2019-06-20: qty 5

## 2019-06-20 MED ORDER — PROPOFOL 10 MG/ML IV BOLUS
INTRAVENOUS | Status: DC | PRN
  Administered 2019-06-20: 120 mg via INTRAVENOUS

## 2019-06-20 MED ORDER — FENTANYL CITRATE (PF) 100 MCG/2ML IJ SOLN
INTRAMUSCULAR | Status: DC | PRN
  Administered 2019-06-20 (×3): 50 ug via INTRAVENOUS

## 2019-06-20 MED ORDER — ONDANSETRON HCL 4 MG/2ML IJ SOLN
INTRAMUSCULAR | Status: AC
  Filled 2019-06-20: qty 2

## 2019-06-20 MED ORDER — ONDANSETRON HCL 4 MG/2ML IJ SOLN
4.0000 mg | Freq: Once | INTRAMUSCULAR | Status: DC | PRN
  Filled 2019-06-20: qty 2

## 2019-06-20 MED ORDER — PROPOFOL 500 MG/50ML IV EMUL
INTRAVENOUS | Status: AC
  Filled 2019-06-20: qty 50

## 2019-06-20 MED ORDER — TRAMADOL HCL 50 MG PO TABS: 50.0000 mg | ORAL_TABLET | Freq: Four times a day (QID) | ORAL | 0 refills | Status: DC | PRN

## 2019-06-20 MED ORDER — GABAPENTIN 300 MG PO CAPS
ORAL_CAPSULE | ORAL | Status: AC
  Filled 2019-06-20: qty 1

## 2019-06-20 MED ORDER — KETAMINE HCL 10 MG/ML IJ SOLN
INTRAMUSCULAR | Status: AC
  Filled 2019-06-20: qty 1

## 2019-06-20 MED ORDER — GABAPENTIN 300 MG PO CAPS
300.0000 mg | ORAL_CAPSULE | ORAL | Status: AC
  Administered 2019-06-20: 300 mg via ORAL
  Filled 2019-06-20: qty 1

## 2019-06-20 MED ORDER — SODIUM CHLORIDE 0.9 % IR SOLN
Status: DC | PRN
  Administered 2019-06-20: 3000 mL

## 2019-06-20 MED ORDER — KETOROLAC TROMETHAMINE 30 MG/ML IJ SOLN
INTRAMUSCULAR | Status: AC
  Filled 2019-06-20: qty 1

## 2019-06-20 MED ORDER — OXYCODONE HCL 5 MG/5ML PO SOLN
5.0000 mg | Freq: Once | ORAL | Status: DC | PRN
  Filled 2019-06-20: qty 5

## 2019-06-20 MED ORDER — ONDANSETRON HCL 4 MG/2ML IJ SOLN
4.0000 mg | Freq: Four times a day (QID) | INTRAMUSCULAR | Status: DC | PRN
  Filled 2019-06-20: qty 2

## 2019-06-20 MED ORDER — SUGAMMADEX SODIUM 200 MG/2ML IV SOLN
INTRAVENOUS | Status: DC | PRN
  Administered 2019-06-20: 200 mg via INTRAVENOUS

## 2019-06-20 MED ORDER — MIDAZOLAM HCL 2 MG/2ML IJ SOLN
INTRAMUSCULAR | Status: DC | PRN
  Administered 2019-06-20: 2 mg via INTRAVENOUS

## 2019-06-20 MED ORDER — TRAMADOL HCL 50 MG PO TABS
ORAL_TABLET | ORAL | Status: AC
  Filled 2019-06-20: qty 1

## 2019-06-20 MED ORDER — DOCUSATE SODIUM 100 MG PO CAPS
100.0000 mg | ORAL_CAPSULE | Freq: Two times a day (BID) | ORAL | Status: DC
  Filled 2019-06-20: qty 1

## 2019-06-20 MED ORDER — ONDANSETRON HCL 4 MG/2ML IJ SOLN
INTRAMUSCULAR | Status: DC | PRN
  Administered 2019-06-20: 4 mg via INTRAVENOUS

## 2019-06-20 MED ORDER — ESMOLOL HCL 100 MG/10ML IV SOLN
INTRAVENOUS | Status: DC | PRN
  Administered 2019-06-20: 30 ug via INTRAVENOUS
  Administered 2019-06-20: 20 ug via INTRAVENOUS

## 2019-06-20 MED ORDER — SIMETHICONE 80 MG PO CHEW
80.0000 mg | CHEWABLE_TABLET | Freq: Four times a day (QID) | ORAL | Status: DC | PRN
  Filled 2019-06-20: qty 1

## 2019-06-20 MED ORDER — MENTHOL 3 MG MT LOZG
1.0000 | LOZENGE | OROMUCOSAL | Status: DC | PRN
  Filled 2019-06-20: qty 9

## 2019-06-20 MED ORDER — SCOPOLAMINE 1 MG/3DAYS TD PT72
MEDICATED_PATCH | TRANSDERMAL | Status: AC
  Filled 2019-06-20: qty 1

## 2019-06-20 MED ORDER — FENTANYL CITRATE (PF) 100 MCG/2ML IJ SOLN
25.0000 ug | INTRAMUSCULAR | Status: DC | PRN
  Filled 2019-06-20: qty 1

## 2019-06-20 MED ORDER — ONDANSETRON HCL 4 MG PO TABS
4.0000 mg | ORAL_TABLET | Freq: Four times a day (QID) | ORAL | Status: DC | PRN
  Filled 2019-06-20: qty 1

## 2019-06-20 MED ORDER — DEXAMETHASONE SODIUM PHOSPHATE 10 MG/ML IJ SOLN
INTRAMUSCULAR | Status: DC | PRN
  Administered 2019-06-20: 10 mg via INTRAVENOUS

## 2019-06-20 MED ORDER — ROCURONIUM BROMIDE 10 MG/ML (PF) SYRINGE
PREFILLED_SYRINGE | INTRAVENOUS | Status: AC
  Filled 2019-06-20: qty 10

## 2019-06-20 MED ORDER — DIPHENHYDRAMINE HCL 50 MG/ML IJ SOLN
INTRAMUSCULAR | Status: DC | PRN
  Administered 2019-06-20: 6.25 mg via INTRAVENOUS

## 2019-06-20 MED ORDER — SCOPOLAMINE 1 MG/3DAYS TD PT72
1.0000 | MEDICATED_PATCH | TRANSDERMAL | Status: AC
  Administered 2019-06-20: 1 via TRANSDERMAL
  Filled 2019-06-20: qty 1

## 2019-06-20 MED ORDER — SODIUM CHLORIDE 0.9 % IV SOLN
2.0000 g | INTRAVENOUS | Status: AC
  Administered 2019-06-20: 2 g via INTRAVENOUS
  Filled 2019-06-20 (×3): qty 2

## 2019-06-20 MED ORDER — OXYCODONE HCL 5 MG PO TABS
5.0000 mg | ORAL_TABLET | Freq: Once | ORAL | Status: DC | PRN
  Filled 2019-06-20: qty 1

## 2019-06-20 MED ORDER — ROCURONIUM BROMIDE 50 MG/5ML IV SOSY
PREFILLED_SYRINGE | INTRAVENOUS | Status: DC | PRN
  Administered 2019-06-20: 50 mg via INTRAVENOUS
  Administered 2019-06-20 (×2): 20 mg via INTRAVENOUS

## 2019-06-20 MED ORDER — TRAMADOL HCL 50 MG PO TABS
50.0000 mg | ORAL_TABLET | Freq: Once | ORAL | Status: AC | PRN
  Administered 2019-06-20: 50 mg via ORAL
  Filled 2019-06-20: qty 1

## 2019-06-20 MED ORDER — SODIUM CHLORIDE 0.9 % IV SOLN
INTRAVENOUS | Status: DC | PRN
  Administered 2019-06-20: 60 mL

## 2019-06-20 MED ORDER — LACTATED RINGERS IV SOLN
INTRAVENOUS | Status: DC
  Administered 2019-06-20 (×2): via INTRAVENOUS
  Filled 2019-06-20 (×2): qty 1000

## 2019-06-20 MED ORDER — CELECOXIB 400 MG PO CAPS
400.0000 mg | ORAL_CAPSULE | ORAL | Status: AC
  Administered 2019-06-20: 400 mg via ORAL
  Filled 2019-06-20: qty 1

## 2019-06-20 MED ORDER — DIPHENHYDRAMINE HCL 50 MG/ML IJ SOLN
INTRAMUSCULAR | Status: AC
  Filled 2019-06-20: qty 1

## 2019-06-20 MED ORDER — ACETAMINOPHEN 500 MG PO TABS
1000.0000 mg | ORAL_TABLET | ORAL | Status: AC
  Administered 2019-06-20: 1000 mg via ORAL
  Filled 2019-06-20: qty 2

## 2019-06-20 MED ORDER — DEXAMETHASONE SODIUM PHOSPHATE 4 MG/ML IJ SOLN
4.0000 mg | INTRAMUSCULAR | Status: DC
  Filled 2019-06-20: qty 1

## 2019-06-20 MED ORDER — ACETAMINOPHEN 500 MG PO TABS
ORAL_TABLET | ORAL | Status: AC
  Filled 2019-06-20: qty 2

## 2019-06-20 MED ORDER — ESMOLOL HCL 100 MG/10ML IV SOLN
INTRAVENOUS | Status: AC
  Filled 2019-06-20: qty 10

## 2019-06-20 MED ORDER — ACETAMINOPHEN 500 MG PO TABS: ORAL_TABLET | ORAL | 1 refills | Status: DC

## 2019-06-20 MED ORDER — BUPIVACAINE HCL (PF) 0.25 % IJ SOLN
INTRAMUSCULAR | Status: DC | PRN
  Administered 2019-06-20: 50 mL

## 2019-06-20 MED ORDER — ACETAMINOPHEN 325 MG PO TABS
650.0000 mg | ORAL_TABLET | Freq: Four times a day (QID) | ORAL | Status: DC | PRN
  Filled 2019-06-20: qty 2

## 2019-06-20 MED ORDER — LIDOCAINE 2% (20 MG/ML) 5 ML SYRINGE
INTRAMUSCULAR | Status: DC | PRN
  Administered 2019-06-20: 60 mg via INTRAVENOUS

## 2019-06-20 MED ORDER — FENTANYL CITRATE (PF) 250 MCG/5ML IJ SOLN
INTRAMUSCULAR | Status: AC
  Filled 2019-06-20: qty 5

## 2019-06-20 MED ORDER — MIDAZOLAM HCL 2 MG/2ML IJ SOLN
INTRAMUSCULAR | Status: AC
  Filled 2019-06-20: qty 2

## 2019-06-20 MED ORDER — KETOROLAC TROMETHAMINE 30 MG/ML IJ SOLN
30.0000 mg | Freq: Once | INTRAMUSCULAR | Status: AC
  Administered 2019-06-20: 30 mg via INTRAVENOUS
  Filled 2019-06-20: qty 1

## 2019-06-20 MED ORDER — DEXAMETHASONE SODIUM PHOSPHATE 10 MG/ML IJ SOLN
INTRAMUSCULAR | Status: AC
  Filled 2019-06-20: qty 1

## 2019-06-20 MED ORDER — IBUPROFEN 600 MG PO TABS: ORAL_TABLET | ORAL | 1 refills | Status: DC

## 2019-06-20 SURGICAL SUPPLY — 70 items
ADH SKN CLS APL DERMABOND .7 (GAUZE/BANDAGES/DRESSINGS) ×1
BAG DRN RND TRDRP ANRFLXCHMBR (UROLOGICAL SUPPLIES) ×1
BAG URINE DRAIN 2000ML AR STRL (UROLOGICAL SUPPLIES) ×2 IMPLANT
BARRIER ADHS 3X4 INTERCEED (GAUZE/BANDAGES/DRESSINGS) ×3 IMPLANT
BLADE LAP MORCELLATOR 15MMX9.5 (ELECTROSURGICAL)
BLADE LAP MORCELLATOR 15X9.5 (ELECTROSURGICAL) IMPLANT
BLADE MORCELLATOR EXT  12.5X15 (ELECTROSURGICAL)
BLADE MORCELLATOR EXT 12.5X15 (ELECTROSURGICAL) IMPLANT
BRR ADH 4X3 ABS CNTRL BYND (GAUZE/BANDAGES/DRESSINGS) ×1
CANISTER SUCT 3000ML PPV (MISCELLANEOUS) ×3 IMPLANT
CLOSURE WOUND 1/2 X4 (GAUZE/BANDAGES/DRESSINGS) ×1
CLOSURE WOUND 1/4X4 (GAUZE/BANDAGES/DRESSINGS) ×1
COVER BACK TABLE 60X90IN (DRAPES) ×3 IMPLANT
COVER TIP SHEARS 8 DVNC (MISCELLANEOUS) ×1 IMPLANT
COVER TIP SHEARS 8MM DA VINCI (MISCELLANEOUS) ×2
COVER WAND RF STERILE (DRAPES) ×3 IMPLANT
DECANTER SPIKE VIAL GLASS SM (MISCELLANEOUS) ×6 IMPLANT
DEFOGGER SCOPE WARMER CLEARIFY (MISCELLANEOUS) ×3 IMPLANT
DERMABOND ADVANCED (GAUZE/BANDAGES/DRESSINGS) ×2
DERMABOND ADVANCED .7 DNX12 (GAUZE/BANDAGES/DRESSINGS) ×1 IMPLANT
DILATOR CANAL MILEX (MISCELLANEOUS) ×3 IMPLANT
DRAPE ARM DVNC X/XI (DISPOSABLE) ×4 IMPLANT
DRAPE COLUMN DVNC XI (DISPOSABLE) ×1 IMPLANT
DRAPE DA VINCI XI ARM (DISPOSABLE) ×8
DRAPE DA VINCI XI COLUMN (DISPOSABLE) ×2
DURAPREP 26ML APPLICATOR (WOUND CARE) ×3 IMPLANT
ELECT REM PT RETURN 9FT ADLT (ELECTROSURGICAL) ×3
ELECTRODE REM PT RTRN 9FT ADLT (ELECTROSURGICAL) ×1 IMPLANT
GAUZE 4X4 16PLY RFD (DISPOSABLE) ×3 IMPLANT
GLOVE BIO SURGEON STRL SZ7 (GLOVE) ×3 IMPLANT
GLOVE BIOGEL PI IND STRL 7.0 (GLOVE) ×5 IMPLANT
GLOVE BIOGEL PI INDICATOR 7.0 (GLOVE) ×10
GLOVE ECLIPSE 6.5 STRL STRAW (GLOVE) ×9 IMPLANT
IRRIG SUCT STRYKERFLOW 2 WTIP (MISCELLANEOUS) ×3
IRRIGATION SUCT STRKRFLW 2 WTP (MISCELLANEOUS) ×1 IMPLANT
LEGGING LITHOTOMY PAIR STRL (DRAPES) ×3 IMPLANT
OBTURATOR OPTICAL STANDARD 8MM (TROCAR) ×2
OBTURATOR OPTICAL STND 8 DVNC (TROCAR) ×1
OBTURATOR OPTICALSTD 8 DVNC (TROCAR) ×1 IMPLANT
OCCLUDER COLPOPNEUMO (BALLOONS) ×3 IMPLANT
PACK ROBOT WH (CUSTOM PROCEDURE TRAY) ×3 IMPLANT
PACK ROBOTIC GOWN (GOWN DISPOSABLE) ×3 IMPLANT
PACK TRENDGUARD 450 HYBRID PRO (MISCELLANEOUS) ×1 IMPLANT
PAD PREP 24X48 CUFFED NSTRL (MISCELLANEOUS) ×3 IMPLANT
POUCH LAPAROSCOPIC INSTRUMENT (MISCELLANEOUS) ×1 IMPLANT
PROTECTOR NERVE ULNAR (MISCELLANEOUS) ×7 IMPLANT
SEAL CANN UNIV 5-8 DVNC XI (MISCELLANEOUS) ×3 IMPLANT
SEAL XI 5MM-8MM UNIVERSAL (MISCELLANEOUS) ×8
SEALER VESSEL DA VINCI XI (MISCELLANEOUS) ×2
SEALER VESSEL EXT DVNC XI (MISCELLANEOUS) IMPLANT
SET TRI-LUMEN FLTR TB AIRSEAL (TUBING) ×3 IMPLANT
STRIP CLOSURE SKIN 1/2X4 (GAUZE/BANDAGES/DRESSINGS) ×2 IMPLANT
STRIP CLOSURE SKIN 1/4X4 (GAUZE/BANDAGES/DRESSINGS) ×2 IMPLANT
SUT MNCRL AB 3-0 PS2 27 (SUTURE) ×6 IMPLANT
SUT VIC AB 0 CT1 27 (SUTURE) ×6
SUT VIC AB 0 CT1 27XBRD ANBCTR (SUTURE) ×2 IMPLANT
SUT VICRYL 0 UR6 27IN ABS (SUTURE) ×3 IMPLANT
SUT VLOC 180 0 9IN  GS21 (SUTURE) ×4
SUT VLOC 180 0 9IN GS21 (SUTURE) ×2 IMPLANT
TIP RUMI ORANGE 6.7MMX12CM (TIP) IMPLANT
TIP UTERINE 5.1X6CM LAV DISP (MISCELLANEOUS) IMPLANT
TIP UTERINE 6.7X10CM GRN DISP (MISCELLANEOUS) IMPLANT
TIP UTERINE 6.7X6CM WHT DISP (MISCELLANEOUS) ×2 IMPLANT
TIP UTERINE 6.7X8CM BLUE DISP (MISCELLANEOUS) IMPLANT
TOWEL OR 17X26 10 PK STRL BLUE (TOWEL DISPOSABLE) ×3 IMPLANT
TRAY FOL W/BAG SLVR 16FR STRL (SET/KITS/TRAYS/PACK) ×1 IMPLANT
TRAY FOLEY W/BAG SLVR 16FR LF (SET/KITS/TRAYS/PACK) ×3
TRENDGUARD 450 HYBRID PRO PACK (MISCELLANEOUS) ×3
TROCAR PORT AIRSEAL 8X120 (TROCAR) ×3 IMPLANT
WATER STERILE IRR 1000ML POUR (IV SOLUTION) ×3 IMPLANT

## 2019-06-20 NOTE — Interval H&P Note (Signed)
History and Physical Interval Note:  06/20/2019 7:23 AM  Christy Collins  has presented today for surgery, with the diagnosis of Symptomatic Uterine Fibroids.  The various methods of treatment have been discussed with the patient and family. After consideration of risks, benefits and other options for treatment, the patient has consented to  Procedure(s): XI ROBOTIC ASSISTED TOTAL HYSTERECTOMY (N/A) with bilateral salpyngectomy as a surgical intervention.  The patient's history has been reviewed, patient examined, no change in status, stable for surgery.  I have reviewed the patient's chart and labs.  Questions were answered to the patient's satisfaction.     Katharine Look A Vienne Corcoran

## 2019-06-20 NOTE — Transfer of Care (Signed)
Immediate Anesthesia Transfer of Care Note  Patient: Christy Collins  Procedure(s) Performed: XI ROBOTIC ASSISTED TOTAL HYSTERECTOMY. BILATERAL SALPINGECTOMY (N/A Abdomen)  Patient Location: PACU  Anesthesia Type:General  Level of Consciousness: awake, alert  and oriented  Airway & Oxygen Therapy: Patient Spontanous Breathing and Patient connected to face mask oxygen  Post-op Assessment: Report given to RN and Post -op Vital signs reviewed and stable  Post vital signs: Reviewed and stable  Last Vitals:  Vitals Value Taken Time  BP 165/94 06/20/19 1113  Temp 36.3 C 06/20/19 1113  Pulse 83 06/20/19 1115  Resp 17 06/20/19 1115  SpO2 100 % 06/20/19 1115  Vitals shown include unvalidated device data.  Last Pain:  Vitals:   06/20/19 0607  TempSrc: Oral  PainSc: 0-No pain      Patients Stated Pain Goal: 5 ([id number])  Complications: No apparent anesthesia complications

## 2019-06-20 NOTE — Anesthesia Procedure Notes (Signed)
Procedure Name: Intubation Date/Time: 06/20/2019 8:02 AM Performed by: Genelle Bal, CRNA Pre-anesthesia Checklist: Patient identified, Emergency Drugs available, Suction available and Patient being monitored Patient Re-evaluated:Patient Re-evaluated prior to induction Oxygen Delivery Method: Circle system utilized Preoxygenation: Pre-oxygenation with 100% oxygen Induction Type: IV induction Ventilation: Mask ventilation without difficulty Laryngoscope Size: Miller and 2 Grade View: Grade I Tube type: Oral Tube size: 7.0 mm Number of attempts: 1 Airway Equipment and Method: Stylet Placement Confirmation: ETT inserted through vocal cords under direct vision,  positive ETCO2 and breath sounds checked- equal and bilateral Secured at: 21 cm Tube secured with: Tape Dental Injury: Teeth and Oropharynx as per pre-operative assessment

## 2019-06-20 NOTE — Discharge Instructions (Signed)
Call [facility] @ [phone] if:  You have a temperature greater than or equal to 100.4 degrees Farenheit orally You have pain that is not made better by the pain medication given and taken as directed You have excessive bleeding or problems urinating  Take Colace (Docusate Sodium/Stool Softener) 100 mg 2-3 times daily while taking narcotic pain medicine to avoid constipation or until bowel movements are regular.  Take together:  Ibuprofen 600 mg with food and Acetaminophen 1000 mg  (#2 500 mg tablets)  every 6 hours for 5 days then as needed for pain.  You may drive after 1 week You may walk up steps  You may shower tomorrow You may resume a regular diet  Keep incisions clean and dry Do not lift over 15 pounds for 6 weeks Avoid anything in vagina for 6 weeks

## 2019-06-20 NOTE — Op Note (Signed)
Preoperative diagnosis: uterine fibroids  Postoperative diagnosis: Same   Anesthesia: General  Anesthesiologist: Dr. Daiva Huge  Procedure: Robotically assisted total hysterectomy with bilateral salpingectomy  Surgeon: Dr. Katharine Look Tylar Merendino  Assistant: Earnstine Regal P.A.-C.  Estimated blood loss: 25 cc  Procedure:  After being informed of the planned procedure with possible complications including but not limited to bleeding, infection, injury to other organs, need for laparotomy, possible need for morcellation with risks and benefits reviewed, expected hospital stay and recovery, informed consent is obtained and patient is taken to or #6. She is placed in  lithotomy position on [place] with both arms padded and tucked on each side and bilateral knee-high sequential compressive devices. She is given general anesthesia with endotracheal intubation without any complication. She is prepped and draped in a sterile fashion. A three-way Foley catheter is inserted in her bladder.  Pelvic exam reveals: anteverted uterus irregular and enlarged to a 14 week size, 2 normal adnexa  A weighted speculum is inserted in the vagina and the anterior lip of the cervix is grasped with a tenaculum forcep. We proceed with a paracervical block and vaginal infiltration using ropivacaine 0.5% diluted 1 in 1 with saline. The uterus was then sounded at 7 cm. We easily dilate the cervix using Hegar dilator to  #27 which allows for easy placement of the intrauterine RUMI manipulator with a 4.0 KOH ring and a vaginal occluder. The ring is sutured to the cervix with 0 Vicryl.  Trocar placement is decided. We infiltrate 2 cm above the umbilicus with 10 cc of ropivacaine per protocol and perform a 10 mm vertical incision which is brought down bluntly to the fascia. The fascia is identified and grasped with Coker forceps. The fascia is incised with Mayo scissors. Peritoneum is entered bluntly. A pursestring suture of 0 Vicryl is  placed on the fascia and a 10 mm Hassan trocar is easily inserted in the abdominal cavity held in placed with a Purstring suture. This allows for easy insufflation of a pneumoperitoneum using warmed CO2 at a maximum pressure of 15 mm of mercury. 60 cc of Ropivacaine 0.5 % diluted 1 in 1 is sent in the pelvis and the patient is positioned in reverse Trendelenburg. We then placed two 105mm robotic trocar on the left, one 9mm robotic trocar on the right and one 8 mm patient's side assistant trocar on the right  after infiltrating every site  with ropivacaine per protocol. The robot is docked on the right of the patient after positioning her in Trendelenburg. A Vessel Seal is inserted in arm #4, a Long bipolar forceps is inserted in arm #2 and a Prograsp is inserted in arm #1.  Preparation and docking is completed in 55 minutes.  Observation: Uterus is enlarged by multiple fibroids. Largest is on the left cornua at 6 cm. 2 normal ovaries, 2 normal tubes s/p Pomeroy sterilization. Left pelvic adhesions between sigmoid and pelvic wall sharply dissected during procedure.  We start on the right side by sealing and cutting the mesosalpynx , the right utero-ovarian ligament and the right round ligament .  This gives [place] entry into the retroperitoneal space with an easy dissection of the anterior broad ligament. The ligament was opened all the way to the left round ligament.   We then proceed with systematic dissection of the bladder from the anterior vaginal cuff which is easily identified with the KOH ring. The plane of dissection is confirmed with filling the bladder with 200 cc of saline. We are  able to dissect the bladder 2 cm below the KOH ring. We then opened the posterior right broad ligament all the way to the posterior KOH ring after identifying the full course of the right ureter.   Moving to the left side we Seal and cut  the left round ligament , the left utero-ovarian ligament and  the mesosalpinx in  between. Entry into the retroperitoneal space allows [place] to complete dissection of the bladder on the left side and skeletonized the uterine vessels. The left broad ligament is then dissected all the way to the posterior KOH ring after identifying the full course of the left ureter.  Both tubes are removed from the pelvic cavity through the assistant's trocar.   With pressure on the KOH ring and the bladder fully dissected, we seal and cut the uterine vessels on both sides at the level of the KOH ring.  The vaginal occluder is inflated and we proceed with a 360 colpotomy using an open monopolar scissors and freeing the uterus entirely.  The uterus is delivered vaginally with traction only. The vaginal occluder is reinserted in the vagina to maintain pneumoperitoneum.  Instruments are then modified for a suture cut in arm #4 and a long tip forcep in arm #2. We proceed with closure of the vaginal cuff with 2 running sutures of 0 V-Lock. We irrigated profusely with warm saline and confirm a satisfactory hemostasis as well as 2 normal ureters with good mobility and no dilatation.  A sheet of Interceed , divided in 2, is placed on the vaginal cuff and behind the ovaries.  All instruments are then removed and the robot is undocked. Console time: 1 hours and 25 minutes.  All trochars are removed under direct visualization after evacuating the pneumoperitoneum.  The fascia of the supraumbilical incision is closed with the previously placed pursestring suture of 0 Vicryl. All incisions are then closed with subcuticular suture of 3-0 Monocryl and Dermabond.  A speculum is inserted in the vagina to confirm a adequate closure of the vaginal cuff and good hemostasis.  Instrument and sponge count is complete x2. Estimated blood loss is minimal. The procedure is well tolerated by the patient is taken to recovery room in a well and stable condition.  Specimen: Uterus and tubes weighing 241.5 g g sent to  pathology

## 2019-06-20 NOTE — Anesthesia Postprocedure Evaluation (Signed)
Anesthesia Post Note  Patient: REGANA PHONG  Procedure(s) Performed: XI ROBOTIC ASSISTED TOTAL HYSTERECTOMY. BILATERAL SALPINGECTOMY (N/A Abdomen)     Patient location during evaluation: PACU Anesthesia Type: General Level of consciousness: awake and alert and oriented Pain management: pain level controlled Vital Signs Assessment: post-procedure vital signs reviewed and stable Respiratory status: spontaneous breathing, nonlabored ventilation and respiratory function stable Cardiovascular status: blood pressure returned to baseline Postop Assessment: no apparent nausea or vomiting Anesthetic complications: no    Last Vitals:  Vitals:   06/20/19 1209 06/20/19 1244  BP: (!) 141/87 (!) 145/89  Pulse: 75 80  Resp: 18 18  Temp: 36.5 C   SpO2: 100% 100%    Last Pain:  Vitals:   06/20/19 1209  TempSrc:   PainSc: [place]

## 2019-06-20 NOTE — Progress Notes (Signed)
Patient ambulated down to [place].

## 2019-06-24 LAB — SURGICAL PATHOLOGY

## 2019-07-04 DIAGNOSIS — R399 Unspecified symptoms and signs involving the genitourinary system: Secondary | ICD-10-CM | POA: Diagnosis not present

## 2019-07-24 ENCOUNTER — Encounter: Payer: Self-pay | Admitting: Family Medicine

## 2019-07-24 ENCOUNTER — Telehealth (INDEPENDENT_AMBULATORY_CARE_PROVIDER_SITE_OTHER): Payer: BC Managed Care – PPO | Admitting: Family Medicine

## 2019-07-24 DIAGNOSIS — J0100 Acute maxillary sinusitis, unspecified: Secondary | ICD-10-CM | POA: Diagnosis not present

## 2019-07-24 MED ORDER — AMOXICILLIN-POT CLAVULANATE 875-125 MG PO TABS: 1.0000 | ORAL_TABLET | Freq: Two times a day (BID) | ORAL | 0 refills | Status: DC

## 2019-07-24 MED ORDER — FLUTICASONE PROPIONATE 50 MCG/ACT NA SUSP: 2.0000 | Freq: Every day | NASAL | 6 refills | Status: DC

## 2019-07-24 NOTE — Progress Notes (Signed)
Virtual Visit via Video Note  I connected with Christy Collins on 07/24/19 at 11:17am  by a video enabled telemedicine application and verified that I am speaking with the correct person using two identifiers.    Pt location: at home   Physician location:  In office, [facility], Vic Blackbird MD     On call: patient and physician   I discussed the limitations of evaluation and management by telemedicine and the availability of in person appointments. The patient expressed understanding and agreed to proceed.  History of Present Illness:   Video visit secondary to sick symptoms.  Patient had sinus pressure with mild drainage for the past week.  She has had pressure across the face and mild headache.  She has not had any cough or congestion.  She is not had any fever.  She does have known history of sinusitis has had multiple sinus infections in the past but they did improve when she quit smoking.  She was last treated in April 2020.  She does not have any loss of taste or smell that is significant.  She does not have any GI symptoms.  She has been using humidifier.  She has been using decongestant.  She does try antihistamines but they tend to dry her out significantly.  She did have hysterectomy performed at the beginning of December. Observations/Objective: No acute distress noted via video normal work of breathing.  Assessment and Plan: Acute sinusitis.  Based on her history we will go ahead and start her on medicine.  We will add in Flonase nasal spray for inflammation.  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  She will go ahead and set this up. sHe does get yeast infections after antibiotic she has some Diflucan on hand. Follow Up Instructions:    I discussed the assessment and treatment plan with the patient. The patient  was provided an opportunity to ask questions and all were answered. The patient agreed with the plan and demonstrated an understanding of the instructions.   The patient was advised to call back or seek an in-person evaluation if the symptoms worsen or if the condition fails to improve as anticipated.  I provided 40minutes of non-face-to-face time during this encounter. End Time 11:26am   Vic Blackbird, MD

## 2019-07-25 ENCOUNTER — Ambulatory Visit: Payer: BC Managed Care – PPO | Attending: Internal Medicine

## 2019-07-25 DIAGNOSIS — Z20822 Contact with and (suspected) exposure to covid-19: Secondary | ICD-10-CM | POA: Diagnosis not present

## 2019-07-26 LAB — NOVEL CORONAVIRUS, NAA: SARS-CoV-2, NAA: NOT DETECTED

## 2019-08-15 DIAGNOSIS — M7711 Lateral epicondylitis, right elbow: Secondary | ICD-10-CM | POA: Diagnosis not present

## 2019-08-15 DIAGNOSIS — M25531 Pain in right wrist: Secondary | ICD-10-CM | POA: Diagnosis not present

## 2019-08-26 ENCOUNTER — Encounter: Payer: Self-pay | Admitting: Family Medicine

## 2019-08-26 ENCOUNTER — Ambulatory Visit (INDEPENDENT_AMBULATORY_CARE_PROVIDER_SITE_OTHER): Payer: BC Managed Care – PPO | Admitting: Family Medicine

## 2019-08-26 ENCOUNTER — Other Ambulatory Visit: Payer: Self-pay

## 2019-08-26 VITALS — BP 110/74 | HR 88 | Temp 96.9°F | Resp 16 | Ht 63.5 in | Wt 123.0 lb

## 2019-08-26 DIAGNOSIS — B028 Zoster with other complications: Secondary | ICD-10-CM

## 2019-08-26 MED ORDER — VALACYCLOVIR HCL 1 G PO TABS: 1000.0000 mg | ORAL_TABLET | Freq: Three times a day (TID) | ORAL | 0 refills | Status: DC

## 2019-08-26 NOTE — Progress Notes (Signed)
Subjective:    Patient ID: Christy Collins, female    DOB: 08/16/1970, 49 y.o.   MRN: [id number]  HPI     Patient received her Covid vaccination late last week.  2 days ago she developed a burning sensation in her left arm.  Please see the photographs above.  The patient has numerous vesicles approximately 2 to 3 mm in diameter on erythematous base in clusters on her posterior left elbow and also on her anterior left forearm.  They are following the dermatome of T2 or T3.  She also complains of burning pain at the very top of her left chest just below the clavicle.  She also has a burning pins-and-needles pain in her back on the left-hand side near her scapula radiating to the midline.  She states that she feels like her arm is on fire. Past Medical History:  Diagnosis Date  . Achalasia   . Anemia   . Bacterial infection   . Constipation   . Eczema   . GERD (gastroesophageal reflux disease)    Chronic   . Heart murmur    light no follow up  . Hiatal hernia   . History of chicken pox   . Ovarian cyst   . PONV (postoperative nausea and vomiting)   . Stricture and stenosis of esophagus   . Vitiligo   . Yeast infection    Past Surgical History:  Procedure Laterality Date  . BREAST LUMPECTOMY    . DILATION AND CURETTAGE OF UTERUS    . DILITATION & CURRETTAGE/HYSTROSCOPY WITH VERSAPOINT RESECTION  06/08/2012   Procedure: DILATATION & CURETTAGE/HYSTEROSCOPY WITH VERSAPOINT RESECTION;  Surgeon: Alwyn Pea, MD;  Location: [facility];  Service: Gynecology;  Laterality: N/A;  and novasure  . ESOPHAGEAL MANOMETRY N/A 09/17/2012   Procedure: ESOPHAGEAL MANOMETRY (EM);  Surgeon: Sable Feil, MD;  Location: [facility];  Service: Endoscopy;  Laterality: N/A;  . ESOPHAGOGASTRODUODENOSCOPY N/A 11/05/2012   Procedure: ESOPHAGOGASTRODUODENOSCOPY (EGD);  Surgeon: Odis Hollingshead, MD;  Location: [facility];  Service: General;  Laterality: N/A;  . HELLER MYOTOMY N/A 11/05/2012   Procedure:  LAPAROSCOPIC ESOPHAGEAL MYOTOMY AND ANTERIOR FUNDOPLICATION;  Surgeon: Odis Hollingshead, MD;  Location: [facility];  Service: General;  Laterality: N/A;  . ROBOTIC ASSISTED TOTAL HYSTERECTOMY N/A 06/20/2019   Procedure: XI ROBOTIC ASSISTED TOTAL HYSTERECTOMY. BILATERAL SALPINGECTOMY;  Surgeon: Delsa Bern, MD;  Location: [facility];  Service: Gynecology;  Laterality: N/A;  . TONSILLECTOMY    . TUBAL LIGATION    . UTERINE FIBROID SURGERY     Current Outpatient Medications on File Prior to Visit  Medication Sig Dispense Refill  . acetaminophen (TYLENOL) 500 MG tablet Take #2 tablets po every 6 hours for 5 days then prn-post operative pain 30 tablet 1  . amoxicillin-clavulanate (AUGMENTIN) 875-125 MG tablet Take 1 tablet by mouth 2 (two) times daily. 20 tablet 0  . Black Cohosh 40 MG CAPS Take 80 mg by mouth daily.    . fluticasone (FLONASE) 50 MCG/ACT nasal spray Place 2 sprays into both nostrils daily. 16 g 6  . ibuprofen (ADVIL) 600 MG tablet Take 1 tablet po pc every 6 hours x 5 days then prn-post operative pain 30 tablet 1  . Multiple Vitamins-Minerals (MULTIPLE VITAMINS/WOMENS) tablet Take 1 tablet by mouth daily.    . traMADol (ULTRAM) 50 MG tablet Take 1 tablet (50 mg total) by mouth every 6 (six) hours as needed. 20 tablet 0   No current facility-administered  medications on file prior to visit.   Allergies  Allergen Reactions  . Other     Patient states that she cant take any pain meds and if she is put to sleep it makes her sick.   Social History   Socioeconomic History  . Marital status: Married    Spouse name: Not on file  . Number of children: 3  . Years of education: Not on file  . Highest education level: Not on file  Occupational History  . Occupation: CAP CASE MANAGER    Employer: [employer]  Tobacco Use  . Smoking status: Former Smoker    Packs/day: 0.50    Types: Cigarettes    Quit date: 11/05/2012    Years since quitting: 6.8  .  Smokeless tobacco: Never Used  Substance and Sexual Activity  . Alcohol use: Yes  . Drug use: No  . Sexual activity: Yes    Birth control/protection: Surgical  Other Topics Concern  . Not on file  Social History Narrative  . Not on file   Social Determinants of Health   Financial Resource Strain:   . Difficulty of Paying Living Expenses: Not on file  Food Insecurity:   . Worried About Charity fundraiser in the Last Year: Not on file  . Ran Out of Food in the Last Year: Not on file  Transportation Needs:   . Lack of Transportation (Medical): Not on file  . Lack of Transportation (Non-Medical): Not on file  Physical Activity:   . Days of Exercise per Week: Not on file  . Minutes of Exercise per Session: Not on file  Stress:   . Feeling of Stress : Not on file  Social Connections:   . Frequency of Communication with Friends and Family: Not on file  . Frequency of Social Gatherings with Friends and Family: Not on file  . Attends Religious Services: Not on file  . Active Member of Clubs or Organizations: Not on file  . Attends Archivist Meetings: Not on file  . Marital Status: Not on file  Intimate Partner Violence:   . Fear of Current or Ex-Partner: Not on file  . Emotionally Abused: Not on file  . Physically Abused: Not on file  . Sexually Abused: Not on file   Family History  Problem Relation Age of Onset  . Asthma Mother   . Hyperlipidemia Mother   . Alcohol abuse Father   . Hypertension Father   . Asthma Daughter   . Cancer Maternal Grandmother   . Cancer Paternal Uncle   . Hypertension Paternal Uncle   . Hypertension Paternal Aunt   . Diabetes Paternal Uncle   . Diabetes Paternal Aunt   . Rheumatic fever Daughter       Review of Systems  All other systems reviewed and are negative.      Objective:   Physical Exam  Constitutional: She appears well-developed and well-nourished. No distress.  HENT:  Head: Normocephalic and atraumatic.   Right Ear: External ear normal.  Left Ear: External ear normal.  Nose: Nose normal.  Mouth/Throat: Oropharynx is clear and moist. No oropharyngeal exudate.  Eyes: Conjunctivae are normal.  Cardiovascular: Normal rate, regular rhythm and normal heart sounds. Exam reveals no gallop and no friction rub.  No murmur heard. Pulmonary/Chest: Effort normal and breath sounds normal. No respiratory distress. She has no wheezes. She has no rales. She exhibits no tenderness.  Abdominal: Soft. Bowel sounds are normal. There is no  abdominal tenderness.  Musculoskeletal:        General: No edema. Normal range of motion.     Cervical back: Normal range of motion and neck supple.  Lymphadenopathy:    She has no cervical adenopathy.  Skin: Skin is warm. Rash noted. She is not diaphoretic.  Vitals reviewed.         Assessment & Plan:   Herpes zoster with complication  I believe the patient has developed a shingles outbreak in the T2, T3 dermatome area after receiving her recent COVID-19 vaccination.  Start the patient on Valtrex 1 g p.o. 3 times daily for 7 days.  Contact precautions to as.  If the patient develops severe pain, I will be glad to give her something for pain but she declines it today.

## 2019-09-21 ENCOUNTER — Encounter: Payer: Self-pay | Admitting: Family Medicine

## 2019-10-28 ENCOUNTER — Other Ambulatory Visit: Payer: Self-pay

## 2019-10-28 ENCOUNTER — Encounter: Payer: Self-pay | Admitting: Family Medicine

## 2019-10-28 ENCOUNTER — Ambulatory Visit (INDEPENDENT_AMBULATORY_CARE_PROVIDER_SITE_OTHER): Payer: BC Managed Care – PPO | Admitting: Family Medicine

## 2019-10-28 VITALS — BP 148/88 | HR 60 | Temp 97.6°F | Resp 14 | Ht 63.5 in | Wt 124.0 lb

## 2019-10-28 DIAGNOSIS — Z0000 Encounter for general adult medical examination without abnormal findings: Secondary | ICD-10-CM

## 2019-10-28 LAB — CBC WITH DIFFERENTIAL/PLATELET
Absolute Monocytes: 429 cells/uL (ref 200–950)
Basophils Absolute: 29 cells/uL (ref 0–200)
Basophils Relative: 0.5 %
Eosinophils Absolute: 110 cells/uL (ref 15–500)
Eosinophils Relative: 1.9 %
HCT: 40.9 % (ref 35.0–45.0)
Hemoglobin: 13.2 g/dL (ref 11.7–15.5)
Lymphs Abs: 2042 cells/uL (ref 850–3900)
MCH: 28.1 pg (ref 27.0–33.0)
MCHC: 32.3 g/dL (ref 32.0–36.0)
MCV: 87 fL (ref 80.0–100.0)
MPV: 10.1 fL (ref 7.5–12.5)
Monocytes Relative: 7.4 %
Neutro Abs: 3190 cells/uL (ref 1500–7800)
Neutrophils Relative %: 55 %
Platelets: 248 10*3/uL (ref 140–400)
RBC: 4.7 10*6/uL (ref 3.80–5.10)
RDW: 11.8 % (ref 11.0–15.0)
Total Lymphocyte: 35.2 %
WBC: 5.8 10*3/uL (ref 3.8–10.8)

## 2019-10-28 LAB — LIPID PANEL
Cholesterol: 145 mg/dL (ref <200)
HDL: 61 mg/dL (ref >=50)
LDL Cholesterol (Calc): 68 mg/dL (calc)
Non-HDL Cholesterol (Calc): 84 mg/dL (calc) (ref <130)
Total CHOL/HDL Ratio: 2.4 (calc) (ref <5.0)
Triglycerides: 75 mg/dL (ref <150)

## 2019-10-28 LAB — COMPLETE METABOLIC PANEL WITH GFR
AG Ratio: 1.7 (calc) (ref 1.0–2.5)
ALT: 10 U/L (ref 6–29)
AST: 18 U/L (ref 10–35)
Albumin: 4.3 g/dL (ref 3.6–5.1)
Alkaline phosphatase (APISO): 18 U/L — ABNORMAL LOW (ref 31–125)
BUN: 10 mg/dL (ref 7–25)
CO2: 27 mmol/L (ref 20–32)
Calcium: 9.3 mg/dL (ref 8.6–10.2)
Chloride: 104 mmol/L (ref 98–110)
Creat: 0.8 mg/dL (ref 0.50–1.10)
GFR, Est African American: 101 mL/min/{1.73_m2} (ref >=60)
GFR, Est Non African American: 87 mL/min/{1.73_m2} (ref >=60)
Globulin: 2.6 g/dL (calc) (ref 1.9–3.7)
Glucose, Bld: 88 mg/dL (ref 65–99)
Potassium: 4.6 mmol/L (ref 3.5–5.3)
Sodium: 138 mmol/L (ref 135–146)
Total Bilirubin: 0.4 mg/dL (ref 0.2–1.2)
Total Protein: 6.9 g/dL (ref 6.1–8.1)

## 2019-10-28 MED ORDER — CYCLOBENZAPRINE HCL 10 MG PO TABS: 10.0000 mg | ORAL_TABLET | Freq: Three times a day (TID) | ORAL | 0 refills | Status: DC | PRN

## 2019-10-28 NOTE — Progress Notes (Signed)
Subjective:    Patient ID: Christy Collins, female    DOB: [birth year], 49 y.o.   MRN: [id number]  HPI Patient is a very pleasant 49 year old African-American female who presents today for complete physical exam. She sees a gynecologist who performs her breast exams and her Pap smears and also her mammogram. These are all up-to-date and have been performed within the last year.  Blood pressure is elevated today in the patient admits to eating more salt recently.  She denies any chest pain shortness of breath or dyspnea on exertion.  She has not yet due for colonoscopy.  She does have a history of achalasia and gets occasional EGDs for this reason.  She also has a history of a hysterectomy although she still has her ovaries. Past Medical History:  Diagnosis Date  . Achalasia   . Anemia   . Bacterial infection   . Constipation   . Eczema   . GERD (gastroesophageal reflux disease)    Chronic   . Heart murmur    light no follow up  . Hiatal hernia   . History of chicken pox   . Ovarian cyst   . PONV (postoperative nausea and vomiting)   . Stricture and stenosis of esophagus   . Vitiligo   . Yeast infection    Past Surgical History:  Procedure Laterality Date  . BREAST LUMPECTOMY    . DILATION AND CURETTAGE OF UTERUS    . DILITATION & CURRETTAGE/HYSTROSCOPY WITH VERSAPOINT RESECTION  06/08/2012   Procedure: DILATATION & CURETTAGE/HYSTEROSCOPY WITH VERSAPOINT RESECTION;  Surgeon: Alwyn Pea, MD;  Location: [facility];  Service: Gynecology;  Laterality: N/A;  and novasure  . ESOPHAGEAL MANOMETRY N/A 09/17/2012   Procedure: ESOPHAGEAL MANOMETRY (EM);  Surgeon: Sable Feil, MD;  Location: [facility];  Service: Endoscopy;  Laterality: N/A;  . ESOPHAGOGASTRODUODENOSCOPY N/A 11/05/2012   Procedure: ESOPHAGOGASTRODUODENOSCOPY (EGD);  Surgeon: Odis Hollingshead, MD;  Location: [facility];  Service: General;  Laterality: N/A;  . HELLER MYOTOMY N/A 11/05/2012   Procedure: LAPAROSCOPIC  ESOPHAGEAL MYOTOMY AND ANTERIOR FUNDOPLICATION;  Surgeon: Odis Hollingshead, MD;  Location: [facility];  Service: General;  Laterality: N/A;  . ROBOTIC ASSISTED TOTAL HYSTERECTOMY N/A 06/20/2019   Procedure: XI ROBOTIC ASSISTED TOTAL HYSTERECTOMY. BILATERAL SALPINGECTOMY;  Surgeon: Delsa Bern, MD;  Location: [facility];  Service: Gynecology;  Laterality: N/A;  . TONSILLECTOMY    . TUBAL LIGATION    . UTERINE FIBROID SURGERY     Current Outpatient Medications on File Prior to Visit  Medication Sig Dispense Refill  . Black Cohosh 40 MG CAPS Take 80 mg by mouth daily.    . Multiple Vitamins-Minerals (MULTIPLE VITAMINS/WOMENS) tablet Take 1 tablet by mouth daily.     No current facility-administered medications on file prior to visit.   Allergies  Allergen Reactions  . Other     Patient states that she cant take any pain meds and if she is put to sleep it makes her sick.   Social History   Socioeconomic History  . Marital status: Married    Spouse name: Not on file  . Number of children: 3  . Years of education: Not on file  . Highest education level: Not on file  Occupational History  . Occupation: CAP CASE MANAGER    Employer: [employer]  Tobacco Use  . Smoking status: Former Smoker    Packs/day: 0.50    Types: Cigarettes    Quit date: 11/05/2012  Years since quitting: 6.9  . Smokeless tobacco: Never Used  Substance and Sexual Activity  . Alcohol use: Yes  . Drug use: No  . Sexual activity: Yes    Birth control/protection: Surgical  Other Topics Concern  . Not on file  Social History Narrative  . Not on file   Social Determinants of Health   Financial Resource Strain:   . Difficulty of Paying Living Expenses:   Food Insecurity:   . Worried About Charity fundraiser in the Last Year:   . Arboriculturist in the Last Year:   Transportation Needs:   . Film/video editor (Medical):   Marland Kitchen Lack of Transportation (Non-Medical):   Physical  Activity:   . Days of Exercise per Week:   . Minutes of Exercise per Session:   Stress:   . Feeling of Stress :   Social Connections:   . Frequency of Communication with Friends and Family:   . Frequency of Social Gatherings with Friends and Family:   . Attends Religious Services:   . Active Member of Clubs or Organizations:   . Attends Archivist Meetings:   Marland Kitchen Marital Status:   Intimate Partner Violence:   . Fear of Current or Ex-Partner:   . Emotionally Abused:   Marland Kitchen Physically Abused:   . Sexually Abused:    Family History  Problem Relation Age of Onset  . Asthma Mother   . Hyperlipidemia Mother   . Alcohol abuse Father   . Hypertension Father   . Asthma Daughter   . Cancer Maternal Grandmother   . Cancer Paternal Uncle   . Hypertension Paternal Uncle   . Hypertension Paternal Aunt   . Diabetes Paternal Uncle   . Diabetes Paternal Aunt   . Rheumatic fever Daughter       Review of Systems  All other systems reviewed and are negative.      Objective:   Physical Exam  Constitutional: She is oriented to person, place, and time. She appears well-developed and well-nourished. No distress.  HENT:  Head: Normocephalic and atraumatic.  Right Ear: External ear normal.  Left Ear: External ear normal.  Nose: Nose normal.  Mouth/Throat: Oropharynx is clear and moist. No oropharyngeal exudate.  Eyes: Pupils are equal, round, and reactive to light. Conjunctivae and EOM are normal. Right eye exhibits no discharge. Left eye exhibits no discharge. No scleral icterus.  Neck: No JVD present.  Cardiovascular: Normal rate, regular rhythm and normal heart sounds. Exam reveals no gallop and no friction rub.  No murmur heard. Pulmonary/Chest: Effort normal and breath sounds normal. No respiratory distress. She has no wheezes. She has no rales. She exhibits no tenderness.  Abdominal: Soft. Bowel sounds are normal. She exhibits no distension and no mass. There is no abdominal  tenderness. There is no rebound and no guarding.  Musculoskeletal:        General: No tenderness, deformity or edema. Normal range of motion.     Cervical back: Normal range of motion and neck supple.  Lymphadenopathy:    She has no cervical adenopathy.  Neurological: She is alert and oriented to person, place, and time. She has normal reflexes. No cranial nerve deficit. She exhibits normal muscle tone. Coordination normal.  Skin: Skin is warm. Rash noted. She is not diaphoretic. No pallor.  Psychiatric: She has a normal mood and affect. Her behavior is normal. Judgment and thought content normal.  Vitals reviewed.  Assessment & Plan:  General medical exam - Plan: CBC with Differential/Platelet, COMPLETE METABOLIC PANEL WITH GFR, Lipid panel  Physical exam today is normal except for her elevated blood pressure.  Of asked the patient to check her blood pressure twice a day for 1 week at home and notify me of values.  If consistently greater than 140/90, I would recommend starting hydrochlorothiazide.  I also recommended decreasing her sodium consumption.  Her mammogram and Pap smear will be performed at her gynecologist.  Of asked the patient to sign a release of information form so that we can get this information scanned into her chart.  Her colonoscopy is not yet due.  I offer the patient HIV testing and a tetanus shot but she politely declined.  She has had a Covid vaccination.

## 2019-10-29 DIAGNOSIS — Z01419 Encounter for gynecological examination (general) (routine) without abnormal findings: Secondary | ICD-10-CM | POA: Diagnosis not present

## 2019-10-29 DIAGNOSIS — Z6820 Body mass index (BMI) 20.0-20.9, adult: Secondary | ICD-10-CM | POA: Diagnosis not present

## 2019-10-29 DIAGNOSIS — Z1213 Encounter for screening for malignant neoplasm of small intestine: Secondary | ICD-10-CM | POA: Diagnosis not present

## 2019-10-29 DIAGNOSIS — Z1231 Encounter for screening mammogram for malignant neoplasm of breast: Secondary | ICD-10-CM | POA: Diagnosis not present

## 2019-10-29 DIAGNOSIS — Z1211 Encounter for screening for malignant neoplasm of colon: Secondary | ICD-10-CM | POA: Diagnosis not present

## 2020-02-14 DIAGNOSIS — H5213 Myopia, bilateral: Secondary | ICD-10-CM | POA: Diagnosis not present

## 2020-06-20 ENCOUNTER — Encounter (HOSPITAL_COMMUNITY): Payer: Self-pay | Admitting: *Deleted

## 2020-06-20 ENCOUNTER — Other Ambulatory Visit: Payer: Self-pay

## 2020-06-20 ENCOUNTER — Ambulatory Visit (INDEPENDENT_AMBULATORY_CARE_PROVIDER_SITE_OTHER): Payer: BC Managed Care – PPO

## 2020-06-20 ENCOUNTER — Ambulatory Visit (HOSPITAL_COMMUNITY)
Admission: EM | Admit: 2020-06-20 | Discharge: 2020-06-20 | Disposition: A | Discharge status: Home / Self Care | Payer: BC Managed Care – PPO

## 2020-06-20 DIAGNOSIS — R079 Chest pain, unspecified: Secondary | ICD-10-CM

## 2020-06-20 DIAGNOSIS — M546 Pain in thoracic spine: Secondary | ICD-10-CM | POA: Diagnosis not present

## 2020-06-20 LAB — POCT URINALYSIS DIPSTICK, ED / UC
Bilirubin Urine: NEGATIVE
Glucose, UA: NEGATIVE mg/dL
Hgb urine dipstick: NEGATIVE
Ketones, ur: NEGATIVE mg/dL
Leukocytes,Ua: NEGATIVE
Nitrite: NEGATIVE
Protein, ur: NEGATIVE mg/dL
Specific Gravity, Urine: 1.025 (ref 1.005–1.030)
Urobilinogen, UA: 0.2 mg/dL (ref 0.0–1.0)
pH: 7 (ref 5.0–8.0)

## 2020-06-20 LAB — POC URINE PREG, ED: Preg Test, Ur: NEGATIVE

## 2020-06-20 MED ORDER — NAPROXEN 500 MG PO TABS: 500.0000 mg | ORAL_TABLET | Freq: Two times a day (BID) | ORAL | 0 refills | Status: DC

## 2020-06-20 NOTE — ED Triage Notes (Signed)
Denies injury.  C/O right posterior mid-back pain x 2 days; states area not tender to palpation, but pain is much worse with breathing and any movements.

## 2020-06-20 NOTE — Discharge Instructions (Addendum)
Apply ice and heat to back 15 minutes 4 times per day Take medication as prescribed Return with new or worsening symptoms Follow up with PCP if no improvement.

## 2020-06-20 NOTE — ED Provider Notes (Signed)
[facility]    CSN: [id number] Arrival date & time: 06/20/20  1015      History   Chief Complaint Chief Complaint  Patient presents with   Back Pain    HPI Christy Collins is a 49 y.o. female.   Patient here c/w R sided mid thoracic back pain x 3 days. Pain does not radiate and has not improved nor worsened. Denies injury, fall, or trauma.  Worse by sitting to long, coughing, stretching to the left.  Nothing makes better.  Denies f/c, n/v/d, abdominal pain, coughing, wheezing, SOB, GU sx.  No advil or tylenol today.     Past Medical History:  Diagnosis Date   Achalasia    Anemia    Bacterial infection    Constipation    Eczema    GERD (gastroesophageal reflux disease)    Chronic    Heart murmur    light no follow up   Hiatal hernia    History of chicken pox    Ovarian cyst    PONV (postoperative nausea and vomiting)    Stricture and stenosis of esophagus    Vitiligo    Yeast infection     Patient Active Problem List   Diagnosis Date Noted   Menorrhagia 06/20/2019   Eczema 04/19/2018   Hiatal hernia 04/19/2018   Uterine leiomyoma 04/19/2018   Vitiligo    ANXIETY 04/29/2008   ACHALASIA s/p laparoscopic Heller myotomy and Dor Fundoplication 03/47/4259   ESOPHAGEAL STRICTURE 04/24/2008   GERD 04/24/2008   CONSTIPATION 04/24/2008    Past Surgical History:  Procedure Laterality Date   ABDOMINAL HYSTERECTOMY     BREAST LUMPECTOMY     DILATION AND CURETTAGE OF UTERUS     DILITATION & CURRETTAGE/HYSTROSCOPY WITH VERSAPOINT RESECTION  06/08/2012   Procedure: DILATATION & CURETTAGE/HYSTEROSCOPY WITH VERSAPOINT RESECTION;  Surgeon: Alwyn Pea, MD;  Location: [facility];  Service: Gynecology;  Laterality: N/A;  and novasure   ESOPHAGEAL MANOMETRY N/A 09/17/2012   Procedure: ESOPHAGEAL MANOMETRY (EM);  Surgeon: Sable Feil, MD;  Location: [facility];  Service: Endoscopy;  Laterality: N/A;    ESOPHAGOGASTRODUODENOSCOPY N/A 11/05/2012   Procedure: ESOPHAGOGASTRODUODENOSCOPY (EGD);  Surgeon: Odis Hollingshead, MD;  Location: [facility];  Service: General;  Laterality: N/A;   HELLER MYOTOMY N/A 11/05/2012   Procedure: LAPAROSCOPIC ESOPHAGEAL MYOTOMY AND ANTERIOR FUNDOPLICATION;  Surgeon: Odis Hollingshead, MD;  Location: [facility];  Service: General;  Laterality: N/A;   ROBOTIC ASSISTED TOTAL HYSTERECTOMY N/A 06/20/2019   Procedure: XI ROBOTIC ASSISTED TOTAL HYSTERECTOMY. BILATERAL SALPINGECTOMY;  Surgeon: Delsa Bern, MD;  Location: [facility];  Service: Gynecology;  Laterality: N/A;   TONSILLECTOMY     TUBAL LIGATION     UTERINE FIBROID SURGERY      OB History    Gravida  4   Para  3   Term      Preterm      AB  1   Living  3     SAB      IAB      Ectopic      Multiple      Live Births  3            Home Medications    Prior to Admission medications   Medication Sig Start Date End Date Taking? Authorizing Provider  Black Cohosh 40 MG CAPS Take 80 mg by mouth daily.   Yes [provider]  EVENING PRIMROSE OIL PO Take by mouth.  Yes [provider]  Probiotic Product (PROBIOTIC PO) Take by mouth.   Yes [provider]  cyclobenzaprine (FLEXERIL) 10 MG tablet Take 1 tablet (10 mg total) by mouth 3 (three) times daily as needed for muscle spasms. 10/28/19   Susy Frizzle, MD  Multiple Vitamins-Minerals (MULTIPLE VITAMINS/WOMENS) tablet Take 1 tablet by mouth daily.    [provider]  naproxen (NAPROSYN) 500 MG tablet Take 1 tablet (500 mg total) by mouth 2 (two) times daily. 06/20/20   Peri Jefferson, PA-C    Family History Family History  Problem Relation Age of Onset   Asthma Mother    Hyperlipidemia Mother    Alcohol abuse Father    Hypertension Father    Asthma Daughter    Cancer Maternal Grandmother    Cancer Paternal Uncle    Hypertension Paternal Uncle    Hypertension  Paternal Aunt    Diabetes Paternal Uncle    Diabetes Paternal Aunt    Rheumatic fever Daughter     Social History Social History   Tobacco Use   Smoking status: Former Smoker    Packs/day: 0.50    Types: Cigarettes    Quit date: 11/05/2012    Years since quitting: 7.6   Smokeless tobacco: Never Used  Vaping Use   Vaping Use: Never used  Substance Use Topics   Alcohol use: Yes    Comment: occasionally   Drug use: No     Allergies   Other   Review of Systems Review of Systems  Constitutional: Negative for chills and fever.  HENT: Negative for ear pain and sore throat.   Eyes: Negative for pain and visual disturbance.  Respiratory: Negative for cough and shortness of breath.   Cardiovascular: Negative for chest pain and palpitations.  Gastrointestinal: Negative for abdominal pain, diarrhea, nausea and vomiting.  Genitourinary: Negative for dysuria, hematuria, urgency, vaginal bleeding, vaginal discharge and vaginal pain.  Musculoskeletal: Positive for back pain. Negative for arthralgias and myalgias.  Skin: Negative for color change and rash.  Neurological: Negative for seizures and syncope.  Psychiatric/Behavioral: Negative for sleep disturbance.  All other systems reviewed and are negative.    Physical Exam Triage Vital Signs ED Triage Vitals  Enc Vitals Group     BP 06/20/20 1142 139/82     Pulse Rate 06/20/20 1142 70     Resp 06/20/20 1142 18     Temp 06/20/20 1142 98.1 F (36.7 C)     Temp Source 06/20/20 1142 Oral     SpO2 06/20/20 1142 100 %     Weight --      Height --      Head Circumference --      Peak Flow --      Pain Score 06/20/20 1143 8     Pain Loc --      Pain Edu? --      Excl. in [place]? --    No data found.  Updated Vital Signs BP 139/82    Pulse 70    Temp 98.1 F (36.7 C) (Oral)    Resp 18    LMP 06/20/2019    SpO2 100%   Visual Acuity Right Eye Distance:   Left Eye Distance:   Bilateral Distance:    Right Eye Near:    Left Eye Near:    Bilateral Near:     Physical Exam Vitals and nursing note reviewed.  Constitutional:      General: She is not in acute distress.  Appearance: Normal appearance. She is not ill-appearing.  HENT:     Head: Normocephalic and atraumatic.     Right Ear: Tympanic membrane and ear canal normal.     Left Ear: Tympanic membrane and ear canal normal.     Nose: No congestion or rhinorrhea.     Mouth/Throat:     Pharynx: No oropharyngeal exudate or posterior oropharyngeal erythema.  Eyes:     General: No scleral icterus.    Extraocular Movements: Extraocular movements intact.     Conjunctiva/sclera: Conjunctivae normal.  Cardiovascular:     Rate and Rhythm: Normal rate and regular rhythm.     Heart sounds: No murmur heard.   Pulmonary:     Effort: Pulmonary effort is normal. No respiratory distress.     Breath sounds: Normal breath sounds. No wheezing or rales.  Abdominal:     General: There is no distension.     Tenderness: There is no abdominal tenderness. There is no right CVA tenderness, left CVA tenderness, guarding or rebound.  Musculoskeletal:        General: No swelling or deformity. Normal range of motion.     Cervical back: Normal range of motion. No rigidity.     Thoracic back: Tenderness present. No edema, signs of trauma or bony tenderness. Normal range of motion.       Back:  Skin:    Capillary Refill: Capillary refill takes less than 2 seconds.     Coloration: Skin is not jaundiced.     Findings: No rash.  Neurological:     General: No focal deficit present.     Mental Status: She is alert and oriented to person, place, and time.     Motor: No weakness.     Gait: Gait normal.  Psychiatric:        Mood and Affect: Mood normal.        Behavior: Behavior normal.      UC Treatments / Results  Labs (all labs ordered are listed, but only abnormal results are displayed) Labs Reviewed  POC URINE PREG, ED  POCT URINALYSIS DIPSTICK, ED / UC     EKG   Radiology DG Chest 2 View  Result Date: 06/20/2020 CLINICAL DATA:  Chest pain. EXAM: CHEST - 2 VIEW COMPARISON:  Chest radiograph 03/28/2012 FINDINGS: The cardiomediastinal contours are within normal limits. The lungs are clear. No pneumothorax or pleural effusion. No acute finding in the visualized skeleton. IMPRESSION: No acute cardiopulmonary process. Electronically Signed   By: Audie Pinto M.D.   On: 06/20/2020 12:53    Procedures Procedures (including critical care time)  Medications Ordered in UC Medications - No data to display  Initial Impression / Assessment and Plan / UC Course  I have reviewed the triage vital signs and the nursing notes.  Pertinent labs & imaging results that were available during my care of the patient were reviewed by me and considered in my medical decision making (see chart for details).     Apply heat and ice to back 15 minutes 4 times per day Stretch back Take medication Follow up with PCP if no improvement Return with new or worsening symptoms. Final Clinical Impressions(s) / UC Diagnoses   Final diagnoses:  Acute right-sided thoracic back pain     Discharge Instructions     Apply ice and heat to back 15 minutes 4 times per day Take medication as prescribed Return with new or worsening symptoms Follow up with PCP if no improvement.  ED Prescriptions    Medication Sig Dispense Auth. Provider   naproxen (NAPROSYN) 500 MG tablet Take 1 tablet (500 mg total) by mouth 2 (two) times daily. 30 tablet Peri Jefferson, PA-C     PDMP not reviewed this encounter.   Peri Jefferson, PA-C 06/20/20 1314

## 2020-07-07 ENCOUNTER — Other Ambulatory Visit: Payer: Self-pay

## 2020-07-07 ENCOUNTER — Ambulatory Visit (INDEPENDENT_AMBULATORY_CARE_PROVIDER_SITE_OTHER): Payer: BC Managed Care – PPO | Admitting: Family Medicine

## 2020-07-07 VITALS — BP 120/80 | HR 86 | Temp 98.7°F | Ht 63.0 in | Wt 130.0 lb

## 2020-07-07 DIAGNOSIS — R509 Fever, unspecified: Secondary | ICD-10-CM | POA: Diagnosis not present

## 2020-07-07 NOTE — Progress Notes (Signed)
Subjective:    Patient ID: Christy Collins, female    DOB: 1971-03-27, 49 y.o.   MRN: [id number]  HPI Patient is a very pleasant 49 year old African-American female who presents today with a flulike illness.  She states that symptoms started 48 hours ago.  Symptoms include subjective fevers and chills, headache that is dull and generalized, rhinorrhea, cough which is nonproductive, and body aches.  Her daughter recently recovered from Covid although they were not around each other.  The patient is fully vaccinated.  She denies any nausea or vomiting or diarrhea.  She denies any rash.  She denies any chest pain or pleurisy.  She does have some mild shortness of breath however pulse oximetry is 94% on room air and her lungs are completely clear to auscultation bilaterally.  The remainder of her physical exam is totally normal.  She does have some mild pain in her right ear however examination of the eardrum is normal Past Medical History:  Diagnosis Date  . Achalasia   . Anemia   . Bacterial infection   . Constipation   . Eczema   . GERD (gastroesophageal reflux disease)    Chronic   . Heart murmur    light no follow up  . Hiatal hernia   . History of chicken pox   . Ovarian cyst   . PONV (postoperative nausea and vomiting)   . Stricture and stenosis of esophagus   . Vitiligo   . Yeast infection    Past Surgical History:  Procedure Laterality Date  . ABDOMINAL HYSTERECTOMY    . BREAST LUMPECTOMY    . DILATION AND CURETTAGE OF UTERUS    . DILITATION & CURRETTAGE/HYSTROSCOPY WITH VERSAPOINT RESECTION  06/08/2012   Procedure: DILATATION & CURETTAGE/HYSTEROSCOPY WITH VERSAPOINT RESECTION;  Surgeon: Esmeralda Arthur, MD;  Location: [facility];  Service: Gynecology;  Laterality: N/A;  and novasure  . ESOPHAGEAL MANOMETRY N/A 09/17/2012   Procedure: ESOPHAGEAL MANOMETRY (EM);  Surgeon: Mardella Layman, MD;  Location: [facility];  Service: Endoscopy;  Laterality: N/A;  .  ESOPHAGOGASTRODUODENOSCOPY N/A 11/05/2012   Procedure: ESOPHAGOGASTRODUODENOSCOPY (EGD);  Surgeon: Adolph Pollack, MD;  Location: [facility];  Service: General;  Laterality: N/A;  . HELLER MYOTOMY N/A 11/05/2012   Procedure: LAPAROSCOPIC ESOPHAGEAL MYOTOMY AND ANTERIOR FUNDOPLICATION;  Surgeon: Adolph Pollack, MD;  Location: [facility];  Service: General;  Laterality: N/A;  . ROBOTIC ASSISTED TOTAL HYSTERECTOMY N/A 06/20/2019   Procedure: XI ROBOTIC ASSISTED TOTAL HYSTERECTOMY. BILATERAL SALPINGECTOMY;  Surgeon: Silverio Lay, MD;  Location: [facility];  Service: Gynecology;  Laterality: N/A;  . TONSILLECTOMY    . TUBAL LIGATION    . UTERINE FIBROID SURGERY     Current Outpatient Medications on File Prior to Visit  Medication Sig Dispense Refill  . Black Cohosh 40 MG CAPS Take 80 mg by mouth daily.    Marland Kitchen EVENING PRIMROSE OIL PO Take by mouth.    . Multiple Vitamins-Minerals (MULTIPLE VITAMINS/WOMENS) tablet Take 1 tablet by mouth daily.    . naproxen (NAPROSYN) 500 MG tablet Take 1 tablet (500 mg total) by mouth 2 (two) times daily. 30 tablet 0  . Probiotic Product (PROBIOTIC PO) Take by mouth.    . cyclobenzaprine (FLEXERIL) 10 MG tablet Take 1 tablet (10 mg total) by mouth 3 (three) times daily as needed for muscle spasms. (Patient not taking: Reported on 07/07/2020) 30 tablet 0   No current facility-administered medications on file prior to visit.   Allergies  Allergen Reactions  . Other     Patient states that she cant take any pain meds and if she is put to sleep it makes her sick.   Social History   Socioeconomic History  . Marital status: Married    Spouse name: Not on file  . Number of children: 3  . Years of education: Not on file  . Highest education level: Not on file  Occupational History  . Occupation: CAP CASE MANAGER    Employer: [employer]  Tobacco Use  . Smoking status: Former Smoker    Packs/day: 0.50    Types: Cigarettes    Quit date:  11/05/2012    Years since quitting: 7.6  . Smokeless tobacco: Never Used  Vaping Use  . Vaping Use: Never used  Substance and Sexual Activity  . Alcohol use: Yes    Comment: occasionally  . Drug use: No  . Sexual activity: Not on file  Other Topics Concern  . Not on file  Social History Narrative  . Not on file   Social Determinants of Health   Financial Resource Strain: Not on file  Food Insecurity: Not on file  Transportation Needs: Not on file  Physical Activity: Not on file  Stress: Not on file  Social Connections: Not on file  Intimate Partner Violence: Not on file   Family History  Problem Relation Age of Onset  . Asthma Mother   . Hyperlipidemia Mother   . Alcohol abuse Father   . Hypertension Father   . Asthma Daughter   . Cancer Maternal Grandmother   . Cancer Paternal Uncle   . Hypertension Paternal Uncle   . Hypertension Paternal Aunt   . Diabetes Paternal Uncle   . Diabetes Paternal Aunt   . Rheumatic fever Daughter       Review of Systems  All other systems reviewed and are negative.      Objective:   Physical Exam Vitals reviewed.  Constitutional:      General: She is not in acute distress.    Appearance: She is well-developed. She is not diaphoretic.  HENT:     Head: Normocephalic and atraumatic.     Right Ear: Tympanic membrane, ear canal and external ear normal.     Left Ear: Tympanic membrane, ear canal and external ear normal.     Nose: Nose normal.     Mouth/Throat:     Pharynx: No oropharyngeal exudate.  Eyes:     Conjunctiva/sclera: Conjunctivae normal.  Cardiovascular:     Rate and Rhythm: Normal rate and regular rhythm.     Heart sounds: Normal heart sounds. No murmur heard. No friction rub. No gallop.   Pulmonary:     Effort: Pulmonary effort is normal. No respiratory distress.     Breath sounds: Normal breath sounds. No stridor. No wheezing, rhonchi or rales.  Chest:     Chest wall: No tenderness.  Abdominal:      General: Bowel sounds are normal.     Palpations: Abdomen is soft.     Tenderness: There is no abdominal tenderness.  Musculoskeletal:        General: Normal range of motion.     Cervical back: Normal range of motion and neck supple.  Lymphadenopathy:     Cervical: No cervical adenopathy.  Skin:    General: Skin is warm.           Assessment & Plan:   Fever, unspecified fever cause - Plan: COVID19 and  Influenza A & B  Patient has flulike symptoms.  I suspect either influenza, viral upper respiratory infection, or COVID-19.  Recommended the patient quarantine.  Meanwhile I will perform a screen for COVID-19 as well as influenza.  Recommended supportive care including ibuprofen and Tylenol for fever and body aches and pushing fluids.  If she develops shortness of breath she is instructed to go to the emergency room however at the present time, her examination is normal.

## 2020-07-09 ENCOUNTER — Encounter: Payer: Self-pay | Admitting: Family Medicine

## 2020-07-09 ENCOUNTER — Other Ambulatory Visit: Payer: BC Managed Care – PPO

## 2020-07-10 LAB — SARS-COVID-2 RNA(COVID19)AND INFLUENZA A&B, QUALITATIVE NAAT
FLU A: NOT DETECTED
FLU B: NOT DETECTED
SARS CoV2 RNA: DETECTED — AB

## 2020-11-12 DIAGNOSIS — Z20822 Contact with and (suspected) exposure to covid-19: Secondary | ICD-10-CM | POA: Diagnosis not present

## 2020-12-15 ENCOUNTER — Ambulatory Visit (HOSPITAL_COMMUNITY)
Admission: EM | Admit: 2020-12-15 | Discharge: 2020-12-15 | Disposition: A | Discharge status: Home / Self Care | Payer: BC Managed Care – PPO | Attending: Family Medicine | Admitting: Family Medicine

## 2020-12-15 ENCOUNTER — Encounter (HOSPITAL_COMMUNITY): Payer: Self-pay

## 2020-12-15 ENCOUNTER — Other Ambulatory Visit: Payer: Self-pay

## 2020-12-15 DIAGNOSIS — R030 Elevated blood-pressure reading, without diagnosis of hypertension: Secondary | ICD-10-CM | POA: Diagnosis not present

## 2020-12-15 DIAGNOSIS — R519 Headache, unspecified: Secondary | ICD-10-CM | POA: Diagnosis not present

## 2020-12-15 MED ORDER — AMLODIPINE BESYLATE 2.5 MG PO TABS: 2.5000 mg | ORAL_TABLET | Freq: Every day | ORAL | 0 refills | Status: DC

## 2020-12-15 NOTE — ED Triage Notes (Signed)
Pt c/o a migraine and dizziness  X this morning. Pt states her BP was elevated this morning and states she has taken Tylenol to relieve the migraine. She states she does not have HBP problems. She states the headache moves to the back of the neck. She states she felt her face was swelling up today.

## 2020-12-15 NOTE — Discharge Instructions (Addendum)
Followed by the end of the week with your primary care provider.  Start monitoring your home blood pressures closely on this new medicine and stop it right away if you notice low readings or feel you are going to pass out.  Go to the emergency department immediately if starting to have worsening symptoms

## 2020-12-16 ENCOUNTER — Encounter (HOSPITAL_BASED_OUTPATIENT_CLINIC_OR_DEPARTMENT_OTHER): Payer: Self-pay | Admitting: Emergency Medicine

## 2020-12-16 ENCOUNTER — Emergency Department (HOSPITAL_COMMUNITY): Payer: BC Managed Care – PPO

## 2020-12-16 ENCOUNTER — Emergency Department (HOSPITAL_BASED_OUTPATIENT_CLINIC_OR_DEPARTMENT_OTHER): Payer: BC Managed Care – PPO

## 2020-12-16 ENCOUNTER — Emergency Department (HOSPITAL_BASED_OUTPATIENT_CLINIC_OR_DEPARTMENT_OTHER)
Admission: EM | Admit: 2020-12-16 | Discharge: 2020-12-16 | Disposition: A | Discharge status: Home / Self Care | Payer: BC Managed Care – PPO | Attending: Emergency Medicine | Admitting: Emergency Medicine

## 2020-12-16 DIAGNOSIS — H53149 Visual discomfort, unspecified: Secondary | ICD-10-CM | POA: Diagnosis not present

## 2020-12-16 DIAGNOSIS — Z0389 Encounter for observation for other suspected diseases and conditions ruled out: Secondary | ICD-10-CM | POA: Diagnosis not present

## 2020-12-16 DIAGNOSIS — M542 Cervicalgia: Secondary | ICD-10-CM | POA: Insufficient documentation

## 2020-12-16 DIAGNOSIS — R42 Dizziness and giddiness: Secondary | ICD-10-CM | POA: Diagnosis not present

## 2020-12-16 DIAGNOSIS — R519 Headache, unspecified: Secondary | ICD-10-CM | POA: Diagnosis not present

## 2020-12-16 DIAGNOSIS — G43909 Migraine, unspecified, not intractable, without status migrainosus: Secondary | ICD-10-CM | POA: Diagnosis not present

## 2020-12-16 DIAGNOSIS — R110 Nausea: Secondary | ICD-10-CM | POA: Insufficient documentation

## 2020-12-16 DIAGNOSIS — R002 Palpitations: Secondary | ICD-10-CM | POA: Diagnosis not present

## 2020-12-16 DIAGNOSIS — H53129 Transient visual loss, unspecified eye: Secondary | ICD-10-CM | POA: Diagnosis not present

## 2020-12-16 DIAGNOSIS — H539 Unspecified visual disturbance: Secondary | ICD-10-CM

## 2020-12-16 DIAGNOSIS — I6522 Occlusion and stenosis of left carotid artery: Secondary | ICD-10-CM | POA: Diagnosis not present

## 2020-12-16 DIAGNOSIS — Z87891 Personal history of nicotine dependence: Secondary | ICD-10-CM | POA: Diagnosis not present

## 2020-12-16 LAB — CBC WITH DIFFERENTIAL/PLATELET
Abs Immature Granulocytes: 0.01 10*3/uL (ref 0.00–0.07)
Basophils Absolute: 0 10*3/uL (ref 0.0–0.1)
Basophils Relative: 1 %
Eosinophils Absolute: 0.2 10*3/uL (ref 0.0–0.5)
Eosinophils Relative: 3 %
HCT: 42.1 % (ref 36.0–46.0)
Hemoglobin: 13.7 g/dL (ref 12.0–15.0)
Immature Granulocytes: 0 %
Lymphocytes Relative: 41 %
Lymphs Abs: 2.4 10*3/uL (ref 0.7–4.0)
MCH: 28.3 pg (ref 26.0–34.0)
MCHC: 32.5 g/dL (ref 30.0–36.0)
MCV: 87 fL (ref 80.0–100.0)
Monocytes Absolute: 0.5 10*3/uL (ref 0.1–1.0)
Monocytes Relative: 8 %
Neutro Abs: 2.9 10*3/uL (ref 1.7–7.7)
Neutrophils Relative %: 47 %
Platelets: 266 10*3/uL (ref 150–400)
RBC: 4.84 MIL/uL (ref 3.87–5.11)
RDW: 11.6 % (ref 11.5–15.5)
WBC: 6 10*3/uL (ref 4.0–10.5)
nRBC: 0 % (ref 0.0–0.2)

## 2020-12-16 LAB — COMPREHENSIVE METABOLIC PANEL
ALT: 15 U/L (ref 0–44)
AST: 21 U/L (ref 15–41)
Albumin: 4.6 g/dL (ref 3.5–5.0)
Alkaline Phosphatase: 26 U/L — ABNORMAL LOW (ref 38–126)
Anion gap: 6 (ref 5–15)
BUN: 13 mg/dL (ref 6–20)
CO2: 30 mmol/L (ref 22–32)
Calcium: 9.7 mg/dL (ref 8.9–10.3)
Chloride: 102 mmol/L (ref 98–111)
Creatinine, Ser: 0.83 mg/dL (ref 0.44–1.00)
GFR, Estimated: >60 mL/min (ref >60)
Glucose, Bld: 107 mg/dL — ABNORMAL HIGH (ref 70–99)
Potassium: 3.8 mmol/L (ref 3.5–5.1)
Sodium: 138 mmol/L (ref 135–145)
Total Bilirubin: 0.4 mg/dL (ref 0.3–1.2)
Total Protein: 7.9 g/dL (ref 6.5–8.1)

## 2020-12-16 LAB — PROTIME-INR
INR: 0.9 (ref 0.8–1.2)
Prothrombin Time: 12.4 seconds (ref 11.4–15.2)

## 2020-12-16 LAB — MAGNESIUM: Magnesium: 1.9 mg/dL (ref 1.7–2.4)

## 2020-12-16 MED ORDER — GADOBUTROL 1 MMOL/ML IV SOLN
5.9000 mL | Freq: Once | INTRAVENOUS | Status: AC | PRN
  Administered 2020-12-16: 18:00:00 5.9 mL via INTRAVENOUS

## 2020-12-16 MED ORDER — SODIUM CHLORIDE 0.9 % IV BOLUS
1000.0000 mL | Freq: Once | INTRAVENOUS | Status: AC
  Administered 2020-12-16: 09:00:00 1000 mL via INTRAVENOUS

## 2020-12-16 MED ORDER — PROCHLORPERAZINE EDISYLATE 10 MG/2ML IJ SOLN
10.0000 mg | Freq: Once | INTRAMUSCULAR | Status: AC
  Administered 2020-12-16: 09:00:00 10 mg via INTRAVENOUS
  Filled 2020-12-16: qty 2

## 2020-12-16 MED ORDER — IOHEXOL 350 MG/ML SOLN
100.0000 mL | Freq: Once | INTRAVENOUS | Status: AC | PRN
  Administered 2020-12-16: 09:00:00 100 mL via INTRAVENOUS

## 2020-12-16 MED ORDER — DIPHENHYDRAMINE HCL 50 MG/ML IJ SOLN
50.0000 mg | Freq: Once | INTRAMUSCULAR | Status: AC
  Administered 2020-12-16: 09:00:00 50 mg via INTRAVENOUS
  Filled 2020-12-16: qty 1

## 2020-12-16 NOTE — ED Provider Notes (Signed)
[facility] EMERGENCY DEPARTMENT Provider Note   CSN: [id number] Arrival date & time: 12/16/20  8921     History Chief Complaint  Patient presents with  . Headache    Christy Collins is a 50 y.o. female.  The history is provided by the patient and medical records. No language interpreter was used.  Headache Pain location:  Frontal and occipital Quality:  Dull and sharp Radiates to:  L neck and R neck Severity currently:  8/10 Severity at highest:  8/10 Onset quality:  Gradual Duration:  2 weeks Timing:  Intermittent Progression:  Waxing and waning Chronicity:  New Similar to prior headaches: no   Context: bright light   Context comment:  At night and when laying down Relieved by:  Nothing Worsened by:  Activity Ineffective treatments:  None tried Associated symptoms: dizziness, nausea, neck pain, photophobia and visual change   Associated symptoms: no abdominal pain, no back pain, no blurred vision (darkened r sided vision), no congestion, no cough, no diarrhea, no fatigue, no fever, no focal weakness, no loss of balance, no myalgias, no near-syncope, no neck stiffness, no numbness, no paresthesias, no seizures, no sinus pressure, no syncope, no vomiting and no weakness        Past Medical History:  Diagnosis Date  . Achalasia   . Anemia   . Bacterial infection   . Constipation   . Eczema   . GERD (gastroesophageal reflux disease)    Chronic   . Heart murmur    light no follow up  . Hiatal hernia   . History of chicken pox   . Ovarian cyst   . PONV (postoperative nausea and vomiting)   . Stricture and stenosis of esophagus   . Vitiligo   . Yeast infection     Patient Active Problem List   Diagnosis Date Noted  . Menorrhagia 06/20/2019  . Eczema 04/19/2018  . Hiatal hernia 04/19/2018  . Uterine leiomyoma 04/19/2018  . Vitiligo   . ANXIETY 04/29/2008  . ACHALASIA s/p laparoscopic Heller myotomy and Dor Fundoplication 19/41/7408  . ESOPHAGEAL  STRICTURE 04/24/2008  . GERD 04/24/2008  . CONSTIPATION 04/24/2008    Past Surgical History:  Procedure Laterality Date  . ABDOMINAL HYSTERECTOMY    . BREAST LUMPECTOMY    . DILATION AND CURETTAGE OF UTERUS    . DILITATION & CURRETTAGE/HYSTROSCOPY WITH VERSAPOINT RESECTION  06/08/2012   Procedure: DILATATION & CURETTAGE/HYSTEROSCOPY WITH VERSAPOINT RESECTION;  Surgeon: Alwyn Pea, MD;  Location: [facility];  Service: Gynecology;  Laterality: N/A;  and novasure  . ESOPHAGEAL MANOMETRY N/A 09/17/2012   Procedure: ESOPHAGEAL MANOMETRY (EM);  Surgeon: Sable Feil, MD;  Location: [facility];  Service: Endoscopy;  Laterality: N/A;  . ESOPHAGOGASTRODUODENOSCOPY N/A 11/05/2012   Procedure: ESOPHAGOGASTRODUODENOSCOPY (EGD);  Surgeon: Odis Hollingshead, MD;  Location: [facility];  Service: General;  Laterality: N/A;  . HELLER MYOTOMY N/A 11/05/2012   Procedure: LAPAROSCOPIC ESOPHAGEAL MYOTOMY AND ANTERIOR FUNDOPLICATION;  Surgeon: Odis Hollingshead, MD;  Location: [facility];  Service: General;  Laterality: N/A;  . ROBOTIC ASSISTED TOTAL HYSTERECTOMY N/A 06/20/2019   Procedure: XI ROBOTIC ASSISTED TOTAL HYSTERECTOMY. BILATERAL SALPINGECTOMY;  Surgeon: Delsa Bern, MD;  Location: [facility];  Service: Gynecology;  Laterality: N/A;  . TONSILLECTOMY    . TUBAL LIGATION    . UTERINE FIBROID SURGERY       OB History    Gravida  4   Para  3   Term  Preterm      AB  1   Living  3     SAB      IAB      Ectopic      Multiple      Live Births  3           Family History  Problem Relation Age of Onset  . Asthma Mother   . Hyperlipidemia Mother   . Alcohol abuse Father   . Hypertension Father   . Asthma Daughter   . Cancer Maternal Grandmother   . Cancer Paternal Uncle   . Hypertension Paternal Uncle   . Hypertension Paternal Aunt   . Diabetes Paternal Uncle   . Diabetes Paternal Aunt   . Rheumatic fever Daughter     Social History   Tobacco Use   . Smoking status: Former Smoker    Packs/day: 0.50    Types: Cigarettes    Quit date: 11/05/2012    Years since quitting: 8.1  . Smokeless tobacco: Never Used  Vaping Use  . Vaping Use: Never used  Substance Use Topics  . Alcohol use: Yes    Comment: occasionally  . Drug use: No    Home Medications Prior to Admission medications   Medication Sig Start Date End Date Taking? Authorizing Provider  amLODipine (NORVASC) 2.5 MG tablet Take 1 tablet (2.5 mg total) by mouth daily. 12/15/20   Volney American, PA-C  Black Cohosh 40 MG CAPS Take 80 mg by mouth daily.    [provider]  cyclobenzaprine (FLEXERIL) 10 MG tablet Take 1 tablet (10 mg total) by mouth 3 (three) times daily as needed for muscle spasms. Patient not taking: Reported on 07/07/2020 10/28/19   Susy Frizzle, MD  EVENING PRIMROSE OIL PO Take by mouth.    [provider]  Multiple Vitamins-Minerals (MULTIPLE VITAMINS/WOMENS) tablet Take 1 tablet by mouth daily.    [provider]  naproxen (NAPROSYN) 500 MG tablet Take 1 tablet (500 mg total) by mouth 2 (two) times daily. 06/20/20   Peri Jefferson, PA-C  Probiotic Product (PROBIOTIC PO) Take by mouth.    [provider]    Allergies    Other  Review of Systems   Review of Systems  Constitutional: Negative for chills, diaphoresis, fatigue and fever.  HENT: Negative for congestion and sinus pressure.   Eyes: Positive for photophobia and visual disturbance. Negative for blurred vision (darkened r sided vision).  Respiratory: Negative for cough, chest tightness, shortness of breath and wheezing.   Cardiovascular: Negative for chest pain, palpitations, leg swelling, syncope and near-syncope.  Gastrointestinal: Positive for nausea. Negative for abdominal pain, diarrhea and vomiting.  Genitourinary: Negative for flank pain.  Musculoskeletal: Positive for neck pain. Negative for back pain, myalgias and neck stiffness.   Neurological: Positive for dizziness, light-headedness and headaches. Negative for focal weakness, seizures, syncope, weakness, numbness, paresthesias and loss of balance.  Psychiatric/Behavioral: Negative for agitation and confusion.  All other systems reviewed and are negative.   Physical Exam Updated Vital Signs BP (!) 135/92 (BP Location: Right Arm)   Pulse 62   Temp 98.2 F (36.8 C)   LMP 06/20/2019   SpO2 98%   Physical Exam Vitals and nursing note reviewed.  Constitutional:      General: She is not in acute distress.    Appearance: She is well-developed. She is not ill-appearing, toxic-appearing or diaphoretic.  HENT:     Head: Normocephalic and atraumatic.  Mouth/Throat:     Mouth: Mucous membranes are moist.  Eyes:     General: No visual field deficit.    Extraocular Movements: Extraocular movements intact.     Right eye: Normal extraocular motion.     Left eye: Normal extraocular motion.     Conjunctiva/sclera: Conjunctivae normal.     Pupils: Pupils are equal, round, and reactive to light. Pupils are equal.  Cardiovascular:     Rate and Rhythm: Normal rate and regular rhythm.     Heart sounds: No murmur heard.   Pulmonary:     Effort: Pulmonary effort is normal. No respiratory distress.     Breath sounds: Normal breath sounds. No wheezing, rhonchi or rales.  Chest:     Chest wall: No tenderness.  Abdominal:     Palpations: Abdomen is soft.     Tenderness: There is no abdominal tenderness.  Musculoskeletal:        General: No tenderness.     Cervical back: Normal range of motion and neck supple. No rigidity.  Skin:    General: Skin is warm and dry.     Capillary Refill: Capillary refill takes less than 2 seconds.  Neurological:     Mental Status: She is alert and oriented to person, place, and time. Mental status is at baseline.     Cranial Nerves: No cranial nerve deficit, dysarthria or facial asymmetry.     Sensory: No sensory deficit.     Motor:  No weakness.     Coordination: Romberg sign negative. Coordination normal.  Psychiatric:        Mood and Affect: Mood normal. Mood is not anxious.     ED Results / Procedures / Treatments   Labs (all labs ordered are listed, but only abnormal results are displayed) Labs Reviewed  COMPREHENSIVE METABOLIC PANEL - Abnormal; Notable for the following components:      Result Value   Glucose, Bld 107 (*)    Alkaline Phosphatase 26 (*)    All other components within normal limits  CBC WITH DIFFERENTIAL/PLATELET  PROTIME-INR  MAGNESIUM    EKG EKG Interpretation  Date/Time:  Wednesday December 16 2020 08:27:48 EDT Ventricular Rate:  62 PR Interval:  135 QRS Duration: 95 QT Interval:  393 QTC Calculation: 399 R Axis:   28 Text Interpretation: Sinus rhythm when compared to prior, similar appearance. No sTEMI Confirmed by Antony Blackbird [phone] on 12/16/2020 8:44:10 AM   Radiology CT Angio Head W or Wo Contrast  Result Date: 12/16/2020 CLINICAL DATA:  Vertebral artery dissection suspected. EXAM: CT ANGIOGRAPHY HEAD AND NECK TECHNIQUE: Multidetector CT imaging of the head and neck was performed using the standard protocol during bolus administration of intravenous contrast. Multiplanar CT image reconstructions and MIPs were obtained to evaluate the vascular anatomy. Carotid stenosis measurements (when applicable) are obtained utilizing NASCET criteria, using the distal internal carotid diameter as the denominator. CONTRAST:  125mL OMNIPAQUE IOHEXOL 350 MG/ML SOLN COMPARISON:  None. FINDINGS: CT HEAD FINDINGS Brain: No evidence of acute large vascular territory infarction, hemorrhage, hydrocephalus, extra-axial collection or mass lesion/mass effect. Vascular: See below Skull: No acute fracture. Sinuses: Visualized sinuses are clear. Orbits: Unremarkable. Review of the MIP images confirms the above findings CTA NECK FINDINGS Aortic arch: Great vessel origins are patent. Right carotid system: No  evidence of dissection, stenosis (50% or greater) or occlusion. Left carotid system: No evidence of dissection, stenosis (50% or greater) or occlusion. Mild atherosclerotic narrowing/irregularity of the proximal internal carotid artery. Vertebral arteries:  Mildly left dominant. No evidence of dissection, stenosis (50% or greater) or occlusion. Skeleton: Multilevel degenerative disc disease with mild disc height loss and partially calcified disc bulges at multiple levels. Other neck: No acute abnormality Upper chest: Emphysema with biapical pleuroparenchymal scarring. Otherwise, visualized lung apices are clear. Patulous esophagus with fluid layering in the inferior esophagus. Review of the MIP images confirms the above findings CTA HEAD FINDINGS Venous contamination mildly limits evaluation. Anterior circulation: No large vessel occlusion, proximal hemodynamically significant stenosis, or visible aneurysm. Posterior circulation: Left dominant intradural vertebral artery. Bilateral intradural vertebral arteries, basilar artery and posterior cerebral arteries are patent without proximal hemodynamically significant stenosis. Evaluation of the distal PCAs is limited by venous contamination. No visible aneurysm. Venous sinuses: As permitted by contrast timing, patent. Review of the MIP images confirms the above findings IMPRESSION: 1. No evidence of acute intracranial abnormality. 2. No evidence of acute arterial injury, large vessel occlusion, or proximal hemodynamically significant stenosis. 3. Emphysema. 4. Patulous esophagus with fluid layering in the inferior esophagus. Electronically Signed   By: Margaretha Sheffield MD   On: 12/16/2020 09:59   DG Chest 2 View  Result Date: 12/16/2020 CLINICAL DATA:  Palpitations EXAM: CHEST - 2 VIEW COMPARISON:  06/20/2020 FINDINGS: The heart size and mediastinal contours are within normal limits. Both lungs are clear. The visualized skeletal structures are unremarkable.  IMPRESSION: No active cardiopulmonary disease. Electronically Signed   By: Rolm Baptise M.D.   On: 12/16/2020 10:15   CT Angio Neck W and/or Wo Contrast  Result Date: 12/16/2020 CLINICAL DATA:  Vertebral artery dissection suspected. EXAM: CT ANGIOGRAPHY HEAD AND NECK TECHNIQUE: Multidetector CT imaging of the head and neck was performed using the standard protocol during bolus administration of intravenous contrast. Multiplanar CT image reconstructions and MIPs were obtained to evaluate the vascular anatomy. Carotid stenosis measurements (when applicable) are obtained utilizing NASCET criteria, using the distal internal carotid diameter as the denominator. CONTRAST:  143mL OMNIPAQUE IOHEXOL 350 MG/ML SOLN COMPARISON:  None. FINDINGS: CT HEAD FINDINGS Brain: No evidence of acute large vascular territory infarction, hemorrhage, hydrocephalus, extra-axial collection or mass lesion/mass effect. Vascular: See below Skull: No acute fracture. Sinuses: Visualized sinuses are clear. Orbits: Unremarkable. Review of the MIP images confirms the above findings CTA NECK FINDINGS Aortic arch: Great vessel origins are patent. Right carotid system: No evidence of dissection, stenosis (50% or greater) or occlusion. Left carotid system: No evidence of dissection, stenosis (50% or greater) or occlusion. Mild atherosclerotic narrowing/irregularity of the proximal internal carotid artery. Vertebral arteries: Mildly left dominant. No evidence of dissection, stenosis (50% or greater) or occlusion. Skeleton: Multilevel degenerative disc disease with mild disc height loss and partially calcified disc bulges at multiple levels. Other neck: No acute abnormality Upper chest: Emphysema with biapical pleuroparenchymal scarring. Otherwise, visualized lung apices are clear. Patulous esophagus with fluid layering in the inferior esophagus. Review of the MIP images confirms the above findings CTA HEAD FINDINGS Venous contamination mildly limits  evaluation. Anterior circulation: No large vessel occlusion, proximal hemodynamically significant stenosis, or visible aneurysm. Posterior circulation: Left dominant intradural vertebral artery. Bilateral intradural vertebral arteries, basilar artery and posterior cerebral arteries are patent without proximal hemodynamically significant stenosis. Evaluation of the distal PCAs is limited by venous contamination. No visible aneurysm. Venous sinuses: As permitted by contrast timing, patent. Review of the MIP images confirms the above findings IMPRESSION: 1. No evidence of acute intracranial abnormality. 2. No evidence of acute arterial injury, large vessel occlusion, or proximal hemodynamically significant  stenosis. 3. Emphysema. 4. Patulous esophagus with fluid layering in the inferior esophagus. Electronically Signed   By: Margaretha Sheffield MD   On: 12/16/2020 09:59    Procedures Procedures   Medications Ordered in ED Medications  sodium chloride 0.9 % bolus 1,000 mL (0 mLs Intravenous Stopped 12/16/20 1020)  prochlorperazine (COMPAZINE) injection 10 mg (10 mg Intravenous Given 12/16/20 0917)  diphenhydrAMINE (BENADRYL) injection 50 mg (50 mg Intravenous Given 12/16/20 0917)  iohexol (OMNIPAQUE) 350 MG/ML injection 100 mL (100 mLs Intravenous Contrast Given 12/16/20 3790)    ED Course  I have reviewed the triage vital signs and the nursing notes.  Pertinent labs & imaging results that were available during my care of the patient were reviewed by me and considered in my medical decision making (see chart for details).    MDM Rules/Calculators/A&P                          KEIANDRA SULLENGER is a very pleasant 50 y.o. female with a past medical history significant for previous achalasia and esophageal stricture, anxiety, GERD, and vitiligo who presents with 2 weeks of severe nocturnal headaches, intermittent right-sided vision loss, nausea, vomiting, dizziness, lightheadedness, intermittent facial swelling,  palpitations, and "feeling like something is wrong".  She also reports her blood pressures been elevated over the last week up into the 150s and 160s reportedly.  She was started on amlodipine yesterday at an urgent care.  She reports that with her symptoms persistent today, she was told to come into the emergency department for evaluation and management.  Patient reports that she has not someone who has had migrainous type headaches in the past but she is having some photophobia.  She reports her headaches are almost every night when she is laying down at night and then gets better throughout the day.  She reports he gets extremely severe and is both in her head and down into her neck.  She reports no fevers or chills, cough, or congestion.  She denies chest pain or shortness of breath.  She does report occasional palpitations and says that at times her face seems very swollen intermittently.  She did not have any documentation supporting or describe any history of a mediastinal mass or thoracic outlet or SVC syndrome.  She denies any recent trauma.  She  does not report a history of meningitis or other intracranial infections.  She reports her right vision has been getting like a black patch over the right side of her vision which she thinks could be either just her right eye or both eyes intermittently.  She is not having it now.  She reports no double vision.  She denies any difficulty with speech, numbness, tingling, weakness of extremities.  No history of this.  On exam, lungs are clear and chest is nontender.  Abdomen is nontender.  Intact pulses, strength, and sensation in extremities.  Normal finger-nose-finger testing bilaterally.  Normal object naming.  Symmetric smile.  Pupils symmetric and reactive normal extraocular movements.  Mild paraspinal tenderness on exam of the neck.  No focal nuchal rigidity or stiffness.  EKG shows no STEMI.  Clinically I am concerned although this may be a  complicated migraine causing some of the symptoms with the photophobia, with the nocturnal predominance of the headaches with laying down as well as some of the vision changes, dizziness, and symptoms in the neck with elevated blood pressures, we will get some CTAs of  the head and neck to look for dissection with high blood pressure neck pain, and we will also get the CT of the head to look for some intracranial abnormality.  We will give her headache cocktail to see if this improves all of her symptoms and get a chest x-ray to rule out some large mediastinal mass or other compressive thing that could be causing increased pressure and swelling of the face and head.  Anticipate touching base with neurology to discuss further management after work-up is completed.  10:29 AM CTA of the head and neck returned showing no acute abnormality such as dissection or intracranial abnormality.  Chest x-ray reassuring.  Labs overall reassuring.  Spoke with neurology who reviewed the case.  He suspects that a dural venous sinus thrombosis may have been seen on the CTA so he has a low suspicion for that but he does feel that given her neurologic symptoms with the vision changes and dizziness and her age, he does recommend getting an MRI brain with and without contrast as well as an MRV with and without contrast.  Patient will need transfer ED to ED to [facility] today to get these MRIs.  We will discussed with patient about this plan.  If her symptoms are completely resolved, she may be able to follow-up as PCP and get this done as an outpatient he does feel that she will likely need to follow-up with outpatient ophthalmology for evaluation given the vision changes as well.  Anticipate reassessment.  1:44 PM CT scan returned showing no evidence of acute intracranial abnormality.  Patient reports her headaches are doing better but she does want to get the MRV and MRI both with and without contrast today to rule out MS,  tumors, stroke, TIA, dural venous sinus thrombosis, or other acute abnormality.  Patient will be transferred and driven by her husband for the MRIs tonight.  If work-up is reassuring, anticipate discharge home with plans to follow-up with outpatient ophthalmology as recommended by neurology.  Patient accepted by Dr. Aletta Edouard at [facility] emergency department for MRIs and reevaluation.  Care transferred in stable condition.  Final Clinical Impression(s) / ED Diagnoses Final diagnoses:  Worsening headaches  Transient vision disturbance  Dizziness    Clinical Impression: 1. Worsening headaches   2. Transient vision disturbance   3. Dizziness     Disposition: Care transferred to [facility] emergency department for MRIs and reevaluation.  Anticipate discharge home with plans to follow-up with ophthalmology if work-up is reassuring.  If there is evidence of stroke or other acute abnormality, anticipate consultation with neurology for further recommendations.  This note was prepared with assistance of Systems analyst. Occasional wrong-word or sound-a-like substitutions may have occurred due to the inherent limitations of voice recognition software.     Branson Kranz, Gwenyth Allegra, MD 12/16/20 [phone]

## 2020-12-16 NOTE — ED Provider Notes (Signed)
Patient transferred here from [facility] where she was originally evaluated by Dr. Sherry Ruffing for headache.  Had been transferred to obtain MRI/MRV per neurology recommendations.   MRI, MRV negative acute.  Patient is well-appearing and comfortable, very pleasant on exam and wants to go home.  I have discussed imaging results and need to follow-up with PCP.  She notes some fluctuating blood pressures and has wondered if this is contributing to her symptoms.  Encouraged to discuss with PCP.  Patient also encouraged to see ophthalmologist as an outpatient.  Return precautions reviewed and she voiced understanding.   Pema Thomure, Wenda Overland, MD 12/16/20 [phone]

## 2020-12-16 NOTE — ED Notes (Signed)
Report called to charge nurse at [facility] ED. 

## 2020-12-16 NOTE — ED Notes (Signed)
Patient transported to MRI 

## 2020-12-16 NOTE — ED Notes (Signed)
Alert, NAD, calm, interactive, reps e/u, speech clear. Denies symptom or complaints other than mild improved HA. Wants IV out. Believes she can go home. Imaging results noted.

## 2020-12-16 NOTE — ED Notes (Signed)
Arrives from MRI to [facility]

## 2020-12-16 NOTE — ED Triage Notes (Addendum)
Seen at [facility] yesterday for same for h/a and not feel ing well x 1 week , states vomited last night  , has multiple complaints  and feels like she is having heart  Palpitations, given meds yesterday and she took  Them but is no better

## 2020-12-16 NOTE — ED Notes (Signed)
Not present at this time.

## 2020-12-19 NOTE — ED Provider Notes (Signed)
[facility]    CSN: [id number] Arrival date & time: 12/15/20  1524      History   Chief Complaint Chief Complaint  Patient presents with   Hypertension    HPI Christy Collins is a 50 y.o. female.   Patient presenting today with almost a week history of headache that moves from posterior neck up into scalp and just not feeling like herself. Some lightheadedness off and on, not currently. She states she checked her BP this morning and it was elevated to around 150/100-110 which is abnormal for her. She is not on any chronic medications and tries to live a healthy lifestyle using homeopathic remedies so has been trying ginger teas, vitamins, and other natural remedies for high blood pressure the past 24 hours. Notes the BP has come down to normal range the past few hours. Denies mental status changes, speech or swallowing issues, extremity or facial numbness or weakness, syncope, CP, SOB, recent head trauma. No past hx of CVA, cardiac conditions, HTN, or other significant chronic condition per patient. Has had migraine headaches in the past and thinks this may be one again.    Past Medical History:  Diagnosis Date   Achalasia    Anemia    Bacterial infection    Constipation    Eczema    GERD (gastroesophageal reflux disease)    Chronic    Heart murmur    light no follow up   Hiatal hernia    History of chicken pox    Ovarian cyst    PONV (postoperative nausea and vomiting)    Stricture and stenosis of esophagus    Vitiligo    Yeast infection     Patient Active Problem List   Diagnosis Date Noted   Menorrhagia 06/20/2019   Eczema 04/19/2018   Hiatal hernia 04/19/2018   Uterine leiomyoma 04/19/2018   Vitiligo    ANXIETY 04/29/2008   ACHALASIA s/p laparoscopic Heller myotomy and Dor Fundoplication 16/01/3709   ESOPHAGEAL STRICTURE 04/24/2008   GERD 04/24/2008   CONSTIPATION 04/24/2008    Past Surgical History:  Procedure Laterality Date   ABDOMINAL  HYSTERECTOMY     BREAST LUMPECTOMY     DILATION AND CURETTAGE OF UTERUS     DILITATION & CURRETTAGE/HYSTROSCOPY WITH VERSAPOINT RESECTION  06/08/2012   Procedure: DILATATION & CURETTAGE/HYSTEROSCOPY WITH VERSAPOINT RESECTION;  Surgeon: Alwyn Pea, MD;  Location: [facility];  Service: Gynecology;  Laterality: N/A;  and novasure   ESOPHAGEAL MANOMETRY N/A 09/17/2012   Procedure: ESOPHAGEAL MANOMETRY (EM);  Surgeon: Sable Feil, MD;  Location: [facility];  Service: Endoscopy;  Laterality: N/A;   ESOPHAGOGASTRODUODENOSCOPY N/A 11/05/2012   Procedure: ESOPHAGOGASTRODUODENOSCOPY (EGD);  Surgeon: Odis Hollingshead, MD;  Location: [facility];  Service: General;  Laterality: N/A;   HELLER MYOTOMY N/A 11/05/2012   Procedure: LAPAROSCOPIC ESOPHAGEAL MYOTOMY AND ANTERIOR FUNDOPLICATION;  Surgeon: Odis Hollingshead, MD;  Location: [facility];  Service: General;  Laterality: N/A;   ROBOTIC ASSISTED TOTAL HYSTERECTOMY N/A 06/20/2019   Procedure: XI ROBOTIC ASSISTED TOTAL HYSTERECTOMY. BILATERAL SALPINGECTOMY;  Surgeon: Delsa Bern, MD;  Location: [facility];  Service: Gynecology;  Laterality: N/A;   TONSILLECTOMY     TUBAL LIGATION     UTERINE FIBROID SURGERY      OB History     Gravida  4   Para  3   Term      Preterm      AB  1   Living  3  SAB      IAB      Ectopic      Multiple      Live Births  3            Home Medications    Prior to Admission medications   Medication Sig Start Date End Date Taking? Authorizing Provider  amLODipine (NORVASC) 2.5 MG tablet Take 1 tablet (2.5 mg total) by mouth daily. 12/15/20  Yes Volney American, PA-C  Black Cohosh 40 MG CAPS Take 80 mg by mouth daily.    [provider]  cyclobenzaprine (FLEXERIL) 10 MG tablet Take 1 tablet (10 mg total) by mouth 3 (three) times daily as needed for muscle spasms. Patient not taking: Reported on 07/07/2020 10/28/19   Susy Frizzle, MD  EVENING PRIMROSE OIL PO  Take by mouth.    [provider]  Multiple Vitamins-Minerals (MULTIPLE VITAMINS/WOMENS) tablet Take 1 tablet by mouth daily.    [provider]  naproxen (NAPROSYN) 500 MG tablet Take 1 tablet (500 mg total) by mouth 2 (two) times daily. 06/20/20   Peri Jefferson, PA-C  Probiotic Product (PROBIOTIC PO) Take by mouth.    [provider]    Family History Family History  Problem Relation Age of Onset   Asthma Mother    Hyperlipidemia Mother    Alcohol abuse Father    Hypertension Father    Asthma Daughter    Cancer Maternal Grandmother    Cancer Paternal Uncle    Hypertension Paternal Uncle    Hypertension Paternal Aunt    Diabetes Paternal Uncle    Diabetes Paternal Aunt    Rheumatic fever Daughter     Social History Social History   Tobacco Use   Smoking status: Former    Packs/day: 0.50    Pack years: 0.00    Types: Cigarettes    Quit date: 11/05/2012    Years since quitting: 8.1   Smokeless tobacco: Never  Vaping Use   Vaping Use: Never used  Substance Use Topics   Alcohol use: Yes    Comment: occasionally   Drug use: No     Allergies   Other   Review of Systems Review of Systems PER HPI    Physical Exam Triage Vital Signs ED Triage Vitals  Enc Vitals Group     BP 12/15/20 1553 117/75     Pulse Rate 12/15/20 1553 67     Resp 12/15/20 1553 18     Temp 12/15/20 1553 98.5 F (36.9 C)     Temp Source 12/15/20 1553 Oral     SpO2 12/15/20 1553 100 %     Weight --      Height --      Head Circumference --      Peak Flow --      Pain Score 12/15/20 1550 4     Pain Loc --      Pain Edu? --      Excl. in [place]? --    No data found.  Updated Vital Signs BP 117/75 (BP Location: Right Arm)   Pulse 67   Temp 98.5 F (36.9 C) (Oral)   Resp 18   LMP 06/20/2019   SpO2 100%   Visual Acuity Right Eye Distance:   Left Eye Distance:   Bilateral Distance:    Right Eye Near:   Left Eye Near:    Bilateral Near:      Physical Exam Vitals and nursing note reviewed.  Constitutional:      Appearance: Normal appearance. She is not ill-appearing.  HENT:     Head: Atraumatic.     Mouth/Throat:     Mouth: Mucous membranes are moist.  Eyes:     Extraocular Movements: Extraocular movements intact.     Conjunctiva/sclera: Conjunctivae normal.     Pupils: Pupils are equal, round, and reactive to light.  Cardiovascular:     Rate and Rhythm: Normal rate and regular rhythm.     Heart sounds: Normal heart sounds.  Pulmonary:     Effort: Pulmonary effort is normal.     Breath sounds: Normal breath sounds. No wheezing or rales.  Musculoskeletal:        General: Normal range of motion.     Cervical back: Normal range of motion and neck supple.  Skin:    General: Skin is warm and dry.     Findings: No rash.  Neurological:     General: No focal deficit present.     Mental Status: She is alert and oriented to person, place, and time.     Motor: No weakness.     Gait: Gait normal.  Psychiatric:        Mood and Affect: Mood normal.        Thought Content: Thought content normal.        Judgment: Judgment normal.     UC Treatments / Results  Labs (all labs ordered are listed, but only abnormal results are displayed) Labs Reviewed - No data to display  EKG   Radiology No results found.  Procedures Procedures (including critical care time)  Medications Ordered in UC Medications - No data to display  Initial Impression / Assessment and Plan / UC Course  I have reviewed the triage vital signs and the nursing notes.  Pertinent labs & imaging results that were available during my care of the patient were reviewed by me and considered in my medical decision making (see chart for details).     Given the new onset and unprovoked nature of her headache, lightheadedness and elevated BP strong recommendation made to go directly to ED for further evaluation, particularly r/o of CVA. She declines this  at this time after significant discussion regarding risks of not going, to include death. She is most concerned about her elevated BP at this time which has not resolved with natural remedies. Of note, vital signs all WNL today from triage. She is hoping to start on a medication for this and then f/u with her PCP in the next 2 days or so. Discussed at length the risks of starting BP medication when elevations are infrequent to include causing hypotension/syncope. Ultimately agreed upon sending several tabs of amlodipine 2.5 mg for if readings elevate again prior to f/u with PCP. Discussed signs and sxs to watch for signaling her BPs may be dipping too low. She is aware that if sxs persist or worsen at any point she needs to go directly to the ED. She again declines going to ED immediately, both EMS and private vehicle.   Final Clinical Impressions(s) / UC Diagnoses   Final diagnoses:  Elevated blood pressure reading  Acute nonintractable headache, unspecified headache type     Discharge Instructions      Followed by the end of the week with your primary care provider.  Start monitoring your home blood pressures closely on this new medicine and stop it right away if you notice low readings or feel you are going  to pass out.  Go to the emergency department immediately if starting to have worsening symptoms      ED Prescriptions     Medication Sig Dispense Auth. Provider   amLODipine (NORVASC) 2.5 MG tablet Take 1 tablet (2.5 mg total) by mouth daily. 7 tablet Volney American, [place]      PDMP not reviewed this encounter.   Merrie Roof [place], [place] 12/19/20 [phone]

## 2020-12-23 ENCOUNTER — Other Ambulatory Visit: Payer: Self-pay

## 2020-12-23 ENCOUNTER — Ambulatory Visit (INDEPENDENT_AMBULATORY_CARE_PROVIDER_SITE_OTHER): Payer: BC Managed Care – PPO | Admitting: Nurse Practitioner

## 2020-12-23 ENCOUNTER — Encounter: Payer: Self-pay | Admitting: Nurse Practitioner

## 2020-12-23 VITALS — BP 118/78 | HR 100 | Temp 98.4°F | Ht 63.0 in | Wt 124.6 lb

## 2020-12-23 DIAGNOSIS — Z1211 Encounter for screening for malignant neoplasm of colon: Secondary | ICD-10-CM

## 2020-12-23 DIAGNOSIS — I10 Essential (primary) hypertension: Secondary | ICD-10-CM

## 2020-12-23 MED ORDER — AMLODIPINE BESYLATE 2.5 MG PO TABS: 2.5000 mg | ORAL_TABLET | Freq: Every day | ORAL | 1 refills | Status: DC

## 2020-12-24 LAB — COMPLETE METABOLIC PANEL WITH GFR
AG Ratio: 1.8 (calc) (ref 1.0–2.5)
ALT: 13 U/L (ref 6–29)
AST: 16 U/L (ref 10–35)
Albumin: 4.9 g/dL (ref 3.6–5.1)
Alkaline phosphatase (APISO): 29 U/L — ABNORMAL LOW (ref 31–125)
BUN: 14 mg/dL (ref 7–25)
CO2: 30 mmol/L (ref 20–32)
Calcium: 10 mg/dL (ref 8.6–10.2)
Chloride: 102 mmol/L (ref 98–110)
Creat: 0.95 mg/dL (ref 0.50–1.10)
GFR, Est African American: 82 mL/min/{1.73_m2} (ref >=60)
GFR, Est Non African American: 70 mL/min/{1.73_m2} (ref >=60)
Globulin: 2.8 g/dL (calc) (ref 1.9–3.7)
Glucose, Bld: 94 mg/dL (ref 65–99)
Potassium: 3.9 mmol/L (ref 3.5–5.3)
Sodium: 139 mmol/L (ref 135–146)
Total Bilirubin: 0.3 mg/dL (ref 0.2–1.2)
Total Protein: 7.7 g/dL (ref 6.1–8.1)

## 2020-12-24 NOTE — Assessment & Plan Note (Addendum)
Chronic.  Blood pressure well controlled on amlodipine 2.5 mg daily.  We will continue this medication for now.  We will check electrolytes with kidney function and liver enzymes today.  Instructed on DASH diet and physical activity-goal for physical activity 30 minutes 5 times weekly.  Patient requesting referral to ophthalmologist to check eyes thoroughly-we will place today per request.  Follow-up 6 months.

## 2020-12-24 NOTE — Progress Notes (Signed)
Subjective:    Patient ID: Christy Collins, female    DOB: [birth year], 50 y.o.   MRN: [id number]  HPI: Christy Collins is a 50 y.o. female presenting for high blood pressure.  Chief Complaint  Patient presents with   Hypertension    Finding that the diastolic number is high since covid in 2021 bottom number has been in 100's top number 130's, having swelling in the ankles and feels like she floating when number are high. Went to er, given 7 day supply of amlodipine. 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  Will make app for pap      HYPERTENSION Patient reports recently, her blood pressure at home was running in the 130s/100s.  She started getting headaches and vision changes and overall feeling poorly and went to the emergency room where she was started on amlodipine 2.5 mg about 1 month ago.  She has been taking the medication since then and reports her blood pressure has been much improved.  She is requesting a refill today.  Her symptoms have since gone away. Hypertension status: uncontrolled  Satisfied with current treatment? yes Duration of hypertension: months BP monitoring frequency:  daily BP range: 110s/70s BP medication side effects:  no Medication compliance: Aspirin: no Recurrent headaches: no Visual changes: no Palpitations: no Dyspnea: no Chest pain: no Lower extremity edema: no Dizzy/lightheaded: no  Allergies  Allergen Reactions   Other     Patient states that she cant take any pain meds and if she is put to sleep it makes her sick.    Outpatient Encounter Medications as of 12/23/2020  Medication Sig   Black Cohosh 40 MG CAPS Take 80 mg by mouth daily.   EVENING PRIMROSE OIL PO Take by mouth.   Multiple Vitamins-Minerals (MULTIPLE VITAMINS/WOMENS) tablet Take 1 tablet by mouth daily.   Probiotic Product (PROBIOTIC PO) Take by mouth.   [DISCONTINUED] amLODipine (NORVASC) 2.5 MG tablet Take 1 tablet (2.5 mg total) by mouth daily.   amLODipine  (NORVASC) 2.5 MG tablet Take 1 tablet (2.5 mg total) by mouth daily.   [DISCONTINUED] cyclobenzaprine (FLEXERIL) 10 MG tablet Take 1 tablet (10 mg total) by mouth 3 (three) times daily as needed for muscle spasms. (Patient not taking: Reported on 07/07/2020)   [DISCONTINUED] naproxen (NAPROSYN) 500 MG tablet Take 1 tablet (500 mg total) by mouth 2 (two) times daily.   No facility-administered encounter medications on file as of 12/23/2020.    Patient Active Problem List   Diagnosis Date Noted   Essential hypertension 12/23/2020   Menorrhagia 06/20/2019   Eczema 04/19/2018   Hiatal hernia 04/19/2018   Uterine leiomyoma 04/19/2018   Vitiligo    ANXIETY 04/29/2008   ACHALASIA s/p laparoscopic Heller myotomy and Dor Fundoplication 67/06/4579   ESOPHAGEAL STRICTURE 04/24/2008   GERD 04/24/2008   CONSTIPATION 04/24/2008    Past Medical History:  Diagnosis Date   Achalasia    Anemia    Bacterial infection    Constipation    Eczema    GERD (gastroesophageal reflux disease)    Chronic    Heart murmur    light no follow up   Hiatal hernia    History of chicken pox    Ovarian cyst    PONV (postoperative nausea and vomiting)    Stricture and stenosis of esophagus    Vitiligo    Yeast infection     Relevant past medical, surgical, family and social history reviewed and updated as indicated.  Interim medical history since our last visit reviewed.  Review of Systems Per HPI unless specifically indicated above     Objective:    BP 118/78   Pulse 100   Temp 98.4 F (36.9 C)   Ht 5\' 3"  (1.6 m)   Wt 124 lb 9.6 oz (56.5 kg)   LMP 06/20/2019   SpO2 97%   BMI 22.07 kg/m   Wt Readings from Last 3 Encounters:  12/23/20 124 lb 9.6 oz (56.5 kg)  07/07/20 130 lb (59 kg)  10/28/19 124 lb (56.2 kg)    Physical Exam Vitals and nursing note reviewed.  Constitutional:      General: She is not in acute distress.    Appearance: Normal appearance. She is not toxic-appearing.  Eyes:      General: No scleral icterus.    Extraocular Movements: Extraocular movements intact.     Pupils: Pupils are equal, round, and reactive to light.  Neck:     Vascular: No carotid bruit.  Cardiovascular:     Rate and Rhythm: Normal rate and regular rhythm.     Heart sounds: Normal heart sounds. No murmur heard. Pulmonary:     Effort: Pulmonary effort is normal. No respiratory distress.     Breath sounds: Normal breath sounds. No wheezing or rhonchi.  Musculoskeletal:        General: Normal range of motion.     Cervical back: Normal range of motion.     Right lower leg: No edema.     Left lower leg: No edema.  Lymphadenopathy:     Cervical: No cervical adenopathy.  Skin:    General: Skin is warm and dry.     Coloration: Skin is not jaundiced or pale.     Findings: No erythema.  Neurological:     General: No focal deficit present.     Mental Status: She is alert and oriented to person, place, and time.     Motor: No weakness.     Gait: Gait normal.  Psychiatric:        Mood and Affect: Mood normal.        Behavior: Behavior normal.        Thought Content: Thought content normal.        Judgment: Judgment normal.      Assessment & Plan:   Problem List Items Addressed This Visit       Cardiovascular and Mediastinum   Essential hypertension - Primary    Chronic.  Blood pressure well controlled on amlodipine 2.5 mg daily.  We will continue this medication for now.  We will check electrolytes with kidney function and liver enzymes today.  Instructed on DASH diet and physical activity-goal for physical activity 30 minutes 5 times weekly.  Patient requesting referral to ophthalmologist to check eyes thoroughly-we will place today per request.  Follow-up 6 months.       Relevant Medications   amLODipine (NORVASC) 2.5 MG tablet   Other Relevant Orders   COMPLETE METABOLIC PANEL WITH GFR (Completed)   Ambulatory referral to Ophthalmology   Other Visit Diagnoses      Screening for colon cancer       Relevant Orders   Ambulatory referral to Gastroenterology         Follow up plan: Return in about 6 months (around 06/24/2021).

## 2021-01-19 ENCOUNTER — Encounter: Payer: BC Managed Care – PPO | Admitting: Family Medicine

## 2021-02-11 DIAGNOSIS — R350 Frequency of micturition: Secondary | ICD-10-CM | POA: Diagnosis not present

## 2021-02-11 DIAGNOSIS — N951 Menopausal and female climacteric states: Secondary | ICD-10-CM | POA: Diagnosis not present

## 2021-03-02 DIAGNOSIS — N951 Menopausal and female climacteric states: Secondary | ICD-10-CM | POA: Diagnosis not present

## 2021-03-02 DIAGNOSIS — L259 Unspecified contact dermatitis, unspecified cause: Secondary | ICD-10-CM | POA: Diagnosis not present

## 2021-03-16 ENCOUNTER — Encounter: Payer: Self-pay | Admitting: Nurse Practitioner

## 2021-03-16 DIAGNOSIS — H04123 Dry eye syndrome of bilateral lacrimal glands: Secondary | ICD-10-CM | POA: Diagnosis not present

## 2021-03-16 DIAGNOSIS — H1045 Other chronic allergic conjunctivitis: Secondary | ICD-10-CM | POA: Diagnosis not present

## 2021-03-17 DIAGNOSIS — Z1231 Encounter for screening mammogram for malignant neoplasm of breast: Secondary | ICD-10-CM | POA: Diagnosis not present

## 2021-03-17 DIAGNOSIS — Z6820 Body mass index (BMI) 20.0-20.9, adult: Secondary | ICD-10-CM | POA: Diagnosis not present

## 2021-03-17 DIAGNOSIS — N951 Menopausal and female climacteric states: Secondary | ICD-10-CM | POA: Diagnosis not present

## 2021-03-17 DIAGNOSIS — Z01419 Encounter for gynecological examination (general) (routine) without abnormal findings: Secondary | ICD-10-CM | POA: Diagnosis not present

## 2021-03-17 DIAGNOSIS — Z1211 Encounter for screening for malignant neoplasm of colon: Secondary | ICD-10-CM | POA: Diagnosis not present

## 2021-07-15 ENCOUNTER — Other Ambulatory Visit: Payer: Self-pay | Admitting: Nurse Practitioner

## 2021-07-15 DIAGNOSIS — I10 Essential (primary) hypertension: Secondary | ICD-10-CM

## 2021-07-18 ENCOUNTER — Other Ambulatory Visit: Payer: Self-pay | Admitting: Nurse Practitioner

## 2021-07-18 DIAGNOSIS — I10 Essential (primary) hypertension: Secondary | ICD-10-CM

## 2021-08-03 ENCOUNTER — Encounter: Payer: Self-pay | Admitting: Nurse Practitioner

## 2021-10-22 ENCOUNTER — Other Ambulatory Visit: Payer: Self-pay | Admitting: Nurse Practitioner

## 2021-10-22 DIAGNOSIS — I10 Essential (primary) hypertension: Secondary | ICD-10-CM

## 2021-10-25 ENCOUNTER — Other Ambulatory Visit: Payer: Self-pay

## 2021-11-26 ENCOUNTER — Ambulatory Visit (INDEPENDENT_AMBULATORY_CARE_PROVIDER_SITE_OTHER): Payer: BC Managed Care – PPO | Admitting: Family Medicine

## 2021-11-26 ENCOUNTER — Encounter: Payer: Self-pay | Admitting: Family Medicine

## 2021-11-26 VITALS — BP 117/78 | HR 60 | Temp 98.3°F | Ht 63.0 in | Wt 131.6 lb

## 2021-11-26 DIAGNOSIS — Z1211 Encounter for screening for malignant neoplasm of colon: Secondary | ICD-10-CM

## 2021-11-26 DIAGNOSIS — I10 Essential (primary) hypertension: Secondary | ICD-10-CM | POA: Diagnosis not present

## 2021-11-26 DIAGNOSIS — Z0000 Encounter for general adult medical examination without abnormal findings: Secondary | ICD-10-CM

## 2021-11-26 NOTE — Progress Notes (Signed)
Subjective:    Patient ID: Christy Collins, female    DOB: 08-24-1970, 51 y.o.   MRN: [id number]  HPI Patient is a very pleasant 51 year old African-American female who presents today for complete physical exam. She sees a gynecologist who performs her breast exams and her Pap smears and also her mammogram. She also has a history of a hysterectomy although she still has her ovaries.  Patient states that she had her mammogram about 6 months ago.  Otherwise she is doing well.  She is due for a colonoscopy.  Her blood pressure today is outstanding.  She would like to try to come off amlodipine.  She denies any chest pain shortness of breath or dyspnea on exertion.  She is taking vitamin D but she is not taking calcium due to constipation.  She is due for a flu shot in the fall but otherwise her immunizations are up-to-date. Past Medical History:  Diagnosis Date   Achalasia    Anemia    Bacterial infection    Constipation    Eczema    GERD (gastroesophageal reflux disease)    Chronic    Heart murmur    light no follow up   Hiatal hernia    History of chicken pox    Ovarian cyst    PONV (postoperative nausea and vomiting)    Stricture and stenosis of esophagus    Vitiligo    Yeast infection    Past Surgical History:  Procedure Laterality Date   ABDOMINAL HYSTERECTOMY     BREAST LUMPECTOMY     DILATION AND CURETTAGE OF UTERUS     DILITATION & CURRETTAGE/HYSTROSCOPY WITH VERSAPOINT RESECTION  06/08/2012   Procedure: DILATATION & CURETTAGE/HYSTEROSCOPY WITH VERSAPOINT RESECTION;  Surgeon: Alwyn Pea, MD;  Location: [facility];  Service: Gynecology;  Laterality: N/A;  and novasure   ESOPHAGEAL MANOMETRY N/A 09/17/2012   Procedure: ESOPHAGEAL MANOMETRY (EM);  Surgeon: Sable Feil, MD;  Location: [facility];  Service: Endoscopy;  Laterality: N/A;   ESOPHAGOGASTRODUODENOSCOPY N/A 11/05/2012   Procedure: ESOPHAGOGASTRODUODENOSCOPY (EGD);  Surgeon: Odis Hollingshead, MD;  Location: [facility];  Service: General;  Laterality: N/A;   HELLER MYOTOMY N/A 11/05/2012   Procedure: LAPAROSCOPIC ESOPHAGEAL MYOTOMY AND ANTERIOR FUNDOPLICATION;  Surgeon: Odis Hollingshead, MD;  Location: [facility];  Service: General;  Laterality: N/A;   ROBOTIC ASSISTED TOTAL HYSTERECTOMY N/A 06/20/2019   Procedure: XI ROBOTIC ASSISTED TOTAL HYSTERECTOMY. BILATERAL SALPINGECTOMY;  Surgeon: Delsa Bern, MD;  Location: [facility];  Service: Gynecology;  Laterality: N/A;   TONSILLECTOMY     TUBAL LIGATION     UTERINE FIBROID SURGERY     Current Outpatient Medications on File Prior to Visit  Medication Sig Dispense Refill   amLODipine (NORVASC) 2.5 MG tablet Take 1 tablet by mouth once daily 90 tablet 0   Black Cohosh 40 MG CAPS Take 80 mg by mouth daily.     estradiol (VIVELLE-DOT) 0.075 MG/24HR Place onto the skin.     EVENING PRIMROSE OIL PO Take by mouth.     Multiple Vitamins-Minerals (MULTIPLE VITAMINS/WOMENS) tablet Take 1 tablet by mouth daily.     Probiotic Product (PROBIOTIC PO) Take by mouth.     No current facility-administered medications on file prior to visit.   Allergies  Allergen Reactions   Other     Patient states that she cant take any pain meds and if she is put to sleep it makes her sick.   Social History  Socioeconomic History   Marital status: Married    Spouse name: Not on file   Number of children: 3   Years of education: Not on file   Highest education level: Not on file  Occupational History   Occupation: CAP CASE MANAGER    Employer: [employer]  Tobacco Use   Smoking status: Former    Packs/day: 0.50    Types: Cigarettes    Quit date: 11/05/2012    Years since quitting: 9.0   Smokeless tobacco: Never  Vaping Use   Vaping Use: Never used  Substance and Sexual Activity   Alcohol use: Yes    Comment: occasionally   Drug use: No   Sexual activity: Not on file  Other Topics Concern   Not on file  Social History Narrative   Not on file    Social Determinants of Health   Financial Resource Strain: Not on file  Food Insecurity: Not on file  Transportation Needs: Not on file  Physical Activity: Not on file  Stress: Not on file  Social Connections: Not on file  Intimate Partner Violence: Not on file   Family History  Problem Relation Age of Onset   Asthma Mother    Hyperlipidemia Mother    Alcohol abuse Father    Hypertension Father    Asthma Daughter    Cancer Maternal Grandmother    Cancer Paternal Uncle    Hypertension Paternal Uncle    Hypertension Paternal Aunt    Diabetes Paternal Uncle    Diabetes Paternal Aunt    Rheumatic fever Daughter       Review of Systems  All other systems reviewed and are negative.     Objective:   Physical Exam Vitals reviewed.  Constitutional:      General: She is not in acute distress.    Appearance: She is well-developed. She is not diaphoretic.  HENT:     Head: Normocephalic and atraumatic.     Right Ear: External ear normal.     Left Ear: External ear normal.     Nose: Nose normal.     Mouth/Throat:     Pharynx: No oropharyngeal exudate.  Eyes:     General: No scleral icterus.       Right eye: No discharge.        Left eye: No discharge.     Conjunctiva/sclera: Conjunctivae normal.     Pupils: Pupils are equal, round, and reactive to light.  Neck:     Vascular: No JVD.  Cardiovascular:     Rate and Rhythm: Normal rate and regular rhythm.     Heart sounds: Normal heart sounds. No murmur heard.   No friction rub. No gallop.  Pulmonary:     Effort: Pulmonary effort is normal. No respiratory distress.     Breath sounds: Normal breath sounds. No wheezing or rales.  Chest:     Chest wall: No tenderness.  Abdominal:     General: Bowel sounds are normal. There is no distension.     Palpations: Abdomen is soft. There is no mass.     Tenderness: There is no abdominal tenderness. There is no guarding or rebound.  Musculoskeletal:        General: No  tenderness or deformity. Normal range of motion.     Cervical back: Normal range of motion and neck supple.  Lymphadenopathy:     Cervical: No cervical adenopathy.  Skin:    General: Skin is warm.  Coloration: Skin is not pale.     Findings: Rash present.  Neurological:     Mental Status: She is alert and oriented to person, place, and time.     Cranial Nerves: No cranial nerve deficit.     Motor: No abnormal muscle tone.     Coordination: Coordination normal.     Deep Tendon Reflexes: Reflexes are normal and symmetric.  Psychiatric:        Behavior: Behavior normal.        Thought Content: Thought content normal.        Judgment: Judgment normal.          Assessment & Plan:  Colon cancer screening - Plan: Ambulatory referral to Gastroenterology  Essential hypertension - Plan: CBC with Differential/Platelet, Lipid panel, COMPLETE METABOLIC PANEL WITH GFR  General medical exam - Plan: CBC with Differential/Platelet, Lipid panel, COMPLETE METABOLIC PANEL WITH GFR Blood pressure today is outstanding.  Of asked the patient to hold amlodipine and monitor her blood pressure.  As long as it stays less than 140/90, she does not need to resume the amlodipine.  I encouraged her to take 1200 mg a day of calcium 1000 units a day and vitamin D for osteoporosis prevention.  I recommended a flu shot in the fall.  I will schedule the patient for a colonoscopy.  Her mammogram is up-to-date.  She is not yet due for a bone density test.  The remainder of her preventative care is performed by her gynecologist.

## 2021-11-27 LAB — CBC WITH DIFFERENTIAL/PLATELET
Absolute Monocytes: 383 cells/uL (ref 200–950)
Basophils Absolute: 29 cells/uL (ref 0–200)
Basophils Relative: 0.5 %
Eosinophils Absolute: 180 cells/uL (ref 15–500)
Eosinophils Relative: 3.1 %
HCT: 40 % (ref 35.0–45.0)
Hemoglobin: 12.8 g/dL (ref 11.7–15.5)
Lymphs Abs: 2134 cells/uL (ref 850–3900)
MCH: 28 pg (ref 27.0–33.0)
MCHC: 32 g/dL (ref 32.0–36.0)
MCV: 87.5 fL (ref 80.0–100.0)
MPV: 10.3 fL (ref 7.5–12.5)
Monocytes Relative: 6.6 %
Neutro Abs: 3074 cells/uL (ref 1500–7800)
Neutrophils Relative %: 53 %
Platelets: 261 10*3/uL (ref 140–400)
RBC: 4.57 10*6/uL (ref 3.80–5.10)
RDW: 11.9 % (ref 11.0–15.0)
Total Lymphocyte: 36.8 %
WBC: 5.8 10*3/uL (ref 3.8–10.8)

## 2021-11-27 LAB — COMPLETE METABOLIC PANEL WITH GFR
AG Ratio: 1.6 (calc) (ref 1.0–2.5)
ALT: 11 U/L (ref 6–29)
AST: 14 U/L (ref 10–35)
Albumin: 4.2 g/dL (ref 3.6–5.1)
Alkaline phosphatase (APISO): 23 U/L — ABNORMAL LOW (ref 37–153)
BUN: 13 mg/dL (ref 7–25)
CO2: 28 mmol/L (ref 20–32)
Calcium: 9.4 mg/dL (ref 8.6–10.4)
Chloride: 104 mmol/L (ref 98–110)
Creat: 0.89 mg/dL (ref 0.50–1.03)
Globulin: 2.7 g/dL (calc) (ref 1.9–3.7)
Glucose, Bld: 92 mg/dL (ref 65–99)
Potassium: 4.4 mmol/L (ref 3.5–5.3)
Sodium: 142 mmol/L (ref 135–146)
Total Bilirubin: 0.3 mg/dL (ref 0.2–1.2)
Total Protein: 6.9 g/dL (ref 6.1–8.1)
eGFR: 79 mL/min/{1.73_m2} (ref >=60)

## 2021-11-27 LAB — LIPID PANEL
Cholesterol: 160 mg/dL (ref <200)
HDL: 66 mg/dL (ref >=50)
LDL Cholesterol (Calc): 80 mg/dL (calc)
Non-HDL Cholesterol (Calc): 94 mg/dL (calc) (ref <130)
Total CHOL/HDL Ratio: 2.4 (calc) (ref <5.0)
Triglycerides: 64 mg/dL (ref <150)

## 2021-11-29 ENCOUNTER — Telehealth: Payer: Self-pay

## 2021-11-29 NOTE — Telephone Encounter (Signed)
-----   Message from Susy Frizzle, MD sent at 11/29/2021  7:06 AM EDT ----- Labs look good

## 2021-11-29 NOTE — Telephone Encounter (Signed)
I have attempted to contact this patient by phone with the following results: no answer.

## 2022-02-18 ENCOUNTER — Ambulatory Visit (AMBULATORY_SURGERY_CENTER): Payer: Self-pay

## 2022-02-18 VITALS — Ht 63.0 in | Wt 128.0 lb

## 2022-02-18 DIAGNOSIS — Z1211 Encounter for screening for malignant neoplasm of colon: Secondary | ICD-10-CM

## 2022-02-18 MED ORDER — NA SULFATE-K SULFATE-MG SULF 17.5-3.13-1.6 GM/177ML PO SOLN: 1.0000 | ORAL | 0 refills | Status: DC

## 2022-02-18 NOTE — Progress Notes (Signed)
No egg or soy allergy known to patient  No issues known to pt with past sedation with any surgeries or procedures Patient denies ever being told they had issues or difficulty with intubation  No FH of Malignant Hyperthermia Pt is not on diet pills Pt is not on  home 02  Pt is not on blood thinners  Pt denies issues with constipation  No A fib or A flutter Have any cardiac testing pending--denied Pt instructed to use [email or web address] or GoodRx for a price reduction on prep   

## 2022-02-26 LAB — GLUCOSE, POCT (MANUAL RESULT ENTRY): POC Glucose: 89 mg/dl (ref 70–99)

## 2022-03-02 ENCOUNTER — Encounter: Payer: Self-pay | Admitting: *Deleted

## 2022-03-02 ENCOUNTER — Ambulatory Visit: Payer: Self-pay | Admitting: *Deleted

## 2022-03-02 NOTE — Patient Instructions (Signed)
Visit Information  Thank you for taking time to visit with me today. Please don't hesitate to contact me if I can be of assistance to you.   Please call the care guide team at [phone] if you need to cancel or reschedule your appointment.   If you are experiencing a Mental Health or Behavioral Health Crisis or need someone to talk to, please call the Suicide and Crisis Lifeline: 988 call the [place] National Suicide Prevention Lifeline: 1-800-273-8255 or TTY: 1-800-799-4 TTY (1-800-799-4889) to talk to a trained counselor call 1-800-273-TALK (toll free, 24 hour hotline) go to [facility] [address], [place] ([phone]) call the [place] Crisis Line: 800-939-9988 call 911  Patient verbalizes understanding of instructions and care plan provided today and agrees to view in MyChart. Active MyChart status and patient understanding of how to access instructions and care plan via MyChart confirmed with patient.     No further follow up required.  Kedra Mcglade, BSW, MSW, LCSW  Licensed Clinical Social Worker  [facility]  Mailing Address-[address], [place], [place] [address] Physical Address-[address], [place],  [address] Toll Free Main # [phone] Fax # [phone] Cell # [phone] [email or web address]            

## 2022-03-02 NOTE — Patient Outreach (Signed)
  Care Coordination   Initial Visit Note   03/02/2022  Name: Christy Collins MRN: [id number] DOB: Apr 05, 1971  Christy Collins is a 51 y.o. year old female who sees Pickard, Cammie Mcgee, MD for primary care. I spoke with Christy Collins by phone today.  What matters to the patients health and wellness today?  No Interventions Identified.  Patient complains of right hand pain today.  CSW assisted patient in scheduling a follow-up appointment with her Primary Care Physician, Dr. Jenna Luo.  Appointment scheduled on 03/03/2022 at 12:30 pm.    Goals Addressed   None     SDOH assessments and interventions completed:  Yes.    SDOH Interventions Today    Flowsheet Row Most Recent Value  SDOH Interventions   Food Insecurity Interventions Intervention Not Indicated  Financial Strain Interventions Intervention Not Indicated  Housing Interventions Intervention Not Indicated  Physical Activity Interventions Intervention Not Indicated  Stress Interventions Intervention Not Indicated  Social Connections Interventions Intervention Not Indicated  Transportation Interventions Intervention Not Indicated        Care Coordination Interventions Activated:  Yes.   Care Coordination Interventions:  Yes, provided.   Follow up plan: No further intervention required.   Encounter Outcome:  Pt. Visit Completed.   Nat Christen, BSW, MSW, LCSW  Licensed Education officer, [facility]  Mailing [place] [address], [place], [place] [address] Physical Address-[address] [place], [place] [address] Toll Free Main # [phone] Fax # [phone] Cell # [phone] [email or web address]

## 2022-03-03 ENCOUNTER — Ambulatory Visit (INDEPENDENT_AMBULATORY_CARE_PROVIDER_SITE_OTHER): Payer: BC Managed Care – PPO | Admitting: Family Medicine

## 2022-03-03 VITALS — BP 112/62 | HR 72 | Temp 97.6°F | Ht 63.5 in | Wt 128.6 lb

## 2022-03-03 DIAGNOSIS — M79644 Pain in right finger(s): Secondary | ICD-10-CM

## 2022-03-03 MED ORDER — MELOXICAM 15 MG PO TABS: 15.0000 mg | ORAL_TABLET | Freq: Every day | ORAL | 0 refills | Status: AC

## 2022-03-03 NOTE — Progress Notes (Signed)
Subjective:    Patient ID: Christy Collins, female    DOB: March 07, 1971, 51 y.o.   MRN: [id number] Patient is a very pleasant 51 year old African-American female who presents today with pain at the base of her right thumb.  The pain is located at the first MCP joint.  She denies any specific injury.  There is no instability to radial or ulnar stress to suggest an ulnar collateral ligament tear.  There is no erythema or warmth but there is some tenderness to palpation at the MCP joint.  She states that some days it aches and throbs so much it hurts to right.  Other days it does not hurt at all.  Is been going on gradually for a few months now.  Past Medical History:  Diagnosis Date   Achalasia    Anemia    Bacterial infection    Constipation    Eczema    GERD (gastroesophageal reflux disease)    Chronic    Heart murmur    light no follow up   Hiatal hernia    History of chicken pox    Ovarian cyst    PONV (postoperative nausea and vomiting)    Stricture and stenosis of esophagus    Vitiligo    Yeast infection    Past Surgical History:  Procedure Laterality Date   ABDOMINAL HYSTERECTOMY     BREAST LUMPECTOMY     DILATION AND CURETTAGE OF UTERUS     DILITATION & CURRETTAGE/HYSTROSCOPY WITH VERSAPOINT RESECTION  06/08/2012   Procedure: DILATATION & CURETTAGE/HYSTEROSCOPY WITH VERSAPOINT RESECTION;  Surgeon: Alwyn Pea, MD;  Location: [facility];  Service: Gynecology;  Laterality: N/A;  and novasure   ESOPHAGEAL MANOMETRY N/A 09/17/2012   Procedure: ESOPHAGEAL MANOMETRY (EM);  Surgeon: Sable Feil, MD;  Location: [facility];  Service: Endoscopy;  Laterality: N/A;   ESOPHAGOGASTRODUODENOSCOPY N/A 11/05/2012   Procedure: ESOPHAGOGASTRODUODENOSCOPY (EGD);  Surgeon: Odis Hollingshead, MD;  Location: [facility];  Service: General;  Laterality: N/A;   HELLER MYOTOMY N/A 11/05/2012   Procedure: LAPAROSCOPIC ESOPHAGEAL MYOTOMY AND ANTERIOR FUNDOPLICATION;  Surgeon: Odis Hollingshead, MD;   Location: [facility];  Service: General;  Laterality: N/A;   ROBOTIC ASSISTED TOTAL HYSTERECTOMY N/A 06/20/2019   Procedure: XI ROBOTIC ASSISTED TOTAL HYSTERECTOMY. BILATERAL SALPINGECTOMY;  Surgeon: Delsa Bern, MD;  Location: [facility];  Service: Gynecology;  Laterality: N/A;   TONSILLECTOMY     TUBAL LIGATION     UTERINE FIBROID SURGERY     Current Outpatient Medications on File Prior to Visit  Medication Sig Dispense Refill   Na Sulfate-K Sulfate-Mg Sulf 17.5-3.13-1.6 GM/177ML SOLN Take 1 kit by mouth as directed. May use generic Suprep, no prior authorization. Take as directed. 354 mL 0   No current facility-administered medications on file prior to visit.   Allergies  Allergen Reactions   Other     Patient states that she cant take any pain meds and if she is put to sleep it makes her sick.   Social History   Socioeconomic History   Marital status: Married    Spouse name: Brelynn Wheller   Number of children: 3   Years of education: 13   Highest education level: Some college, no degree  Occupational History   Occupation: CAP CASE MANAGER    Employer: [employer]  Tobacco Use   Smoking status: Former    Packs/day: 0.50    Types: Cigarettes    Quit date: 11/05/2012  Years since quitting: 9.3    Passive exposure: Past   Smokeless tobacco: Never  Vaping Use   Vaping Use: Never used  Substance and Sexual Activity   Alcohol use: Yes    Comment: occasionally   Drug use: No   Sexual activity: Not Currently    Partners: Male    Birth control/protection: Surgical  Other Topics Concern   Not on file  Social History Narrative   Not on file   Social Determinants of Health   Financial Resource Strain: Low Risk  (03/02/2022)   Overall Financial Resource Strain (CARDIA)    Difficulty of Paying Living Expenses: Not hard at all  Food Insecurity: No Food Insecurity (03/02/2022)   Hunger Vital Sign    Worried About Running Out of Food in the Last Year:  Never true    Ran Out of Food in the Last Year: Never true  Transportation Needs: No Transportation Needs (03/02/2022)   PRAPARE - Transportation    Lack of Transportation (Medical): No    Lack of Transportation (Non-Medical): No  Physical Activity: Sufficiently Active (03/02/2022)   Exercise Vital Sign    Days of Exercise per Week: 5 days    Minutes of Exercise per Session: 50 min  Stress: No Stress Concern Present (03/02/2022)   [place]    Feeling of Stress : Not at all  Social Connections: [place] (03/02/2022)   Social Connection and Isolation Panel [NHANES]    Frequency of Communication with Friends and Family: More than three times a week    Frequency of Social Gatherings with Friends and Family: More than three times a week    Attends Religious Services: More than 4 times per year    Active Member of [employer] or Organizations: Yes    Attends Music therapist: More than 4 times per year    Marital Status: Married  Human resources officer Violence: Not At Risk (03/02/2022)   Humiliation, Afraid, Rape, and Kick questionnaire    Fear of Current or Ex-Partner: No    Emotionally Abused: No    Physically Abused: No    Sexually Abused: No   Family History  Problem Relation Age of Onset   Asthma Mother    Hyperlipidemia Mother    Alcohol abuse Father    Hypertension Father    Hypertension Paternal Aunt    Diabetes Paternal Aunt    Cancer Paternal Uncle    Hypertension Paternal Uncle    Diabetes Paternal Uncle    Cancer Maternal Grandmother    Asthma Daughter    Rheumatic fever Daughter    Colon cancer Neg Hx    Colon polyps Neg Hx    Esophageal cancer Neg Hx    Rectal cancer Neg Hx    Stomach cancer Neg Hx       Review of Systems  All other systems reviewed and are negative.      Objective:   Physical Exam Vitals reviewed.  Constitutional:      General: She is not in acute  distress.    Appearance: Normal appearance. She is well-developed and normal weight. She is not ill-appearing, toxic-appearing or diaphoretic.  Cardiovascular:     Rate and Rhythm: Normal rate and regular rhythm.     Heart sounds: Normal heart sounds. No murmur heard.    No friction rub. No gallop.  Pulmonary:     Effort: Pulmonary effort is normal. No respiratory distress.  Breath sounds: Normal breath sounds. No stridor. No wheezing, rhonchi or rales.  Chest:     Chest wall: No tenderness.  Abdominal:     Palpations: Abdomen is soft.  Musculoskeletal:     Right hand: Tenderness and bony tenderness present. No swelling. Decreased range of motion. Normal strength. Normal sensation.  Skin:    General: Skin is warm.     Findings: No rash.  Neurological:     Mental Status: She is alert.           Assessment & Plan:  Thumb pain, right - Plan: DG Hand Complete Right I suspect the patient has arthritis at the first MCP joint.  Begin meloxicam 15 mg daily as needed.  If she does not want to do that she could also do topical Voltaren gel.  Obtain an x-ray to evaluate further given the significance of the pain.  Exam today does not support an ulnar collateral ligament tear.

## 2022-03-17 DIAGNOSIS — Z1211 Encounter for screening for malignant neoplasm of colon: Secondary | ICD-10-CM | POA: Diagnosis not present

## 2022-03-17 DIAGNOSIS — Z79890 Hormone replacement therapy (postmenopausal): Secondary | ICD-10-CM | POA: Diagnosis not present

## 2022-03-17 DIAGNOSIS — Z01419 Encounter for gynecological examination (general) (routine) without abnormal findings: Secondary | ICD-10-CM | POA: Diagnosis not present

## 2022-03-17 DIAGNOSIS — Z1231 Encounter for screening mammogram for malignant neoplasm of breast: Secondary | ICD-10-CM | POA: Diagnosis not present

## 2022-03-18 DIAGNOSIS — Z1231 Encounter for screening mammogram for malignant neoplasm of breast: Secondary | ICD-10-CM | POA: Diagnosis not present

## 2022-03-21 ENCOUNTER — Encounter: Payer: Self-pay | Admitting: Internal Medicine

## 2022-04-05 ENCOUNTER — Encounter: Payer: BC Managed Care – PPO | Admitting: Internal Medicine

## 2022-04-12 ENCOUNTER — Ambulatory Visit (AMBULATORY_SURGERY_CENTER): Payer: Self-pay

## 2022-04-12 VITALS — Ht 63.5 in | Wt 130.2 lb

## 2022-04-12 DIAGNOSIS — Z1211 Encounter for screening for malignant neoplasm of colon: Secondary | ICD-10-CM

## 2022-04-12 MED ORDER — ONDANSETRON HCL 4 MG PO TABS: 4.0000 mg | ORAL_TABLET | ORAL | 0 refills | Status: DC

## 2022-04-12 MED ORDER — NA SULFATE-K SULFATE-MG SULF 17.5-3.13-1.6 GM/177ML PO SOLN: 1.0000 | Freq: Once | ORAL | 0 refills | Status: AC

## 2022-04-12 NOTE — Progress Notes (Signed)
No egg or soy allergy known to patient  No issues known to pt with past sedation with any surgeries or procedures Patient denies ever being told they had issues or difficulty with intubation  No FH of Malignant Hyperthermia Pt is not on diet pills Pt is not on  home 02  Pt is not on blood thinners  Pt denies issues with constipation  No A fib or A flutter Have any cardiac testing pending--no Pt instructed to use [email or web address] or GoodRx for a price reduction on prep   

## 2022-05-09 ENCOUNTER — Ambulatory Visit (AMBULATORY_SURGERY_CENTER): Payer: BC Managed Care – PPO | Admitting: Internal Medicine

## 2022-05-09 ENCOUNTER — Encounter: Payer: Self-pay | Admitting: Internal Medicine

## 2022-05-09 VITALS — BP 125/71 | HR 64 | Temp 97.3°F | Resp 12 | Ht 63.5 in | Wt 130.2 lb

## 2022-05-09 DIAGNOSIS — Z1211 Encounter for screening for malignant neoplasm of colon: Secondary | ICD-10-CM | POA: Diagnosis not present

## 2022-05-09 DIAGNOSIS — D122 Benign neoplasm of ascending colon: Secondary | ICD-10-CM | POA: Diagnosis not present

## 2022-05-09 MED ORDER — SODIUM CHLORIDE 0.9 % IV SOLN: 500.0000 mL | Freq: Once | INTRAVENOUS | Status: DC

## 2022-05-09 NOTE — Progress Notes (Signed)
Pt's states no medical or surgical changes since previsit or office visit. VS assessed by C.W 

## 2022-05-09 NOTE — Progress Notes (Signed)
Called to room to assist during endoscopic procedure.  Patient ID and intended procedure confirmed with present staff. Received instructions for my participation in the procedure from the performing physician.  

## 2022-05-09 NOTE — Progress Notes (Signed)
HISTORY OF PRESENT ILLNESS:  Christy Collins is a 51 y.o. female who presents for screening colonoscopy.  No complaints  REVIEW OF SYSTEMS:  All non-GI ROS negative. Past Medical History:  Diagnosis Date   Achalasia    Anemia    Bacterial infection    Constipation    Eczema    GERD (gastroesophageal reflux disease)    Chronic    Heart murmur    light no follow up   Hiatal hernia    History of chicken pox    Ovarian cyst    PONV (postoperative nausea and vomiting)    Stricture and stenosis of esophagus    Vitiligo    Yeast infection     Past Surgical History:  Procedure Laterality Date   ABDOMINAL HYSTERECTOMY     BREAST LUMPECTOMY     DILATION AND CURETTAGE OF UTERUS     DILITATION & CURRETTAGE/HYSTROSCOPY WITH VERSAPOINT RESECTION  06/08/2012   Procedure: DILATATION & CURETTAGE/HYSTEROSCOPY WITH VERSAPOINT RESECTION;  Surgeon: Alwyn Pea, MD;  Location: [facility];  Service: Gynecology;  Laterality: N/A;  and novasure   ESOPHAGEAL MANOMETRY N/A 09/17/2012   Procedure: ESOPHAGEAL MANOMETRY (EM);  Surgeon: Sable Feil, MD;  [facility]: [facility];  Service: Endoscopy;  Laterality: N/A;   ESOPHAGOGASTRODUODENOSCOPY N/A 11/05/2012   Procedure: ESOPHAGOGASTRODUODENOSCOPY (EGD);  Surgeon: Odis Hollingshead, MD;  Location: [facility];  Service: General;  Laterality: N/A;   HELLER MYOTOMY N/A 11/05/2012   Procedure: LAPAROSCOPIC ESOPHAGEAL MYOTOMY AND ANTERIOR FUNDOPLICATION;  Surgeon: Odis Hollingshead, MD;  Location: [facility];  Service: General;  Laterality: N/A;   ROBOTIC ASSISTED TOTAL HYSTERECTOMY N/A 06/20/2019   Procedure: XI ROBOTIC ASSISTED TOTAL HYSTERECTOMY. BILATERAL SALPINGECTOMY;  Surgeon: Delsa Bern, MD;  Location: [facility];  Service: Gynecology;  Laterality: N/A;   TONSILLECTOMY     TUBAL LIGATION     UTERINE FIBROID SURGERY      Social History BRYTNEE BECHLER  reports that she quit smoking about 9 years ago. Her smoking use included  cigarettes. She smoked an average of .5 packs per day. She has been exposed to tobacco smoke. She has never used smokeless tobacco. She reports current alcohol use of about 3.0 standard drinks of alcohol per week. She reports that she does not use drugs.  family history includes Alcohol abuse in her father; Asthma in her daughter and mother; Cancer in her maternal grandmother and paternal uncle; Diabetes in her paternal aunt and paternal uncle; Hyperlipidemia in her mother; Hypertension in her father, paternal aunt, and paternal uncle; Rheumatic fever in her daughter.  Allergies  Allergen Reactions   Other     Patient states that she cant take any pain meds and if she is put to sleep it makes her sick.       PHYSICAL EXAMINATION: Vital signs: BP 112/71   Pulse 62   Temp (!) 97.3 F (36.3 C) (Skin)   Ht 5' 3.5" (1.613 m)   Wt 130 lb 3.2 oz (59.1 kg)   LMP 05/16/2019 (Exact Date)   SpO2 100%   BMI 22.70 kg/m  General: Well-developed, well-nourished, no acute distress HEENT: Sclerae are anicteric, conjunctiva pink. Oral mucosa intact Lungs: Clear Heart: Regular Abdomen: soft, nontender, nondistended, no obvious ascites, no peritoneal signs, normal bowel sounds. No organomegaly. Extremities: No edema Psychiatric: alert and oriented x3. Cooperative      ASSESSMENT:  Colon cancer screening   PLAN:  Screening colonoscopy

## 2022-05-09 NOTE — Op Note (Signed)
[facility] Patient Name: Christy Collins Procedure Date: 05/09/2022 3:23 PM MRN: [id number] Endoscopist: Docia Chuck. Henrene Pastor , MD, [id number] Age: 51 Referring MD:  Date of Birth: 12-22-1970 Gender: Female Account #: [id number] Procedure:                Colonoscopy with cold snare polypectomy x 1 Indications:              Screening for colorectal malignant neoplasm Medicines:                Monitored Anesthesia Care Procedure:                Pre-Anesthesia Assessment:                           - Prior to the procedure, a History and Physical                            was performed, and patient medications and                            allergies were reviewed. The patient's tolerance of                            previous anesthesia was also reviewed. The risks                            and benefits of the procedure and the sedation                            options and risks were discussed with the patient.                            All questions were answered, and informed consent                            was obtained. Prior Anticoagulants: The patient has                            taken no anticoagulant or antiplatelet agents. ASA                            Grade Assessment: I - A normal, healthy patient.                            After reviewing the risks and benefits, the patient                            was deemed in satisfactory condition to undergo the                            procedure.                           After obtaining informed consent, the colonoscope  was passed under direct vision. Throughout the                            procedure, the patient's blood pressure, pulse, and                            oxygen saturations were monitored continuously. The                            Olympus CF-HQ190L (Serial# [id number]) Colonoscope was                            introduced through the anus and advanced to the the                             cecum, identified by appendiceal orifice and                            ileocecal valve. The ileocecal valve, appendiceal                            orifice, and rectum were photographed. The quality                            of the bowel preparation was excellent. The                            colonoscopy was performed without difficulty. The                            patient tolerated the procedure well. The bowel                            preparation used was SUPREP via split dose                            instruction. Scope In: 3:33:05 PM Scope Out: 3:50:33 PM Scope Withdrawal Time: 0 hours 11 minutes 2 seconds  Total Procedure Duration: 0 hours 17 minutes 28 seconds  Findings:                 A 3 mm polyp was found in the ascending colon. The                            polyp was removed with a cold snare. Resection and                            retrieval were complete.                           The exam was otherwise without abnormality on                            direct and retroflexion views. Complications:  No immediate complications. Estimated blood loss:                            None. Estimated Blood Loss:     Estimated blood loss: none. Impression:               - One 3 mm polyp in the ascending colon, removed                            with a cold snare. Resected and retrieved.                           - The examination was otherwise normal on direct                            and retroflexion views. Recommendation:           - Repeat colonoscopy in 7-10 years for surveillance.                           - Patient has a contact number available for                            emergencies. The signs and symptoms of potential                            delayed complications were discussed with the                            patient. Return to normal activities tomorrow.                            Written discharge instructions were provided to the                             patient.                           - Resume previous diet.                           - Continue present medications.                           - Await pathology results. Docia Chuck. Henrene Pastor, MD 05/09/2022 4:02:16 PM This report has been signed electronically.

## 2022-05-09 NOTE — Patient Instructions (Addendum)
  Patient has a contact number available for emergencies. The signs and symptoms of potential delayed complications were discussed with the patient. Return to normal activities tomorrow. Written discharge instructions were provided to the patient. - Resume previous diet. - Continue present medications. - Await pathology results.  YOU HAD AN ENDOSCOPIC PROCEDURE TODAY: Refer to the procedure report and other information in the discharge instructions given to you for any specific questions about what was found during the examination. If this information does not answer your questions, please call [place] office at [phone] to clarify.   YOU SHOULD EXPECT: Some feelings of bloating in the abdomen. Passage of more gas than usual. Walking can help get rid of the air that was put into your GI tract during the procedure and reduce the bloating. If you had a lower endoscopy (such as a colonoscopy or flexible sigmoidoscopy) you may notice spotting of blood in your stool or on the toilet paper. Some abdominal soreness may be present for a day or two, also.  DIET: Your first meal following the procedure should be a light meal and then it is ok to progress to your normal diet. A half-sandwich or bowl of soup is an example of a good first meal. Heavy or fried foods are harder to digest and may make you feel nauseous or bloated. Drink plenty of fluids but you should avoid alcoholic beverages for 24 hours. If you had a esophageal dilation, please see attached instructions for diet.    ACTIVITY: Your care partner should take you home directly after the procedure. You should plan to take it easy, moving slowly for the rest of the day. You can resume normal activity the day after the procedure however YOU SHOULD NOT DRIVE, use power tools, machinery or perform tasks that involve climbing or major physical exertion for 24 hours (because of the sedation medicines used during the test).   SYMPTOMS TO REPORT  IMMEDIATELY: A gastroenterologist can be reached at any hour. Please call [phone]  for any of the following symptoms:  Following lower endoscopy (colonoscopy, flexible sigmoidoscopy) Excessive amounts of blood in the stool  Significant tenderness, worsening of abdominal pains  Swelling of the abdomen that is new, acute  Fever of 100 or higher  FOLLOW UP:  If any biopsies were taken you will be contacted by phone or by letter within the next 1-3 weeks. Call [phone]  if you have not heard about the biopsies in 3 weeks.  Please also call with any specific questions about appointments or follow up tests.

## 2022-05-10 ENCOUNTER — Telehealth: Payer: Self-pay | Admitting: *Deleted

## 2022-05-10 NOTE — Telephone Encounter (Signed)
  Follow up Call-     05/09/2022    3:11 PM  Call back number  Post procedure Call Back phone  # [phone]  Permission to leave phone message Yes     Patient questions:  Do you have a fever, pain , or abdominal swelling? No. Pain Score  0 *  Have you tolerated food without any problems? Yes.    Have you been able to return to your normal activities? Yes.    Do you have any questions about your discharge instructions: Diet   No. Medications  No. Follow up visit  No.  Do you have questions or concerns about your Care? No.  Actions: * If pain score is 4 or above: No action needed, pain <4.

## 2022-05-12 ENCOUNTER — Encounter: Payer: Self-pay | Admitting: Internal Medicine

## 2022-07-26 ENCOUNTER — Ambulatory Visit: Payer: BC Managed Care – PPO | Admitting: Family Medicine

## 2022-08-01 ENCOUNTER — Other Ambulatory Visit: Payer: Self-pay | Admitting: Family Medicine

## 2022-08-01 DIAGNOSIS — Z1231 Encounter for screening mammogram for malignant neoplasm of breast: Secondary | ICD-10-CM

## 2022-08-06 ENCOUNTER — Ambulatory Visit (HOSPITAL_COMMUNITY)
Admission: EM | Admit: 2022-08-06 | Discharge: 2022-08-06 | Disposition: A | Discharge status: Home / Self Care | Payer: BC Managed Care – PPO | Attending: Physician Assistant | Admitting: Physician Assistant

## 2022-08-06 ENCOUNTER — Other Ambulatory Visit: Payer: Self-pay

## 2022-08-06 ENCOUNTER — Encounter (HOSPITAL_COMMUNITY): Payer: Self-pay | Admitting: *Deleted

## 2022-08-06 DIAGNOSIS — N760 Acute vaginitis: Secondary | ICD-10-CM | POA: Diagnosis not present

## 2022-08-06 DIAGNOSIS — M545 Low back pain, unspecified: Secondary | ICD-10-CM

## 2022-08-06 LAB — POCT URINALYSIS DIPSTICK, ED / UC
Bilirubin Urine: NEGATIVE
Glucose, UA: NEGATIVE mg/dL
Hgb urine dipstick: NEGATIVE
Ketones, ur: NEGATIVE mg/dL
Leukocytes,Ua: NEGATIVE
Nitrite: NEGATIVE
Protein, ur: NEGATIVE mg/dL
Specific Gravity, Urine: 1.02 (ref 1.005–1.030)
Urobilinogen, UA: 0.2 mg/dL (ref 0.0–1.0)
pH: 7.5 (ref 5.0–8.0)

## 2022-08-06 MED ORDER — FLUCONAZOLE 150 MG PO TABS: 150.0000 mg | ORAL_TABLET | Freq: Every day | ORAL | 0 refills | Status: DC

## 2022-08-06 NOTE — Discharge Instructions (Addendum)
Will send urine out for culture and call with results Will call with results of swab as well. Take Diflucan as directed. Recommend ibuprofen or Tylenol as needed for lower back pain. Recommend light stretching and ice to affected area. Drink plenty of fluids.

## 2022-08-06 NOTE — ED Provider Notes (Signed)
[place]    CSN: [id number] Arrival date & time: 08/06/22  1011      History   Chief Complaint Chief Complaint  Patient presents with   Cystitis    HPI Christy Collins is a 52 y.o. female.   Patient complains of lower back pain and increased urinary frequency that started several days ago.  Denies new injury or trauma.  She reports mild dysuria/vaginal irritation.  She complains of increased vaginal discharge.  She denies vaginal discharge or odor.  She is not concerned about STIs.  She has been taking nothing for the symptoms.  She feels like today symptoms are similar to previous UTI symptoms in the past.      Past Medical History:  Diagnosis Date   Achalasia    Anemia    Bacterial infection    Constipation    Eczema    GERD (gastroesophageal reflux disease)    Chronic    Heart murmur    light no follow up   Hiatal hernia    History of chicken pox    Ovarian cyst    PONV (postoperative nausea and vomiting)    Stricture and stenosis of esophagus    Vitiligo    Yeast infection     Patient Active Problem List   Diagnosis Date Noted   Essential hypertension 12/23/2020   Menorrhagia 06/20/2019   Eczema 04/19/2018   Hiatal hernia 04/19/2018   Uterine leiomyoma 04/19/2018   Vitiligo    ANXIETY 04/29/2008   ACHALASIA s/p laparoscopic Heller myotomy and Dor Fundoplication 89/21/1941   ESOPHAGEAL STRICTURE 04/24/2008   GERD 04/24/2008   CONSTIPATION 04/24/2008    Past Surgical History:  Procedure Laterality Date   ABDOMINAL HYSTERECTOMY     BREAST LUMPECTOMY     DILATION AND CURETTAGE OF UTERUS     DILITATION & CURRETTAGE/HYSTROSCOPY WITH VERSAPOINT RESECTION  06/08/2012   Procedure: DILATATION & CURETTAGE/HYSTEROSCOPY WITH VERSAPOINT RESECTION;  Surgeon: Alwyn Pea, MD;  Location: [facility];  Service: Gynecology;  Laterality: N/A;  and novasure   ESOPHAGEAL MANOMETRY N/A 09/17/2012   Procedure: ESOPHAGEAL MANOMETRY (EM);  Surgeon: Sable Feil, MD;  Location: [facility];  Service: Endoscopy;  Laterality: N/A;   ESOPHAGOGASTRODUODENOSCOPY N/A 11/05/2012   Procedure: ESOPHAGOGASTRODUODENOSCOPY (EGD);  Surgeon: Odis Hollingshead, MD;  Location: [facility];  Service: General;  Laterality: N/A;   HELLER MYOTOMY N/A 11/05/2012   Procedure: LAPAROSCOPIC ESOPHAGEAL MYOTOMY AND ANTERIOR FUNDOPLICATION;  Surgeon: Odis Hollingshead, MD;  Location: [facility];  Service: General;  Laterality: N/A;   ROBOTIC ASSISTED TOTAL HYSTERECTOMY N/A 06/20/2019   Procedure: XI ROBOTIC ASSISTED TOTAL HYSTERECTOMY. BILATERAL SALPINGECTOMY;  Surgeon: Delsa Bern, MD;  Location: [facility];  Service: Gynecology;  Laterality: N/A;   TONSILLECTOMY     TUBAL LIGATION     UTERINE FIBROID SURGERY      OB History     Gravida  4   Para  3   Term      Preterm      AB  1   Living  3      SAB      IAB      Ectopic      Multiple      Live Births  3            Home Medications    Prior to Admission medications   Medication Sig Start Date End Date Taking? Authorizing Provider  fluconazole (DIFLUCAN) 150 MG tablet  Take 1 tablet (150 mg total) by mouth daily. 1 tablet by mouth, if no improvement after 72 hours may take second tablet by mouth 08/06/22  Yes Ward, Lenise Arena, PA-C  BIOTIN 5000 PO Take by mouth.    [provider]  estradiol (VIVELLE-DOT) 0.075 MG/24HR Place 1 patch onto the skin once a week.    [provider]  ibuprofen (ADVIL) 400 MG tablet Take 400 mg by mouth every 6 (six) hours as needed.    [provider]  meloxicam (MOBIC) 15 MG tablet Take 1 tablet (15 mg total) by mouth daily. Patient not taking: Reported on 05/09/2022 03/03/22   Susy Frizzle, MD  Omega-3 Fatty Acids (FISH OIL OMEGA-3 PO) Take by mouth. Patient not taking: Reported on 05/09/2022    [provider]  ondansetron (ZOFRAN) 4 MG tablet Take 1 tablet (4 mg total) by mouth as directed for 2 doses.  04/12/22   Irene Shipper, MD  Na Sulfate-K Sulfate-Mg Sulf 17.5-3.13-1.6 GM/177ML SOLN Take 1 kit by mouth as directed. May use generic Suprep, no prior authorization. Take as directed. 02/18/22   Irene Shipper, MD    Family History Family History  Problem Relation Age of Onset   Asthma Mother    Hyperlipidemia Mother    Alcohol abuse Father    Hypertension Father    Hypertension Paternal Aunt    Diabetes Paternal Aunt    Cancer Paternal Uncle    Hypertension Paternal Uncle    Diabetes Paternal Uncle    Cancer Maternal Grandmother    Asthma Daughter    Rheumatic fever Daughter    Colon cancer Neg Hx    Colon polyps Neg Hx    Esophageal cancer Neg Hx    Rectal cancer Neg Hx    Stomach cancer Neg Hx     Social History Social History   Tobacco Use   Smoking status: Former    Packs/day: 0.50    Types: Cigarettes    Quit date: 11/05/2012    Years since quitting: 9.7    Passive exposure: Past   Smokeless tobacco: Never  Vaping Use   Vaping Use: Never used  Substance Use Topics   Alcohol use: Yes    Alcohol/week: 3.0 standard drinks of alcohol    Types: 3 Glasses of wine per week   Drug use: Never     Allergies   Other   Review of Systems Review of Systems  Constitutional:  Negative for chills and fever.  HENT:  Negative for ear pain and sore throat.   Eyes:  Negative for pain and visual disturbance.  Respiratory:  Negative for cough and shortness of breath.   Cardiovascular:  Negative for chest pain and palpitations.  Gastrointestinal:  Negative for abdominal pain and vomiting.  Genitourinary:  Positive for dysuria, urgency and vaginal discharge. Negative for hematuria.  Musculoskeletal:  Negative for arthralgias and back pain.  Skin:  Negative for color change and rash.  Neurological:  Negative for seizures and syncope.  All other systems reviewed and are negative.    Physical Exam Triage Vital Signs ED Triage Vitals  Enc Vitals Group     BP 08/06/22  1124 (!) 152/85     Pulse Rate 08/06/22 1124 64     Resp 08/06/22 1124 18     Temp 08/06/22 1124 98.1 F (36.7 C)     Temp src --      SpO2 08/06/22 1124 99 %  Weight --      Height --      Head Circumference --      Peak Flow --      Pain Score 08/06/22 1123 10     Pain Loc --      Pain Edu? --      Excl. in [place]? --    No data found.  Updated Vital Signs BP (!) 152/85   Pulse 64   Temp 98.1 F (36.7 C)   Resp 18   LMP 05/16/2019 (Exact Date)   SpO2 99%   Visual Acuity Right Eye Distance:   Left Eye Distance:   Bilateral Distance:    Right Eye Near:   Left Eye Near:    Bilateral Near:     Physical Exam Vitals and nursing note reviewed.  Constitutional:      General: She is not in acute distress.    Appearance: She is well-developed.  HENT:     Head: Normocephalic and atraumatic.  Eyes:     Conjunctiva/sclera: Conjunctivae normal.  Cardiovascular:     Rate and Rhythm: Normal rate and regular rhythm.     Heart sounds: No murmur heard. Pulmonary:     Effort: Pulmonary effort is normal. No respiratory distress.     Breath sounds: Normal breath sounds.  Abdominal:     Palpations: Abdomen is soft.     Tenderness: There is no abdominal tenderness.  Musculoskeletal:        General: No swelling.     Cervical back: Neck supple.     Comments: Bilateral lumbar paraspinal muscle tenderness  Skin:    General: Skin is warm and dry.     Capillary Refill: Capillary refill takes less than 2 seconds.  Neurological:     Mental Status: She is alert.  Psychiatric:        Mood and Affect: Mood normal.      UC Treatments / Results  Labs (all labs ordered are listed, but only abnormal results are displayed) Labs Reviewed  URINE CULTURE  POCT URINALYSIS DIPSTICK, ED / UC  CERVICOVAGINAL ANCILLARY ONLY    EKG   Radiology No results found.  Procedures Procedures (including critical care time)  Medications Ordered in UC Medications - No data to  display  Initial Impression / Assessment and Plan / UC Course  I have reviewed the triage vital signs and the nursing notes.  Pertinent labs & imaging results that were available during my care of the patient were reviewed by me and considered in my medical decision making (see chart for details).     No signs of UTI on urinalysis today in clinic, will send out urine culture.  Cervicovaginal swab in clinic today.  Will treat for vaginal yeast And change treatment plan based on results if indicated.  Low back pain possibly muscular in nature.  Discussed supportive care.  Return precautions discussed Final Clinical Impressions(s) / UC Diagnoses   Final diagnoses:  Vaginosis  Acute bilateral low back pain without sciatica     Discharge Instructions      Will send urine out for culture and call with results Will call with results of swab as well. Take Diflucan as directed. Recommend ibuprofen or Tylenol as needed for lower back pain. Recommend light stretching and ice to affected area. Drink plenty of fluids.    ED Prescriptions     Medication Sig Dispense Auth. Provider   fluconazole (DIFLUCAN) 150 MG tablet Take 1 tablet (150 mg total)  by mouth daily. 1 tablet by mouth, if no improvement after 72 hours may take second tablet by mouth 2 tablet Ward, Lenise Arena, PA-C      PDMP not reviewed this encounter.   Ward, Lenise Arena, PA-C 08/06/22 1141

## 2022-08-06 NOTE — ED Triage Notes (Signed)
PT reports frequency and back pain. Pt thinks she has a UTI

## 2022-08-07 LAB — URINE CULTURE

## 2022-08-08 ENCOUNTER — Encounter: Payer: Self-pay | Admitting: Family Medicine

## 2022-08-08 ENCOUNTER — Ambulatory Visit (INDEPENDENT_AMBULATORY_CARE_PROVIDER_SITE_OTHER): Payer: BC Managed Care – PPO | Admitting: Family Medicine

## 2022-08-08 VITALS — BP 120/72 | HR 69 | Temp 98.2°F | Ht 63.0 in | Wt 136.0 lb

## 2022-08-08 DIAGNOSIS — R0789 Other chest pain: Secondary | ICD-10-CM

## 2022-08-08 DIAGNOSIS — R1030 Lower abdominal pain, unspecified: Secondary | ICD-10-CM

## 2022-08-08 DIAGNOSIS — N949 Unspecified condition associated with female genital organs and menstrual cycle: Secondary | ICD-10-CM | POA: Diagnosis not present

## 2022-08-08 DIAGNOSIS — R1032 Left lower quadrant pain: Secondary | ICD-10-CM | POA: Diagnosis not present

## 2022-08-08 DIAGNOSIS — R300 Dysuria: Secondary | ICD-10-CM

## 2022-08-08 LAB — CERVICOVAGINAL ANCILLARY ONLY
Bacterial Vaginitis (gardnerella): NEGATIVE
Candida Glabrata: NEGATIVE
Candida Vaginitis: NEGATIVE
Chlamydia: NEGATIVE
Comment: NEGATIVE
Comment: NEGATIVE
Comment: NEGATIVE
Comment: NEGATIVE
Comment: NEGATIVE
Comment: NORMAL
Neisseria Gonorrhea: NEGATIVE
Trichomonas: NEGATIVE

## 2022-08-08 LAB — URINALYSIS, ROUTINE W REFLEX MICROSCOPIC
Glucose, UA: NEGATIVE
Hgb urine dipstick: NEGATIVE
Hyaline Cast: NONE SEEN /LPF
Leukocytes,Ua: NEGATIVE
Nitrite: NEGATIVE
RBC / HPF: NONE SEEN /HPF (ref 0–2)
Specific Gravity, Urine: 1.035 (ref 1.001–1.035)
pH: 6 (ref 5.0–8.0)

## 2022-08-08 LAB — WET PREP FOR TRICH, YEAST, CLUE

## 2022-08-08 LAB — MICROSCOPIC MESSAGE

## 2022-08-08 NOTE — Assessment & Plan Note (Signed)
Patient has ongoing vague lower abdominal and lower back pain for 4 days that is overall improving. She reports it is causing her GERD to act up as well resulting in chest pain. UA, wet prep, and EKG normal. Will obtain CBC and CMP. Discussed CT scan with patient and she would like to wait as pain is overall improving. Instructed to seek medical care if symptoms persist or worsen and proceed to ED for severe abdominal pain, nausea, vomiting, inability to tolerate PO intake, chest pain, shortness of breath. If pain worsens will obtain CT to evaluate for diverticulitis.

## 2022-08-08 NOTE — Progress Notes (Signed)
Acute Office Visit  Subjective:     Patient ID: Christy Collins, female    DOB: 31-Jan-1971, 52 y.o.   MRN: [id number]  Chief Complaint  Patient presents with   Acute Visit    Back hurting     HPI Patient is in today for ongoing urinary frequency, bilateral low back pain, lower abdominal pain, dysuria, clear vaginal discharge and vaginal burning for 4 days. She also reports a flare up in her GERD resulting in left chest pain. She was seen in urgent care 2 days ago and her UA was negative. She was treated with Fluconazole for yeast, she did not take this. Denies fevers, hematuria, vaginal bleeding, itching, shortness of breath, nausea, vomiting, changes in stool pattern, saddle numbness, incontinence of urine or stool. Has tried ibuprofen.  Review of Systems  All other systems reviewed and are negative.  Past Medical History:  Diagnosis Date   Achalasia    Anemia    Bacterial infection    Constipation    Eczema    GERD (gastroesophageal reflux disease)    Chronic    Heart murmur    light no follow up   Hiatal hernia    History of chicken pox    Ovarian cyst    PONV (postoperative nausea and vomiting)    Stricture and stenosis of esophagus    Vitiligo    Yeast infection    Past Surgical History:  Procedure Laterality Date   ABDOMINAL HYSTERECTOMY     BREAST LUMPECTOMY     DILATION AND CURETTAGE OF UTERUS     DILITATION & CURRETTAGE/HYSTROSCOPY WITH VERSAPOINT RESECTION  06/08/2012   Procedure: DILATATION & CURETTAGE/HYSTEROSCOPY WITH VERSAPOINT RESECTION;  Surgeon: Alwyn Pea, MD;  Location: [facility];  Service: Gynecology;  Laterality: N/A;  and novasure   ESOPHAGEAL MANOMETRY N/A 09/17/2012   Procedure: ESOPHAGEAL MANOMETRY (EM);  Surgeon: Sable Feil, MD;  Location: [facility];  Service: Endoscopy;  Laterality: N/A;   ESOPHAGOGASTRODUODENOSCOPY N/A 11/05/2012   Procedure: ESOPHAGOGASTRODUODENOSCOPY (EGD);  Surgeon: Odis Hollingshead, MD;  Location: [facility];   Service: General;  Laterality: N/A;   HELLER MYOTOMY N/A 11/05/2012   Procedure: LAPAROSCOPIC ESOPHAGEAL MYOTOMY AND ANTERIOR FUNDOPLICATION;  Surgeon: Odis Hollingshead, MD;  Location: [facility];  Service: General;  Laterality: N/A;   ROBOTIC ASSISTED TOTAL HYSTERECTOMY N/A 06/20/2019   Procedure: XI ROBOTIC ASSISTED TOTAL HYSTERECTOMY. BILATERAL SALPINGECTOMY;  Surgeon: Delsa Bern, MD;  [facility];  Service: Gynecology;  Laterality: N/A;   TONSILLECTOMY     TUBAL LIGATION     UTERINE FIBROID SURGERY     Current Outpatient Medications on File Prior to Visit  Medication Sig Dispense Refill   BIOTIN 5000 PO Take by mouth.     estradiol (VIVELLE-DOT) 0.075 MG/24HR Place 1 patch onto the skin once a week.     fluconazole (DIFLUCAN) 150 MG tablet Take 1 tablet (150 mg total) by mouth daily. 1 tablet by mouth, if no improvement after 72 hours may take second tablet by mouth 2 tablet 0   ibuprofen (ADVIL) 400 MG tablet Take 400 mg by mouth every 6 (six) hours as needed.     meloxicam (MOBIC) 15 MG tablet Take 1 tablet (15 mg total) by mouth daily. 30 tablet 0   Omega-3 Fatty Acids (FISH OIL OMEGA-3 PO) Take by mouth.     ondansetron (ZOFRAN) 4 MG tablet Take 1 tablet (4 mg total) by mouth as directed for 2 doses. 2  tablet 0   [DISCONTINUED] Na Sulfate-K Sulfate-Mg Sulf 17.5-3.13-1.6 GM/177ML SOLN Take 1 kit by mouth as directed. May use generic Suprep, no prior authorization. Take as directed. 354 mL 0   No current facility-administered medications on file prior to visit.   Allergies  Allergen Reactions   Other     Patient states that she cant take any pain meds and if she is put to sleep it makes her sick.        Objective:    BP 120/72   Pulse 69   Temp 98.2 F (36.8 C) (Oral)   Ht '5\' 3"'$  (1.6 m)   Wt 136 lb (61.7 kg)   LMP 05/16/2019 (Exact Date)   SpO2 99%   BMI 24.09 kg/m    Physical Exam Vitals and nursing note reviewed.  Constitutional:       Appearance: Normal appearance. She is normal weight.  HENT:     Head: Normocephalic and atraumatic.  Abdominal:     General: Bowel sounds are normal.     Tenderness: There is abdominal tenderness in the suprapubic area and left lower quadrant. There is no right CVA tenderness or left CVA tenderness.  Skin:    General: Skin is warm and dry.  Neurological:     General: No focal deficit present.     Mental Status: She is alert and oriented to person, place, and time. Mental status is at baseline.  Psychiatric:        Mood and Affect: Mood normal.        Behavior: Behavior normal.        Thought Content: Thought content normal.        Judgment: Judgment normal.     Results for orders placed or performed in visit on 08/08/22  WET PREP FOR [place], YEAST, CLUE   Specimen: Genital  Result Value Ref Range   Source: GENITAL    RESULT    Urinalysis, Routine w reflex microscopic  Result Value Ref Range   Color, Urine YELLOW YELLOW   APPearance CLOUDY (A) CLEAR   Specific Gravity, Urine 1.035 1.001 - 1.035   pH 6.0 5.0 - 8.0   Glucose, UA NEGATIVE NEGATIVE   Bilirubin Urine 1+ (A) NEGATIVE   Ketones, ur TRACE (A) NEGATIVE   Hgb urine dipstick NEGATIVE NEGATIVE   Protein, ur 2+ (A) NEGATIVE   Nitrite NEGATIVE NEGATIVE   Leukocytes,Ua NEGATIVE NEGATIVE   WBC, UA 0-5 0 - 5 /HPF   RBC / HPF NONE SEEN 0 - 2 /HPF   Squamous Epithelial / HPF 6-10 (A) < OR = 5 /HPF   Bacteria, UA FEW (A) NONE SEEN /HPF   Hyaline Cast NONE SEEN NONE SEEN /LPF  Microscopic Message  Result Value Ref Range   Note          Assessment & Plan:   Problem List Items Addressed This Visit       Other   Left lower quadrant abdominal pain    Patient has ongoing vague lower abdominal and lower back pain for 4 days that is overall improving. She reports it is causing her GERD to act up as well resulting in chest pain. UA, wet prep, and EKG normal. Will obtain CBC and CMP. Discussed CT scan with patient and she  would like to wait as pain is overall improving. Instructed to seek medical care if symptoms persist or worsen and proceed to ED for severe abdominal pain, nausea, vomiting, inability to tolerate PO intake,  chest pain, shortness of breath. If pain worsens will obtain CT to evaluate for diverticulitis.       Other Visit Diagnoses     Dysuria    -  Primary   Relevant Orders   Urinalysis, Routine w reflex microscopic (Completed)   Vaginal burning       Relevant Orders   WET PREP FOR [place], YEAST, CLUE (Completed)   Other chest pain       Relevant Orders   EKG 12-Lead (Completed)       No orders of the defined types were placed in this encounter.   Return if symptoms worsen or fail to improve.  Rubie Maid, FNP

## 2022-08-09 LAB — CBC
HCT: 36.8 % (ref 35.0–45.0)
Hemoglobin: 12.3 g/dL (ref 11.7–15.5)
MCH: 28.8 pg (ref 27.0–33.0)
MCHC: 33.4 g/dL (ref 32.0–36.0)
MCV: 86.2 fL (ref 80.0–100.0)
MPV: 10.2 fL (ref 7.5–12.5)
Platelets: 254 10*3/uL (ref 140–400)
RBC: 4.27 10*6/uL (ref 3.80–5.10)
RDW: 11.8 % (ref 11.0–15.0)
WBC: 5.3 10*3/uL (ref 3.8–10.8)

## 2022-08-09 LAB — COMPLETE METABOLIC PANEL WITHOUT GFR
AG Ratio: 1.7 (calc) (ref 1.0–2.5)
ALT: 13 U/L (ref 6–29)
AST: 13 U/L (ref 10–35)
Albumin: 4.2 g/dL (ref 3.6–5.1)
Alkaline phosphatase (APISO): 22 U/L — ABNORMAL LOW (ref 37–153)
BUN: 14 mg/dL (ref 7–25)
CO2: 28 mmol/L (ref 20–32)
Calcium: 8.9 mg/dL (ref 8.6–10.4)
Chloride: 102 mmol/L (ref 98–110)
Creat: 1.02 mg/dL (ref 0.50–1.03)
Globulin: 2.5 g/dL (ref 1.9–3.7)
Glucose, Bld: 99 mg/dL (ref 65–99)
Potassium: 3.7 mmol/L (ref 3.5–5.3)
Sodium: 138 mmol/L (ref 135–146)
Total Bilirubin: 0.3 mg/dL (ref 0.2–1.2)
Total Protein: 6.7 g/dL (ref 6.1–8.1)
eGFR: 67 mL/min/1.73m2

## 2022-09-05 ENCOUNTER — Encounter: Payer: Self-pay | Admitting: Family Medicine

## 2022-09-05 ENCOUNTER — Ambulatory Visit (INDEPENDENT_AMBULATORY_CARE_PROVIDER_SITE_OTHER): Payer: BC Managed Care – PPO | Admitting: Family Medicine

## 2022-09-05 VITALS — BP 120/90 | HR 82 | Temp 97.5°F | Ht 63.0 in | Wt 132.0 lb

## 2022-09-05 DIAGNOSIS — J0110 Acute frontal sinusitis, unspecified: Secondary | ICD-10-CM | POA: Diagnosis not present

## 2022-09-05 MED ORDER — AMOXICILLIN-POT CLAVULANATE 500-125 MG PO TABS: 1.0000 | ORAL_TABLET | Freq: Three times a day (TID) | ORAL | 0 refills | Status: AC

## 2022-09-05 NOTE — Progress Notes (Signed)
Acute Office Visit  Subjective:     Patient ID: Christy Collins, female    DOB: [birth year], 52 y.o.   MRN: [id number]  Chief Complaint  Patient presents with   Sinusitis    Sinusitis   Patient is in today for 2 weeks of malodorous mucus drainage, sinus pressure, right ear pain, nasal congestion, chills, and malaise Denies fever, cough, shortness of breath, wheezing, or sick exposures Has tried Ibuprofen, Advil cold and congestion   Review of Systems  All other systems reviewed and are negative.   Past Medical History:  Diagnosis Date   Achalasia    Anemia    Bacterial infection    Constipation    Eczema    GERD (gastroesophageal reflux disease)    Chronic    Heart murmur    light no follow up   Hiatal hernia    History of chicken pox    Ovarian cyst    PONV (postoperative nausea and vomiting)    Stricture and stenosis of esophagus    Vitiligo    Yeast infection    Past Surgical History:  Procedure Laterality Date   ABDOMINAL HYSTERECTOMY     BREAST LUMPECTOMY     DILATION AND CURETTAGE OF UTERUS     DILITATION & CURRETTAGE/HYSTROSCOPY WITH VERSAPOINT RESECTION  06/08/2012   Procedure: DILATATION & CURETTAGE/HYSTEROSCOPY WITH VERSAPOINT RESECTION;  Surgeon: Alwyn Pea, MD;  Location: [place] [facility];  Service: Gynecology;  Laterality: N/A;  and novasure   ESOPHAGEAL MANOMETRY N/A 09/17/2012   Procedure: ESOPHAGEAL MANOMETRY (EM);  Surgeon: Sable Feil, MD;  Location: [facility];  Service: Endoscopy;  Laterality: N/A;   ESOPHAGOGASTRODUODENOSCOPY N/A 11/05/2012   Procedure: ESOPHAGOGASTRODUODENOSCOPY (EGD);  Surgeon: Odis Hollingshead, MD;  Location: [facility];  Service: General;  Laterality: N/A;   HELLER MYOTOMY N/A 11/05/2012   Procedure: LAPAROSCOPIC ESOPHAGEAL MYOTOMY AND ANTERIOR FUNDOPLICATION;  Surgeon: Odis Hollingshead, MD;  Location: [facility];  Service: General;  Laterality: N/A;   ROBOTIC ASSISTED TOTAL HYSTERECTOMY N/A 06/20/2019   Procedure: XI  ROBOTIC ASSISTED TOTAL HYSTERECTOMY. BILATERAL SALPINGECTOMY;  Surgeon: Delsa Bern, MD;  Location: [facility];  Service: Gynecology;  Laterality: N/A;   TONSILLECTOMY     TUBAL LIGATION     UTERINE FIBROID SURGERY     Christy Collins Allergies  Allergen Reactions   Other     Patient states that she cant take any pain meds and if she is put to sleep it makes her sick.        Objective:    BP (!) 120/90   Pulse 82   Temp (!) 97.5 F (36.4 C) (Oral)   Ht '5\' 3"'$  (1.6 m)   Wt 132 lb (59.9 kg)   LMP 05/16/2019 (Exact Date)   SpO2 98%   BMI 23.38 kg/m    Physical Exam Vitals and nursing note reviewed.  Constitutional:      Appearance: Normal appearance. She is normal weight.  HENT:     Head: Normocephalic and atraumatic.     Right Ear: Tympanic membrane, ear canal and external ear normal.     Left Ear: Tympanic membrane, ear canal and external ear normal.     Nose: Congestion present.     Right Sinus: Frontal sinus tenderness present. No maxillary sinus tenderness.     Left Sinus: Frontal sinus tenderness present. No maxillary sinus tenderness.     Mouth/Throat:     Mouth: Mucous membranes are moist.  Pharynx: Oropharynx is clear.  Eyes:     Conjunctiva/sclera: Conjunctivae normal.  Cardiovascular:     Rate and Rhythm: Normal rate and regular rhythm.     Pulses: Normal pulses.     Heart sounds: Normal heart sounds.  Pulmonary:     Effort: Pulmonary effort is normal.     Breath sounds: Normal breath sounds.  Musculoskeletal:     Cervical back: No tenderness.  Lymphadenopathy:     Cervical: No cervical adenopathy.  Skin:    General: Skin is warm and dry.  Neurological:     General: No focal deficit present.     Mental Status: She is alert and oriented to person, place, and time. Mental status is at baseline.  Psychiatric:        Mood and Affect: Mood normal.        Behavior: Behavior normal.        Thought Content: Thought content normal.         Judgment: Judgment normal.     No results found for any visits on 09/05/22.      Assessment & Plan:   Problem List Items Addressed This Visit       Respiratory   Acute non-recurrent frontal sinusitis - Primary    Symptoms consistent with sinusitis, will treat with Augmentin 500-125 TID for 5 days given duration of symptoms. May continue symptomatic management. Return to office if symptoms persist or worsen.      Relevant Medications   amoxicillin-clavulanate (AUGMENTIN) 500-125 MG tablet    Meds ordered this encounter  Medications   amoxicillin-clavulanate (AUGMENTIN) 500-125 MG tablet    Sig: Take 1 tablet by mouth 3 (three) times daily for 5 days.    Dispense:  15 tablet    Refill:  0    Order Specific Question:   Supervising Provider    Answer:   Jenna Luo T [id number]    Return if symptoms worsen or fail to improve.  Rubie Maid, FNP

## 2022-09-05 NOTE — Assessment & Plan Note (Signed)
Symptoms consistent with sinusitis, will treat with Augmentin 500-125 TID for 5 days given duration of symptoms. May continue symptomatic management. Return to office if symptoms persist or worsen.

## 2022-09-20 ENCOUNTER — Inpatient Hospital Stay: Admission: RE | Admit: 2022-09-20 | Payer: BC Managed Care – PPO | Source: Ambulatory Visit

## 2022-10-12 ENCOUNTER — Ambulatory Visit
Admission: RE | Admit: 2022-10-12 | Discharge: 2022-10-12 | Disposition: A | Discharge status: Home / Self Care | Payer: BC Managed Care – PPO | Source: Ambulatory Visit | Attending: Family Medicine | Admitting: Family Medicine

## 2022-10-12 DIAGNOSIS — Z1231 Encounter for screening mammogram for malignant neoplasm of breast: Secondary | ICD-10-CM

## 2022-11-07 ENCOUNTER — Encounter: Payer: Self-pay | Admitting: Family Medicine

## 2022-11-07 ENCOUNTER — Ambulatory Visit (INDEPENDENT_AMBULATORY_CARE_PROVIDER_SITE_OTHER): Payer: BC Managed Care – PPO | Admitting: Family Medicine

## 2022-11-07 VITALS — BP 128/78 | HR 67 | Temp 97.6°F | Ht 63.0 in | Wt 130.2 lb

## 2022-11-07 DIAGNOSIS — M7582 Other shoulder lesions, left shoulder: Secondary | ICD-10-CM

## 2022-11-07 MED ORDER — MELOXICAM 15 MG PO TABS: 15.0000 mg | ORAL_TABLET | Freq: Every day | ORAL | 2 refills | Status: DC

## 2022-11-07 NOTE — Progress Notes (Signed)
Subjective:    Patient ID: Christy Collins, female    DOB: 07-28-1970, 52 y.o.   MRN: [id number] Patient is a very sweet 52 year old African-American female who presents with left shoulder pain.  Is been going on for several months but is getting worse.  She reports pain with abduction greater than 50 degrees.  Is difficult for her to reach above her head to comb her hair, put on her close, or do overhead activities.  It hurts to sleep on her shoulder at night.  It aches and throbs.  On examination today there is no crepitus in the shoulder with passive range of motion.  She has pain with abduction greater than 100 degrees.  She has pain with empty can testing.  She has minimal pain with Hawkins maneuver.  She has good strength with resisted abduction. Past Medical History:  Diagnosis Date   Achalasia    Anemia    Bacterial infection    Constipation    Eczema    GERD (gastroesophageal reflux disease)    Chronic    Heart murmur    light no follow up   Hiatal hernia    History of chicken pox    Ovarian cyst    PONV (postoperative nausea and vomiting)    Stricture and stenosis of esophagus    Vitiligo    Yeast infection    Past Surgical History:  Procedure Laterality Date   ABDOMINAL HYSTERECTOMY     BREAST LUMPECTOMY     DILATION AND CURETTAGE OF UTERUS     DILITATION & CURRETTAGE/HYSTROSCOPY WITH VERSAPOINT RESECTION  06/08/2012   Procedure: DILATATION & CURETTAGE/HYSTEROSCOPY WITH VERSAPOINT RESECTION;  Surgeon: Esmeralda Arthur, MD;  Location: [facility];  Service: Gynecology;  Laterality: N/A;  and novasure   ESOPHAGEAL MANOMETRY N/A 09/17/2012   Procedure: ESOPHAGEAL MANOMETRY (EM);  Surgeon: Mardella Layman, MD;  Location: [facility];  Service: Endoscopy;  Laterality: N/A;   ESOPHAGOGASTRODUODENOSCOPY N/A 11/05/2012   Procedure: ESOPHAGOGASTRODUODENOSCOPY (EGD);  Surgeon: Adolph Pollack, MD;  Location: [facility];  Service: General;  Laterality: N/A;   HELLER MYOTOMY N/A 11/05/2012    Procedure: LAPAROSCOPIC ESOPHAGEAL MYOTOMY AND ANTERIOR FUNDOPLICATION;  Surgeon: Adolph Pollack, MD;  Location: [facility];  Service: General;  Laterality: N/A;   ROBOTIC ASSISTED TOTAL HYSTERECTOMY N/A 06/20/2019   Procedure: XI ROBOTIC ASSISTED TOTAL HYSTERECTOMY. BILATERAL SALPINGECTOMY;  Surgeon: Silverio Lay, MD;  Location: [facility];  Service: Gynecology;  Laterality: N/A;   TONSILLECTOMY     TUBAL LIGATION     UTERINE FIBROID SURGERY     Current Outpatient Medications on File Prior to Visit  Medication Sig Dispense Refill   BIOTIN 5000 PO Take by mouth.     estradiol (VIVELLE-DOT) 0.075 MG/24HR Place 1 patch onto the skin once a week.     fluconazole (DIFLUCAN) 150 MG tablet Take 1 tablet (150 mg total) by mouth daily. 1 tablet by mouth, if no improvement after 72 hours may take second tablet by mouth 2 tablet 0   ibuprofen (ADVIL) 400 MG tablet Take 400 mg by mouth every 6 (six) hours as needed.     meloxicam (MOBIC) 15 MG tablet Take 1 tablet (15 mg total) by mouth daily. 30 tablet 0   Omega-3 Fatty Acids (FISH OIL OMEGA-3 PO) Take by mouth.     [DISCONTINUED] Na Sulfate-K Sulfate-Mg Sulf 17.5-3.13-1.6 GM/177ML SOLN Take 1 kit by mouth as directed. May use generic Suprep, no prior authorization. Take as directed.  354 mL 0   No current facility-administered medications on file prior to visit.   Allergies  Allergen Reactions   Other     Patient states that she cant take any pain meds and if she is put to sleep it makes her sick.   Social History   Socioeconomic History   Marital status: Married    Spouse name: Camella Seim   Number of children: 3   Years of education: 13   Highest education level: Some college, no degree  Occupational History   Occupation: CAP CASE MANAGER    Employer: [employer]  Tobacco Use   Smoking status: Former    Packs/day: .5    Types: Cigarettes    Quit date: 11/05/2012    Years since quitting: 10.0    Passive  exposure: Past   Smokeless tobacco: Never  Vaping Use   Vaping Use: Never used  Substance and Sexual Activity   Alcohol use: Yes    Alcohol/week: 3.0 standard drinks of alcohol    Types: 3 Glasses of wine per week   Drug use: Never   Sexual activity: Not Currently    Partners: Male    Birth control/protection: Surgical  Other Topics Concern   Not on file  Social History Narrative   Not on file   Social Determinants of Health   Financial Resource Strain: Low Risk  (03/02/2022)   Overall Financial Resource Strain (CARDIA)    Difficulty of Paying Living Expenses: Not hard at all  Food Insecurity: No Food Insecurity (03/02/2022)   Hunger Vital Sign    Worried About Running Out of Food in the Last Year: Never true    Ran Out of Food in the Last Year: Never true  Transportation Needs: No Transportation Needs (03/02/2022)   PRAPARE - Administrator, Civil Service (Medical): No    Lack of Transportation (Non-Medical): No  Physical Activity: Sufficiently Active (03/02/2022)   Exercise Vital Sign    Days of Exercise per Week: 5 days    Minutes of Exercise per Session: 50 min  Stress: No Stress Concern Present (03/02/2022)   [employer] of Occupational Health - Occupational Stress Questionnaire    Feeling of Stress : Not at all  Social Connections: Socially Integrated (03/02/2022)   Social Connection and Isolation Panel [NHANES]    Frequency of Communication with Friends and Family: More than three times a week    Frequency of Social Gatherings with Friends and Family: More than three times a week    Attends Religious Services: More than 4 times per year    Active Member of [employer] or Organizations: Yes    Attends Engineer, structural: More than 4 times per year    Marital Status: Married  Catering manager Violence: Not At Risk (03/02/2022)   Humiliation, Afraid, Rape, and Kick questionnaire    Fear of Current or Ex-Partner: No    Emotionally Abused: No     Physically Abused: No    Sexually Abused: No   Family History  Problem Relation Age of Onset   Asthma Mother    Hyperlipidemia Mother    Alcohol abuse Father    Hypertension Father    Hypertension Paternal Aunt    Diabetes Paternal Aunt    Cancer Paternal Uncle    Hypertension Paternal Uncle    Diabetes Paternal Uncle    Cancer Maternal Grandmother    Asthma Daughter    Rheumatic fever Daughter    Colon  cancer Neg Hx    Colon polyps Neg Hx    Esophageal cancer Neg Hx    Rectal cancer Neg Hx    Stomach cancer Neg Hx       Review of Systems  All other systems reviewed and are negative.      Objective:   Physical Exam Vitals reviewed.  Constitutional:      General: She is not in acute distress.    Appearance: Normal appearance. She is well-developed and normal weight. She is not ill-appearing, toxic-appearing or diaphoretic.  Cardiovascular:     Rate and Rhythm: Normal rate and regular rhythm.     Heart sounds: Normal heart sounds. No murmur heard.    No friction rub. No gallop.  Pulmonary:     Effort: Pulmonary effort is normal. No respiratory distress.     Breath sounds: Normal breath sounds. No stridor. No wheezing, rhonchi or rales.  Chest:     Chest wall: No tenderness.  Musculoskeletal:     Right shoulder: No effusion, tenderness, bony tenderness or crepitus. Normal range of motion. Normal strength.     Left shoulder: No effusion, tenderness, bony tenderness or crepitus. Decreased range of motion. Decreased strength.     Right hand: Normal sensation.  Neurological:     Mental Status: She is alert.           Assessment & Plan:  Rotator cuff tendonitis, left - Plan: Ambulatory referral to Physical Therapy Patient has tendinitis of the rotator cuff and may have a partial tear.  We discussed a cortisone injection but the pain is manageable and she declines this.  She would like a refill of meloxicam 15 mg daily.  Also recommended a trial of physical  therapy.  If not improving we may need to consider an MRI to evaluate for the extent of the damage to the rotator cuff tendon to determine if she needs surgery.  Trial of conservative therapy with NSAIDs and physical therapy first

## 2022-11-16 ENCOUNTER — Ambulatory Visit (INDEPENDENT_AMBULATORY_CARE_PROVIDER_SITE_OTHER): Payer: BC Managed Care – PPO | Admitting: Physical Therapy

## 2022-11-16 ENCOUNTER — Encounter: Payer: Self-pay | Admitting: Physical Therapy

## 2022-11-16 ENCOUNTER — Other Ambulatory Visit: Payer: Self-pay

## 2022-11-16 DIAGNOSIS — M25612 Stiffness of left shoulder, not elsewhere classified: Secondary | ICD-10-CM

## 2022-11-16 DIAGNOSIS — M25512 Pain in left shoulder: Secondary | ICD-10-CM | POA: Diagnosis not present

## 2022-11-16 DIAGNOSIS — M6281 Muscle weakness (generalized): Secondary | ICD-10-CM

## 2022-11-16 DIAGNOSIS — R293 Abnormal posture: Secondary | ICD-10-CM | POA: Diagnosis not present

## 2022-11-16 DIAGNOSIS — G8929 Other chronic pain: Secondary | ICD-10-CM

## 2022-11-16 NOTE — Therapy (Signed)
OUTPATIENT PHYSICAL THERAPY EVALUATION   Patient Name: Christy Collins MRN: [id number] DOB:07-25-1970, 52 y.o., female Today's Date: 11/16/2022  END OF SESSION:  PT End of Session - 11/16/22 1339     Visit Number 1    Number of Visits 6    Date for PT Re-Evaluation 12/28/22    Authorization Type [employer] 20% coinsurance; 90 visit limit    PT Start Time 1306    PT Stop Time 1338    PT Time Calculation (min) 32 min    Activity Tolerance Patient tolerated treatment well    Behavior During Therapy WFL for tasks assessed/performed             Past Medical History:  Diagnosis Date   Achalasia    Anemia    Bacterial infection    Constipation    Eczema    GERD (gastroesophageal reflux disease)    Chronic    Heart murmur    light no follow up   Hiatal hernia    History of chicken pox    Ovarian cyst    PONV (postoperative nausea and vomiting)    Stricture and stenosis of esophagus    Vitiligo    Yeast infection    Past Surgical History:  Procedure Laterality Date   ABDOMINAL HYSTERECTOMY     BREAST LUMPECTOMY     DILATION AND CURETTAGE OF UTERUS     DILITATION & CURRETTAGE/HYSTROSCOPY WITH VERSAPOINT RESECTION  06/08/2012   Procedure: DILATATION & CURETTAGE/HYSTEROSCOPY WITH VERSAPOINT RESECTION;  Surgeon: Esmeralda Arthur, MD;  Location: [facility];  Service: Gynecology;  Laterality: N/A;  and novasure   ESOPHAGEAL MANOMETRY N/A 09/17/2012   Procedure: ESOPHAGEAL MANOMETRY (EM);  Surgeon: Mardella Layman, MD;  Location: [facility];  Service: Endoscopy;  Laterality: N/A;   ESOPHAGOGASTRODUODENOSCOPY N/A 11/05/2012   Procedure: ESOPHAGOGASTRODUODENOSCOPY (EGD);  Surgeon: Adolph Pollack, MD;  Location: [facility];  Service: General;  Laterality: N/A;   HELLER MYOTOMY N/A 11/05/2012   Procedure: LAPAROSCOPIC ESOPHAGEAL MYOTOMY AND ANTERIOR FUNDOPLICATION;  Surgeon: Adolph Pollack, MD;  Location: [facility];  Service: General;  Laterality: N/A;   ROBOTIC ASSISTED TOTAL HYSTERECTOMY  N/A 06/20/2019   Procedure: XI ROBOTIC ASSISTED TOTAL HYSTERECTOMY. BILATERAL SALPINGECTOMY;  Surgeon: Silverio Lay, MD;  Location: [facility];  Service: Gynecology;  Laterality: N/A;   TONSILLECTOMY     TUBAL LIGATION     UTERINE FIBROID SURGERY     Patient Active Problem List   Diagnosis Date Noted   Acute non-recurrent frontal sinusitis 09/05/2022   Left lower quadrant abdominal pain 08/08/2022   Essential hypertension 12/23/2020   Menorrhagia 06/20/2019   Eczema 04/19/2018   Hiatal hernia 04/19/2018   Uterine leiomyoma 04/19/2018   Vitiligo    ACHALASIA s/p laparoscopic Heller myotomy and Dor Fundoplication 04/29/2008   ESOPHAGEAL STRICTURE 04/24/2008   GERD 04/24/2008   CONSTIPATION 04/24/2008    PCP: Lynnea Ferrier MD  REFERRING PROVIDER: Donita Brooks, MD  REFERRING DIAG: [phone] (ICD-10-CM) - Rotator cuff tendonitis, left  Rationale for Evaluation and Treatment: Rehabilitation  THERAPY DIAG:  Chronic left shoulder pain - Plan: PT plan of care cert/re-cert  Stiffness of left shoulder, not elsewhere classified - Plan: PT plan of care cert/re-cert  Muscle weakness (generalized) - Plan: PT plan of care cert/re-cert  Abnormal posture - Plan: PT plan of care cert/re-cert  ONSET DATE: 11/07/22 (referral date); symptoms started in Dec 2023   SUBJECTIVE:  SUBJECTIVE STATEMENT: Pt reports about a 5 month history of Lt shoulder pain.  She reports difficulty with overhead activity that has continued for at least 5 months.  She is Rt handed  PERTINENT HISTORY:  anemia  PAIN:  Are you having pain? Yes: NPRS scale: 0 currently, up to 7/10 Pain location: Lt shoulder; radiates into bicep Pain description: sharp and quick, throbbing Aggravating factors: overhead  activities, reaching back, short lived Relieving factors: avoiding provoking positions  PRECAUTIONS: None  WEIGHT BEARING RESTRICTIONS: No  FALLS:  Has patient fallen in last 6 months? 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  PLOF: Independent and Leisure: gardening, drink a glass of wine, reading, walk on treadmill every morning x 15 min  PATIENT GOALS: move shoulder without hurting  NEXT MD VISIT: PRN   OBJECTIVE:   DIAGNOSTIC FINDINGS:  MRI if no improvement  PATIENT SURVEYS:  11/16/22: FOTO 53 (predicted 72)  COGNITIVE STATUS: Within functional limits for tasks assessed   SENSATION: WFL  POSTURE:  rounded shoulders and forward head  GAIT: 11/16/22: Comments: independent   Body Part #1 Shoulder  PALPATION: 11/16/22: Mild tenderness ant shoulder; biceps tendon; lateral shoulder  UPPER EXTREMITY ROM:  ROM Right eval Left eval  Shoulder flexion 160 135/150  Shoulder extension    Shoulder abduction 180 92/100  Shoulder adduction    Shoulder extension    Shoulder internal rotation FIR to T8 FIR to Lt iliac crest  Shoulder external rotation FER to T5 FER to T3   (Blank rows = not tested)    UPPER EXTREMITY MMT:  MMT Right eval Left eval  Shoulder flexion 5/5 5/5  Shoulder extension    Shoulder abduction 5/5 3+/5  Shoulder adduction    Shoulder extension    Shoulder internal rotation 5/5 5/5  Shoulder external rotation 4/5 3/5  Elbow flexion  5/5  Grip strength     (Blank rows = not tested)   SPECIAL TESTS:  11/16/22: Upper Extremity Impingement tests: Hawkins/Kennedy impingement test: negative and Rotator cuff assessment: Empty can test: positive  and Full can test: negative   TREATMENT:                                                                                                                              DATE:   11/16/22  See HEP - demonstrated with pt performing trial reps PRN with mod cues for technique   PATIENT EDUCATION:  Education details: HEP Person educated: Patient Education method: Programmer, multimedia, Facilities manager, and Handouts Education comprehension: verbalized understanding, returned demonstration, and needs further education  HOME EXERCISE PROGRAM: Access Code: [email or web address] URL: [email or web address] Date: 11/16/2022 Prepared by: Moshe Cipro  Exercises - Seated Scapular Retraction  - 2 x daily - 7 x weekly - 1 sets - 10 reps - 5 sec hold - Standing Backward Shoulder Rolls  -  2 x daily - 7 x weekly - 1 sets - 10 reps - Shoulder External Rotation and Scapular Retraction with Resistance  - 2 x daily - 7 x weekly - 1 sets - 10 reps - 5 sec hold - Standing Shoulder Row with Anchored Resistance  - 2 x daily - 7 x weekly - 1 sets - 10 reps - 5 sec hold hold   ASSESSMENT:  CLINICAL IMPRESSION: Patient is a 52 y.o. female who was seen today for physical therapy evaluation and treatment for Lt shoulder pain.  She demonstrates decreased ROM and strength as well as postural abnormalities and continued pain affecting function.  She will benefit from PT to address deficits listed.   OBJECTIVE IMPAIRMENTS: decreased ROM, decreased strength, increased fascial restrictions, impaired UE functional use, postural dysfunction, and pain.   ACTIVITY LIMITATIONS: carrying, lifting, bathing, toileting, dressing, reach over head, and hygiene/grooming  PARTICIPATION LIMITATIONS: meal prep, cleaning, laundry, driving, shopping, community activity, and occupation  PERSONAL FACTORS: Time since onset of injury/illness/exacerbation and 1 comorbidity: anemia  are also affecting patient's functional outcome.   REHAB POTENTIAL: Good  CLINICAL DECISION MAKING: Evolving/moderate complexity  EVALUATION COMPLEXITY: Moderate   GOALS: Goals reviewed with patient? Yes  SHORT TERM GOALS: Target  date: 12/07/2022  Independent with initial HEP Goal status: INITIAL   LONG TERM GOALS: Target date: 12/28/2022  Independent with final HEP Goal status: INITIAL  2.  FOTO score improved to 72 Goal status: INITIAL  3.  Lt shoulder functional internal rotation improved to T10 for improved ability to complete ADLs Goal status: INIITAL  4.  Report pain < 3/10 with overhead activities for improved function Goal status: INITIAL  5.  Demonstrate Lt shoulder strength of at least 4/5 for improved function Goal status: INITIAL   PLAN:  PT FREQUENCY: 1x/week  PT DURATION: 6 weeks  PLANNED INTERVENTIONS: Therapeutic exercises, Therapeutic activity, Neuromuscular re-education, Patient/Family education, Self Care, Joint mobilization, Joint manipulation, Aquatic Therapy, Dry Needling, Electrical stimulation, Cryotherapy, Moist heat, Taping, Ultrasound, Ionotophoresis 4mg /ml Dexamethasone, Manual therapy, and Re-evaluation.  PLAN FOR NEXT SESSION: review HEP, continue postural strengthening; light strengthening as able and ROM   Clarita Crane, PT, DPT 11/16/22 1:44 PM

## 2022-11-28 ENCOUNTER — Ambulatory Visit (INDEPENDENT_AMBULATORY_CARE_PROVIDER_SITE_OTHER): Payer: BC Managed Care – PPO | Admitting: Physical Therapy

## 2022-11-28 ENCOUNTER — Encounter: Payer: Self-pay | Admitting: Physical Therapy

## 2022-11-28 DIAGNOSIS — R293 Abnormal posture: Secondary | ICD-10-CM | POA: Diagnosis not present

## 2022-11-28 DIAGNOSIS — M25512 Pain in left shoulder: Secondary | ICD-10-CM

## 2022-11-28 DIAGNOSIS — M25612 Stiffness of left shoulder, not elsewhere classified: Secondary | ICD-10-CM | POA: Diagnosis not present

## 2022-11-28 DIAGNOSIS — G8929 Other chronic pain: Secondary | ICD-10-CM

## 2022-11-28 DIAGNOSIS — M6281 Muscle weakness (generalized): Secondary | ICD-10-CM | POA: Diagnosis not present

## 2022-11-28 NOTE — Therapy (Addendum)
OUTPATIENT PHYSICAL THERAPY TREATMENT DISCHARGE SUMMARY   Patient Name: Christy Collins MRN: [id number] DOB:25-Dec-1970, 52 y.o., female Today's Date: 11/28/2022  END OF SESSION:  PT End of Session - 11/28/22 0855     Visit Number 2    Number of Visits 6    Date for PT Re-Evaluation 12/28/22    Authorization Type [employer] 20% coinsurance; 90 visit limit    PT Start Time 0855   pt arrived late   PT Stop Time 0924    PT Time Calculation (min) 29 min    Activity Tolerance Patient tolerated treatment well    Behavior During Therapy WFL for tasks assessed/performed              Past Medical History:  Diagnosis Date   Achalasia    Anemia    Bacterial infection    Constipation    Eczema    GERD (gastroesophageal reflux disease)    Chronic    Heart murmur    light no follow up   Hiatal hernia    History of chicken pox    Ovarian cyst    PONV (postoperative nausea and vomiting)    Stricture and stenosis of esophagus    Vitiligo    Yeast infection    Past Surgical History:  Procedure Laterality Date   ABDOMINAL HYSTERECTOMY     BREAST LUMPECTOMY     DILATION AND CURETTAGE OF UTERUS     DILITATION & CURRETTAGE/HYSTROSCOPY WITH VERSAPOINT RESECTION  06/08/2012   Procedure: DILATATION & CURETTAGE/HYSTEROSCOPY WITH VERSAPOINT RESECTION;  Surgeon: Esmeralda Arthur, MD;  Location: [facility];  Service: Gynecology;  Laterality: N/A;  and novasure   ESOPHAGEAL MANOMETRY N/A 09/17/2012   Procedure: ESOPHAGEAL MANOMETRY (EM);  Surgeon: Mardella Layman, MD;  Location: [facility];  Service: Endoscopy;  Laterality: N/A;   ESOPHAGOGASTRODUODENOSCOPY N/A 11/05/2012   Procedure: ESOPHAGOGASTRODUODENOSCOPY (EGD);  Surgeon: Adolph Pollack, MD;  Location: [facility];  Service: General;  Laterality: N/A;   HELLER MYOTOMY N/A 11/05/2012   Procedure: LAPAROSCOPIC ESOPHAGEAL MYOTOMY AND ANTERIOR FUNDOPLICATION;  Surgeon: Adolph Pollack, MD;  Location: [facility];  Service: General;  Laterality: N/A;    ROBOTIC ASSISTED TOTAL HYSTERECTOMY N/A 06/20/2019   Procedure: XI ROBOTIC ASSISTED TOTAL HYSTERECTOMY. BILATERAL SALPINGECTOMY;  Surgeon: Silverio Lay, MD;  Location: [facility];  Service: Gynecology;  Laterality: N/A;   TONSILLECTOMY     TUBAL LIGATION     UTERINE FIBROID SURGERY     Patient Active Problem List   Diagnosis Date Noted   Acute non-recurrent frontal sinusitis 09/05/2022   Left lower quadrant abdominal pain 08/08/2022   Essential hypertension 12/23/2020   Menorrhagia 06/20/2019   Eczema 04/19/2018   Hiatal hernia 04/19/2018   Uterine leiomyoma 04/19/2018   Vitiligo    ACHALASIA s/p laparoscopic Heller myotomy and Dor Fundoplication 04/29/2008   ESOPHAGEAL STRICTURE 04/24/2008   GERD 04/24/2008   CONSTIPATION 04/24/2008    PCP: Lynnea Ferrier MD  REFERRING PROVIDER: Donita Brooks, MD  REFERRING DIAG: [phone] (ICD-10-CM) - Rotator cuff tendonitis, left  Rationale for Evaluation and Treatment: Rehabilitation  THERAPY DIAG:  Chronic left shoulder pain  Stiffness of left shoulder, not elsewhere classified  Muscle weakness (generalized)  Abnormal posture  ONSET DATE: 11/07/22 (referral date); symptoms started in Dec 2023   SUBJECTIVE:  SUBJECTIVE STATEMENT: Feels about the same; exercises are compliant.    PERTINENT HISTORY:  anemia  PAIN:  Are you having pain? Yes: NPRS scale: 2 currently, up to 7/10 Pain location: Lt shoulder; radiates into bicep Pain description: sharp and quick, throbbing Aggravating factors: overhead activities, reaching back, short lived Relieving factors: avoiding provoking positions  PRECAUTIONS: None  WEIGHT BEARING RESTRICTIONS: No  FALLS:  Has patient fallen in last 6 months? 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  PLOF: Independent and Leisure: gardening, drink a glass of wine, reading, walk on treadmill every morning x 15 min  PATIENT GOALS: move shoulder without hurting  NEXT MD VISIT: PRN   OBJECTIVE:   DIAGNOSTIC FINDINGS:  MRI if no improvement  PATIENT SURVEYS:  11/16/22: FOTO 53 (predicted 72)  COGNITIVE STATUS: Within functional limits for tasks assessed   SENSATION: WFL  POSTURE:  rounded shoulders and forward head  GAIT: 11/16/22: Comments: independent   Body Part #1 Shoulder  PALPATION: 11/16/22: Mild tenderness ant shoulder; biceps tendon; lateral shoulder  UPPER EXTREMITY ROM:  ROM Right eval Left eval  Shoulder flexion 160 135/150  Shoulder extension    Shoulder abduction 180 92/100  Shoulder adduction    Shoulder extension    Shoulder internal rotation FIR to T8 FIR to Lt iliac crest  Shoulder external rotation FER to T5 FER to T3   (Blank rows = not tested)    UPPER EXTREMITY MMT:  MMT Right eval Left eval  Shoulder flexion 5/5 5/5  Shoulder extension    Shoulder abduction 5/5 3+/5  Shoulder adduction    Shoulder extension    Shoulder internal rotation 5/5 5/5  Shoulder external rotation 4/5 3/5  Elbow flexion  5/5  Grip strength     (Blank rows = not tested)   SPECIAL TESTS:  11/16/22: Upper Extremity Impingement tests: Hawkins/Kennedy impingement test: negative and Rotator cuff assessment: Empty can test: positive  and Full can test: negative   TREATMENT:                                                                                                                              DATE:  11/28/22 TherEx Pulleys flexion and scaption x 3 min each UBE L2 x 6 min (3 min each direction) Scapular retraction 10 x 5 sec hold Shoulder rolls backward x 15 reps Seated ER with scapular retraction L2 band x 10 reps Rows  L2 2x10; 2 sec hold Shoulder extension L2 band 2x10 Standing ER L2 band 2x10 Standing IR L2 band 2x10  11/16/22  See HEP - demonstrated with pt performing trial reps PRN with mod cues for technique   PATIENT EDUCATION:  Education details: HEP Person educated: Patient Education method: Explanation, Demonstration, and Handouts Education comprehension: verbalized understanding, returned demonstration, and needs further education  HOME EXERCISE PROGRAM:  Access Code: [email or web address] URL: [email or web address] Date: 11/16/2022 Prepared by: Moshe Cipro  Exercises - Seated Scapular Retraction  - 2 x daily - 7 x weekly - 1 sets - 10 reps - 5 sec hold - Standing Backward Shoulder Rolls  - 2 x daily - 7 x weekly - 1 sets - 10 reps - Shoulder External Rotation and Scapular Retraction with Resistance  - 2 x daily - 7 x weekly - 1 sets - 10 reps - 5 sec hold - Standing Shoulder Row with Anchored Resistance  - 2 x daily - 7 x weekly - 1 sets - 10 reps - 5 sec hold hold   ASSESSMENT:  CLINICAL IMPRESSION: Pt tolerated session well needing min cues for review of HEP but overall good compliance.  Able to progress some exercises in the clinic today.  Will continue to benefit from PT to maximize function.  OBJECTIVE IMPAIRMENTS: decreased ROM, decreased strength, increased fascial restrictions, impaired UE functional use, postural dysfunction, and pain.   ACTIVITY LIMITATIONS: carrying, lifting, bathing, toileting, dressing, reach over head, and hygiene/grooming  PARTICIPATION LIMITATIONS: meal prep, cleaning, laundry, driving, shopping, community activity, and occupation  PERSONAL FACTORS: Time since onset of injury/illness/exacerbation and 1 comorbidity: anemia  are also affecting patient's functional outcome.   REHAB POTENTIAL: Good  CLINICAL DECISION MAKING: Evolving/moderate complexity  EVALUATION COMPLEXITY: Moderate   GOALS: Goals reviewed with patient? Yes  SHORT  TERM GOALS: Target date: 12/07/2022  Independent with initial HEP Goal status: INITIAL   LONG TERM GOALS: Target date: 12/28/2022  Independent with final HEP Goal status: INITIAL  2.  FOTO score improved to 72 Goal status: INITIAL  3.  Lt shoulder functional internal rotation improved to T10 for improved ability to complete ADLs Goal status: INIITAL  4.  Report pain < 3/10 with overhead activities for improved function Goal status: INITIAL  5.  Demonstrate Lt shoulder strength of at least 4/5 for improved function Goal status: INITIAL   PLAN:  PT FREQUENCY: 1x/week  PT DURATION: 6 weeks  PLANNED INTERVENTIONS: Therapeutic exercises, Therapeutic activity, Neuromuscular re-education, Patient/Family education, Self Care, Joint mobilization, Joint manipulation, Aquatic Therapy, Dry Needling, Electrical stimulation, Cryotherapy, Moist heat, Taping, Ultrasound, Ionotophoresis 4mg /ml Dexamethasone, Manual therapy, and Re-evaluation.  PLAN FOR NEXT SESSION:  review HEP PRN, continue postural strengthening; light strengthening as able and ROM; add additional band exercises to HEP PRN   Clarita Crane, PT, DPT 11/28/22 9:24 AM   PHYSICAL THERAPY DISCHARGE SUMMARY  Visits from Start of Care: 2  Current functional level related to goals / functional outcomes: See above   Remaining deficits: See above   Education / Equipment: HEP   Patient agrees to discharge. Patient goals were not met. Patient is being discharged due to not returning since the last visit.  Clarita Crane, PT, DPT 12/28/22 1:08 PM  [facility] [address] [place], [place], [address] Phone: [phone]   Fax:  [phone]

## 2022-12-07 ENCOUNTER — Encounter: Payer: BC Managed Care – PPO | Admitting: Physical Therapy

## 2022-12-13 ENCOUNTER — Encounter: Payer: BC Managed Care – PPO | Admitting: Physical Therapy

## 2023-01-31 ENCOUNTER — Encounter: Payer: Self-pay | Admitting: Family Medicine

## 2023-02-22 ENCOUNTER — Encounter: Payer: Self-pay | Admitting: Family Medicine

## 2023-02-22 ENCOUNTER — Ambulatory Visit (INDEPENDENT_AMBULATORY_CARE_PROVIDER_SITE_OTHER): Payer: BC Managed Care – PPO | Admitting: Family Medicine

## 2023-02-22 VITALS — HR 77 | Temp 98.2°F | Ht 63.0 in | Wt 138.0 lb

## 2023-02-22 DIAGNOSIS — K219 Gastro-esophageal reflux disease without esophagitis: Secondary | ICD-10-CM | POA: Diagnosis not present

## 2023-02-22 MED ORDER — OMEPRAZOLE 20 MG PO CPDR: 20.0000 mg | DELAYED_RELEASE_CAPSULE | Freq: Every day | ORAL | 1 refills | Status: DC

## 2023-02-22 NOTE — Assessment & Plan Note (Signed)
The pathophysiology of reflux is discussed.  Anti-reflux measures such as raising the head of the bed, avoiding tight clothing or belts, avoiding eating late at night and not lying down shortly after mealtime and achieving weight loss are discussed. Avoid ASA, NSAID's, caffeine, peppermints, alcohol and tobacco. OTC H2 blockers and/or antacids are often very helpful for PRN use. However, for persisting chronic or daily symptoms, prescription strength H2 blockers or a trial of PPI's are often used. FShe should alert me if there are persistent symptoms, dysphagia, weight loss or GI bleeding. Follow up if symptoms worse or do not improve in 1 week.

## 2023-02-22 NOTE — Progress Notes (Signed)
Subjective:  HPI: Christy Collins is a 52 y.o. female presenting on 02/22/2023 for Follow-up (Nail infection and stomach issues/)   HPI Patient is in today for worsening reflux symptoms including heartburn, worse at night, and painful swallowing. No dietary changes. She does drink some wine, she uses an air fryer and cooks foods from her garden, she avoids spicy foods and caffeine. Is not eating late at night or large meals. Denies nausea or vomiting. Has tried Catering manager.  She is also concerned about a fungal infection of her nail. She has been soaking her nails and applying various creams without relief. She does have gel nails in office today so I asked her to send a photo of the nail for further evaluation and treatment options.  Review of Systems  All other systems reviewed and are negative.   Relevant past medical history reviewed and updated as indicated.   Past Medical History:  Diagnosis Date   Achalasia    Anemia    Bacterial infection    Constipation    Eczema    GERD (gastroesophageal reflux disease)    Chronic    Heart murmur    light no follow up   Hiatal hernia    History of chicken pox    Ovarian cyst    PONV (postoperative nausea and vomiting)    Stricture and stenosis of esophagus    Vitiligo    Yeast infection      Past Surgical History:  Procedure Laterality Date   ABDOMINAL HYSTERECTOMY     BREAST LUMPECTOMY     DILATION AND CURETTAGE OF UTERUS     DILITATION & CURRETTAGE/HYSTROSCOPY WITH VERSAPOINT RESECTION  06/08/2012   Procedure: DILATATION & CURETTAGE/HYSTEROSCOPY WITH VERSAPOINT RESECTION;  Surgeon: Esmeralda Arthur, MD;  Location: [facility];  Service: Gynecology;  Laterality: N/A;  and novasure   ESOPHAGEAL MANOMETRY N/A 09/17/2012   Procedure: ESOPHAGEAL MANOMETRY (EM);  Surgeon: Mardella Layman, MD;  Location: [facility];  Service: Endoscopy;  Laterality: N/A;   ESOPHAGOGASTRODUODENOSCOPY N/A 11/05/2012   Procedure:  ESOPHAGOGASTRODUODENOSCOPY (EGD);  Surgeon: Adolph Pollack, MD;  Location: [facility];  Service: General;  Laterality: N/A;   HELLER MYOTOMY N/A 11/05/2012   Procedure: LAPAROSCOPIC ESOPHAGEAL MYOTOMY AND ANTERIOR FUNDOPLICATION;  Surgeon: Adolph Pollack, MD;  Location: [facility];  Service: General;  Laterality: N/A;   ROBOTIC ASSISTED TOTAL HYSTERECTOMY N/A 06/20/2019   Procedure: XI ROBOTIC ASSISTED TOTAL HYSTERECTOMY. BILATERAL SALPINGECTOMY;  Surgeon: Silverio Lay, MD;  [facility];  Service: Gynecology;  Laterality: N/A;   TONSILLECTOMY     TUBAL LIGATION     UTERINE FIBROID SURGERY      Allergies and medications reviewed and updated.   Current Outpatient Medications:    BIOTIN 5000 PO, Take by mouth., Disp: , Rfl:    estradiol (VIVELLE-DOT) 0.075 MG/24HR, Place 1 patch onto the skin once a week., Disp: , Rfl:    fluconazole (DIFLUCAN) 150 MG tablet, Take 1 tablet (150 mg total) by mouth daily. 1 tablet by mouth, if no improvement after 72 hours may take second tablet by mouth, Disp: 2 tablet, Rfl: 0   ibuprofen (ADVIL) 400 MG tablet, Take 400 mg by mouth every 6 (six) hours as needed., Disp: , Rfl:    meloxicam (MOBIC) 15 MG tablet, Take 1 tablet (15 mg total) by mouth daily., Disp: 30 tablet, Rfl: 2   Omega-3 Fatty Acids (FISH OIL OMEGA-3 PO), Take by mouth., Disp: , Rfl:  omeprazole (PRILOSEC) 20 MG capsule, Take 1 capsule (20 mg total) by mouth daily., Disp: 30 capsule, Rfl: 1   meloxicam (MOBIC) 15 MG tablet, Take 1 tablet (15 mg total) by mouth daily. (Patient not taking: Reported on 02/22/2023), Disp: 30 tablet, Rfl: 0  Allergies  Allergen Reactions   Other     Patient states that she cant take any pain meds and if she is put to sleep it makes her sick.    Objective:   Pulse 77   Temp 98.2 F (36.8 C) (Oral)   Ht 5\' 3"  (1.6 m)   Wt 138 lb (62.6 kg)   LMP 05/16/2019 (Exact Date)   SpO2 99%   BMI 24.45 kg/m      02/22/2023    2:39 PM  11/07/2022    8:08 AM 09/05/2022    8:12 AM  Vitals with BMI  Height 5\' 3"  5\' 3"    Weight 138 lbs 130 lbs 3 oz   BMI 24.45 23.07   Systolic  128 120  Diastolic  78 90  Pulse 77 67      Physical Exam Vitals and nursing note reviewed.  Constitutional:      Appearance: Normal appearance. She is normal weight.  HENT:     Head: Normocephalic and atraumatic.  Abdominal:     General: Bowel sounds are normal.     Palpations: Abdomen is soft.  Skin:    General: Skin is warm and dry.  Neurological:     General: No focal deficit present.     Mental Status: She is alert and oriented to person, place, and time. Mental status is at baseline.  Psychiatric:        Mood and Affect: Mood normal.        Behavior: Behavior normal.        Thought Content: Thought content normal.        Judgment: Judgment normal.     Assessment & Plan:  Gastroesophageal reflux disease without esophagitis Assessment & Plan: The pathophysiology of reflux is discussed.  Anti-reflux measures such as raising the head of the bed, avoiding tight clothing or belts, avoiding eating late at night and not lying down shortly after mealtime and achieving weight loss are discussed. Avoid ASA, NSAID's, caffeine, peppermints, alcohol and tobacco. OTC H2 blockers and/or antacids are often very helpful for PRN use. However, for persisting chronic or daily symptoms, prescription strength H2 blockers or a trial of PPI's are often used. FShe should alert me if there are persistent symptoms, dysphagia, weight loss or GI bleeding. Follow up if symptoms worse or do not improve in 1 week.    Other orders -     Omeprazole; Take 1 capsule (20 mg total) by mouth daily.  Dispense: 30 capsule; Refill: 1     Follow up plan: Return if symptoms worsen or fail to improve.  Park Meo, FNP

## 2023-03-20 ENCOUNTER — Ambulatory Visit (INDEPENDENT_AMBULATORY_CARE_PROVIDER_SITE_OTHER): Payer: BC Managed Care – PPO | Admitting: Family Medicine

## 2023-03-20 VITALS — BP 128/76 | HR 85 | Temp 98.3°F | Ht 63.0 in | Wt 133.0 lb

## 2023-03-20 DIAGNOSIS — S61101A Unspecified open wound of right thumb with damage to nail, initial encounter: Secondary | ICD-10-CM

## 2023-03-20 NOTE — Progress Notes (Signed)
Subjective:    Patient ID: Christy Collins, female    DOB: 07-05-1971, 52 y.o.   MRN: [id number] Patient is a very pleasant 52 year old African-American female who presents today with pain at the base of her right thumb.  She states that she accidentally partially avulsed her thumbnail.  It is now loose and she is hanging it on clothing and blankets etc.  This is causing her pain.  She would like me to remove the remaining portion of the nail. Past Medical History:  Diagnosis Date   Achalasia    Anemia    Bacterial infection    Constipation    Eczema    GERD (gastroesophageal reflux disease)    Chronic    Heart murmur    light no follow up   Hiatal hernia    History of chicken pox    Ovarian cyst    PONV (postoperative nausea and vomiting)    Stricture and stenosis of esophagus    Vitiligo    Yeast infection    Past Surgical History:  Procedure Laterality Date   ABDOMINAL HYSTERECTOMY     BREAST LUMPECTOMY     DILATION AND CURETTAGE OF UTERUS     DILITATION & CURRETTAGE/HYSTROSCOPY WITH VERSAPOINT RESECTION  06/08/2012   Procedure: DILATATION & CURETTAGE/HYSTEROSCOPY WITH VERSAPOINT RESECTION;  Surgeon: Esmeralda Arthur, MD;  Location: [facility];  Service: Gynecology;  Laterality: N/A;  and novasure   ESOPHAGEAL MANOMETRY N/A 09/17/2012   Procedure: ESOPHAGEAL MANOMETRY (EM);  Surgeon: Mardella Layman, MD;  [facility];  Service: Endoscopy;  Laterality: N/A;   ESOPHAGOGASTRODUODENOSCOPY N/A 11/05/2012   Procedure: ESOPHAGOGASTRODUODENOSCOPY (EGD);  Surgeon: Adolph Pollack, MD;  Location: [facility];  Service: General;  Laterality: N/A;   HELLER MYOTOMY N/A 11/05/2012   Procedure: LAPAROSCOPIC ESOPHAGEAL MYOTOMY AND ANTERIOR FUNDOPLICATION;  Surgeon: Adolph Pollack, MD;  Location: [facility];  Service: General;  Laterality: N/A;   ROBOTIC ASSISTED TOTAL HYSTERECTOMY N/A 06/20/2019   Procedure: XI ROBOTIC ASSISTED TOTAL HYSTERECTOMY. BILATERAL SALPINGECTOMY;  Surgeon: Silverio Lay, MD;  Location: [facility];  Service: Gynecology;  Laterality: N/A;   TONSILLECTOMY     TUBAL LIGATION     UTERINE FIBROID SURGERY     Current Outpatient Medications on File Prior to Visit  Medication Sig Dispense Refill   BIOTIN 5000 PO Take by mouth.     estradiol (VIVELLE-DOT) 0.075 MG/24HR Place 1 patch onto the skin once a week.     fluconazole (DIFLUCAN) 150 MG tablet Take 1 tablet (150 mg total) by mouth daily. 1 tablet by mouth, if no improvement after 72 hours may take second tablet by mouth 2 tablet 0   ibuprofen (ADVIL) 400 MG tablet Take 400 mg by mouth every 6 (six) hours as needed.     meloxicam (MOBIC) 15 MG tablet Take 1 tablet (15 mg total) by mouth daily. 30 tablet 0   meloxicam (MOBIC) 15 MG tablet Take 1 tablet (15 mg total) by mouth daily. 30 tablet 2   Omega-3 Fatty Acids (FISH OIL OMEGA-3 PO) Take by mouth.     omeprazole (PRILOSEC) 20 MG capsule Take 1 capsule (20 mg total) by mouth daily. 30 capsule 1   [DISCONTINUED] Na Sulfate-K Sulfate-Mg Sulf 17.5-3.13-1.6 GM/177ML SOLN Take 1 kit by mouth as directed. May use generic Suprep, no prior authorization. Take as directed. 354 mL 0   No current facility-administered medications on file prior to visit.   Allergies  Allergen Reactions  Other     Patient states that she cant take any pain meds and if she is put to sleep it makes her sick.   Social History   Socioeconomic History   Marital status: Married    Spouse name: Kearrah Ankney   Number of children: 3   Years of education: 13   Highest education level: Some college, no degree  Occupational History   Occupation: CAP CASE MANAGER    Employer: [employer]  Tobacco Use   Smoking status: Former    Current packs/day: 0.00    Types: Cigarettes    Quit date: 11/05/2012    Years since quitting: 10.3    Passive exposure: Past   Smokeless tobacco: Never  Vaping Use   Vaping status: Never Used  Substance and Sexual Activity    Alcohol use: Yes    Alcohol/week: 3.0 standard drinks of alcohol    Types: 3 Glasses of wine per week   Drug use: Never   Sexual activity: Not Currently    Partners: Male    Birth control/protection: Surgical  Other Topics Concern   Not on file  Social History Narrative   Not on file   Social Determinants of Health   Financial Resource Strain: Low Risk  (03/02/2022)   Overall Financial Resource Strain (CARDIA)    Difficulty of Paying Living Expenses: Not hard at all  Food Insecurity: No Food Insecurity (03/02/2022)   Hunger Vital Sign    Worried About Running Out of Food in the Last Year: Never true    Ran Out of Food in the Last Year: Never true  Transportation Needs: No Transportation Needs (03/02/2022)   PRAPARE - Administrator, Civil Service (Medical): No    Lack of Transportation (Non-Medical): No  Physical Activity: Sufficiently Active (03/02/2022)   Exercise Vital Sign    Days of Exercise per Week: 5 days    Minutes of Exercise per Session: 50 min  Stress: No Stress Concern Present (03/02/2022)   [employer] of Occupational Health - Occupational Stress Questionnaire    Feeling of Stress : Not at all  Social Connections: Socially Integrated (03/02/2022)   Social Connection and Isolation Panel [NHANES]    Frequency of Communication with Friends and Family: More than three times a week    Frequency of Social Gatherings with Friends and Family: More than three times a week    Attends Religious Services: More than 4 times per year    Active Member of [employer] or Organizations: Yes    Attends Engineer, structural: More than 4 times per year    Marital Status: Married  Catering manager Violence: Not At Risk (03/02/2022)   Humiliation, Afraid, Rape, and Kick questionnaire    Fear of Current or Ex-Partner: No    Emotionally Abused: No    Physically Abused: No    Sexually Abused: No   Family History  Problem Relation Age of Onset   Asthma Mother     Hyperlipidemia Mother    Alcohol abuse Father    Hypertension Father    Hypertension Paternal Aunt    Diabetes Paternal Aunt    Cancer Paternal Uncle    Hypertension Paternal Uncle    Diabetes Paternal Uncle    Cancer Maternal Grandmother    Asthma Daughter    Rheumatic fever Daughter    Colon cancer Neg Hx    Colon polyps Neg Hx    Esophageal cancer Neg Hx    Rectal  cancer Neg Hx    Stomach cancer Neg Hx       Review of Systems  All other systems reviewed and are negative.      Objective:   Physical Exam Vitals reviewed.  Constitutional:      General: She is not in acute distress.    Appearance: Normal appearance. She is well-developed and normal weight. She is not ill-appearing, toxic-appearing or diaphoretic.  Cardiovascular:     Rate and Rhythm: Normal rate and regular rhythm.     Heart sounds: Normal heart sounds. No murmur heard.    No friction rub. No gallop.  Pulmonary:     Effort: Pulmonary effort is normal. No respiratory distress.     Breath sounds: Normal breath sounds. No stridor. No wheezing, rhonchi or rales.  Chest:     Chest wall: No tenderness.  Abdominal:     Palpations: Abdomen is soft.  Musculoskeletal:     Right hand: Tenderness and bony tenderness present. No swelling. Decreased range of motion. Normal strength. Normal sensation.  Skin:    General: Skin is warm.     Findings: No rash.  Neurological:     Mental Status: She is alert.   Partially avulsed right thumbnail        Assessment & Plan:  Avulsion of nail of right thumb Patient request that I remove the nail.  I performed a digital block using 0.1% lidocaine without epinephrine.  I then applied a tourniquet to the base of the thumb.  I separated the remaining portion of the nail from the underlying nailbed using a metal elevator.  I then removed the nail with traction using a pair of hemostats.  I applied Neosporin and petroleum gauze to the nailbed.  I then wrapped the nail and  Coban and removed the tourniquet.  Total tourniquet time was less than 2 minutes.  Patient tolerated the procedure well without complication.  Wound care was discussed

## 2023-03-21 ENCOUNTER — Ambulatory Visit: Payer: BC Managed Care – PPO | Admitting: Family Medicine

## 2023-03-22 ENCOUNTER — Encounter: Payer: Self-pay | Admitting: Family Medicine

## 2023-03-23 ENCOUNTER — Inpatient Hospital Stay: Admission: RE | Admit: 2023-03-23 | Payer: BC Managed Care – PPO | Source: Ambulatory Visit

## 2023-03-23 ENCOUNTER — Other Ambulatory Visit: Payer: Self-pay | Admitting: Family Medicine

## 2023-03-23 MED ORDER — CEPHALEXIN 500 MG PO CAPS: 500.0000 mg | ORAL_CAPSULE | Freq: Three times a day (TID) | ORAL | 0 refills | Status: DC

## 2023-03-23 MED ORDER — HYDROCODONE-ACETAMINOPHEN 5-325 MG PO TABS: 1.0000 | ORAL_TABLET | Freq: Four times a day (QID) | ORAL | 0 refills | Status: DC | PRN

## 2023-04-04 DIAGNOSIS — Z1231 Encounter for screening mammogram for malignant neoplasm of breast: Secondary | ICD-10-CM | POA: Diagnosis not present

## 2023-04-04 DIAGNOSIS — Z01419 Encounter for gynecological examination (general) (routine) without abnormal findings: Secondary | ICD-10-CM | POA: Diagnosis not present

## 2023-04-04 DIAGNOSIS — Z139 Encounter for screening, unspecified: Secondary | ICD-10-CM | POA: Diagnosis not present

## 2023-07-18 ENCOUNTER — Ambulatory Visit (INDEPENDENT_AMBULATORY_CARE_PROVIDER_SITE_OTHER): Payer: BC Managed Care – PPO | Admitting: Family Medicine

## 2023-07-18 ENCOUNTER — Encounter: Payer: Self-pay | Admitting: Family Medicine

## 2023-07-18 VITALS — BP 120/76 | HR 95 | Temp 98.3°F | Ht 63.0 in | Wt 133.8 lb

## 2023-07-18 DIAGNOSIS — J329 Chronic sinusitis, unspecified: Secondary | ICD-10-CM

## 2023-07-18 MED ORDER — AMOXICILLIN 875 MG PO TABS: 875.0000 mg | ORAL_TABLET | Freq: Two times a day (BID) | ORAL | 0 refills | Status: AC

## 2023-07-18 MED ORDER — FLUCONAZOLE 150 MG PO TABS: 150.0000 mg | ORAL_TABLET | Freq: Once | ORAL | 0 refills | Status: AC

## 2023-07-18 NOTE — Progress Notes (Signed)
 Subjective:    Patient ID: Christy Collins, female    DOB: [birth year], 53 y.o.   MRN: [id number]  Illness   Patient is a very pleasant 53 year old African-American female who presents today with 2 to 3 weeks of cough.  She states it started with a cough and chest congestion.  However over the last week she developed pain and pressure from maxillary sinuses.  She is able to breathe her nose.  She reports constant postnasal drip.  She reports constant drainage.  She has severe head congestion and a headache.  She denies any chest pain or shortness of breath. Past Medical History:  Diagnosis Date   Achalasia    Anemia    Bacterial infection    Constipation    Eczema    GERD (gastroesophageal reflux disease)    Chronic    Heart murmur    light no follow up   Hiatal hernia    History of chicken pox    Ovarian cyst    PONV (postoperative nausea and vomiting)    Stricture and stenosis of esophagus    Vitiligo    Yeast infection    Past Surgical History:  Procedure Laterality Date   ABDOMINAL HYSTERECTOMY     BREAST LUMPECTOMY     DILATION AND CURETTAGE OF UTERUS     DILITATION & CURRETTAGE/HYSTROSCOPY WITH VERSAPOINT RESECTION  06/08/2012   Procedure: DILATATION & CURETTAGE/HYSTEROSCOPY WITH VERSAPOINT RESECTION;  Surgeon: Nena DELENA App, MD;  Location: [facility];  Service: Gynecology;  Laterality: N/A;  and novasure   ESOPHAGEAL MANOMETRY N/A 09/17/2012   Procedure: ESOPHAGEAL MANOMETRY (EM);  Surgeon: Alm JONELLE Gander, MD;  Location: [facility];  Service: Endoscopy;  Laterality: N/A;   ESOPHAGOGASTRODUODENOSCOPY N/A 11/05/2012   Procedure: ESOPHAGOGASTRODUODENOSCOPY (EGD);  Surgeon: Krystal JINNY Russell, MD;  Location: [facility];  Service: General;  Laterality: N/A;   HELLER MYOTOMY N/A 11/05/2012   Procedure: LAPAROSCOPIC ESOPHAGEAL MYOTOMY AND ANTERIOR FUNDOPLICATION;  Surgeon: Krystal JINNY Russell, MD;  Location: [facility];  Service: General;  Laterality: N/A;   ROBOTIC ASSISTED TOTAL  HYSTERECTOMY N/A 06/20/2019   Procedure: XI ROBOTIC ASSISTED TOTAL HYSTERECTOMY. BILATERAL SALPINGECTOMY;  Surgeon: App Nena, MD;  Location: [facility];  Service: Gynecology;  Laterality: N/A;   TONSILLECTOMY     TUBAL LIGATION     UTERINE FIBROID SURGERY     Current Outpatient Medications on File Prior to Visit  Medication Sig Dispense Refill   BIOTIN 5000 PO Take by mouth.     estradiol (VIVELLE-DOT) 0.075 MG/24HR Place 1 patch onto the skin once a week.     ibuprofen  (ADVIL ) 400 MG tablet Take 400 mg by mouth every 6 (six) hours as needed.     meloxicam  (MOBIC ) 15 MG tablet Take 1 tablet (15 mg total) by mouth daily. 30 tablet 0   omeprazole  (PRILOSEC) 20 MG capsule Take 1 capsule (20 mg total) by mouth daily. 30 capsule 1   [DISCONTINUED] Na Sulfate-K Sulfate-Mg Sulf 17.5-3.13-1.6 GM/177ML SOLN Take 1 kit by mouth as directed. May use generic Suprep, no prior authorization. Take as directed. 354 mL 0   No current facility-administered medications on file prior to visit.   Allergies  Allergen Reactions   Other     Patient states that she cant take any pain meds and if she is put to sleep it makes her sick.   Social History   Socioeconomic History   Marital status: Married    Spouse name: Katharina Jehle  Number of children: 3   Years of education: 13   Highest education level: Some college, no degree  Occupational History   Occupation: CAP CASE MANAGER    Employer: [employer]   Tobacco Use   Smoking status: Former    Current packs/day: 0.00    Types: Cigarettes    Quit date: 11/05/2012    Years since quitting: 10.7    Passive exposure: Past   Smokeless tobacco: Never  Vaping Use   Vaping status: Never Used  Substance and Sexual Activity   Alcohol use: Yes    Alcohol/week: 3.0 standard drinks of alcohol    Types: 3 Glasses of wine per week   Drug use: Never   Sexual activity: Not Currently    Partners: Male    Birth control/protection:  Surgical  Other Topics Concern   Not on file  Social History Narrative   Not on file   Social Drivers of Health   Financial Resource Strain: Low Risk  (03/02/2022)   Overall Financial Resource Strain (CARDIA)    Difficulty of Paying Living Expenses: Not hard at all  Food Insecurity: No Food Insecurity (03/02/2022)   Hunger Vital Sign    Worried About Running Out of Food in the Last Year: Never true    Ran Out of Food in the Last Year: Never true  Transportation Needs: No Transportation Needs (03/02/2022)   PRAPARE - Administrator, Civil Service (Medical): No    Lack of Transportation (Non-Medical): No  Physical Activity: Sufficiently Active (03/02/2022)   Exercise Vital Sign    Days of Exercise per Week: 5 days    Minutes of Exercise per Session: 50 min  Stress: No Stress Concern Present (03/02/2022)   [employer] of Occupational Health - Occupational Stress Questionnaire    Feeling of Stress : Not at all  Social Connections: Socially Integrated (03/02/2022)   Social Connection and Isolation Panel [NHANES]    Frequency of Communication with Friends and Family: More than three times a week    Frequency of Social Gatherings with Friends and Family: More than three times a week    Attends Religious Services: More than 4 times per year    Active Member of [employer] or Organizations: Yes    Attends Engineer, Structural: More than 4 times per year    Marital Status: Married  Catering Manager Violence: Not At Risk (03/02/2022)   Humiliation, Afraid, Rape, and Kick questionnaire    Fear of Current or Ex-Partner: No    Emotionally Abused: No    Physically Abused: No    Sexually Abused: No   Family History  Problem Relation Age of Onset   Asthma Mother    Hyperlipidemia Mother    Alcohol abuse Father    Hypertension Father    Hypertension Paternal Aunt    Diabetes Paternal Aunt    Cancer Paternal Uncle    Hypertension Paternal Uncle    Diabetes Paternal Uncle     Cancer Maternal Grandmother    Asthma Daughter    Rheumatic fever Daughter    Colon cancer Neg Hx    Colon polyps Neg Hx    Esophageal cancer Neg Hx    Rectal cancer Neg Hx    Stomach cancer Neg Hx       Review of Systems  All other systems reviewed and are negative.      Objective:   Physical Exam Vitals reviewed.  Constitutional:      General:  She is not in acute distress.    Appearance: She is well-developed. She is not diaphoretic.  HENT:     Head: Normocephalic and atraumatic.     Right Ear: Tympanic membrane, ear canal and external ear normal.     Left Ear: Tympanic membrane, ear canal and external ear normal.     Nose: Congestion and rhinorrhea present.     Right Turbinates: Enlarged and swollen.     Left Turbinates: Enlarged and swollen.     Right Sinus: Maxillary sinus tenderness and frontal sinus tenderness present.     Left Sinus: Maxillary sinus tenderness and frontal sinus tenderness present.     Mouth/Throat:     Pharynx: No oropharyngeal exudate.  Eyes:     Conjunctiva/sclera: Conjunctivae normal.  Cardiovascular:     Rate and Rhythm: Normal rate and regular rhythm.     Heart sounds: Normal heart sounds. No murmur heard.    No friction rub. No gallop.  Pulmonary:     Effort: Pulmonary effort is normal. No respiratory distress.     Breath sounds: Normal breath sounds. No stridor. No wheezing, rhonchi or rales.  Chest:     Chest wall: No tenderness.  Abdominal:     General: Bowel sounds are normal.     Palpations: Abdomen is soft.     Tenderness: There is no abdominal tenderness.  Musculoskeletal:        General: Normal range of motion.     Cervical back: Normal range of motion and neck supple.  Lymphadenopathy:     Cervical: No cervical adenopathy.  Skin:    General: Skin is warm.           Assessment & Plan:   Rhinosinusitis Patient has a sinus infection.  Begin amoxicillin  875 mg twice daily for 10 days.

## 2023-12-25 DIAGNOSIS — Z79890 Hormone replacement therapy (postmenopausal): Secondary | ICD-10-CM | POA: Diagnosis not present

## 2023-12-25 DIAGNOSIS — N644 Mastodynia: Secondary | ICD-10-CM | POA: Diagnosis not present

## 2023-12-27 ENCOUNTER — Other Ambulatory Visit: Payer: Self-pay | Admitting: Obstetrics and Gynecology

## 2023-12-27 DIAGNOSIS — N644 Mastodynia: Secondary | ICD-10-CM

## 2024-02-20 NOTE — Progress Notes (Signed)
 Ellouise Console, PA-C [address] [place], [place]  [address] Phone: [phone]   Primary Care Physician: Duanne Butler DASEN, MD  Primary Gastroenterologist:  Ellouise Console, PA-C / Norleen Kiang, MD   Chief Complaint: Dysphagia, GERD, Achalasia   HPI:   Christy Collins is a 53 y.o. female is referred to evaluate dysphagia.  Current Symptoms: She patient states she has had recurrent dysphagia symptoms for 1 year.  She has difficulty swallowing solid foods and liquids.  She is also having some vomiting episodes after eating.  Soy sauce is a major trigger.  She tried PPIs and H2 RB's in the past which made her symptoms worse.  Currently she takes Alka-Seltzer and OTC antacids as needed.  Apple cider vinegar and baking soda helps.  Admits to acid reflux and regurgitation of acid at night.  Mild heartburn.  Solid food is getting stuck in her esophagus and not going down well.  She occasionally has to vomit food back up.  Also has sensation that liquids are not going down well.  She took Carafate  in the past which helped.  She denies weight loss.  No other GI symptoms.  No recent barium swallow test.  She has history of achalasia status post laparoscopic Heller myotomy and Dor fundoplication in 2014 by Dr. Lily.  Also has history of GERD.  Currently on omeprazole  20 Mg daily.  04/2022 last colonoscopy by Dr. Kiang: 3 mm tubular adenoma polyp removed from ascending colon.  Excellent prep.  7 year repeat (04/2029).  Last EGD in 2006 by Dr. Jakie showed GERD and possible occult stricture versus motility disorder.  Esophagus dilated.  Current Outpatient Medications  Medication Sig Dispense Refill   BIOTIN 5000 PO Take by mouth.     estradiol (VIVELLE-DOT) 0.075 MG/24HR Place 1 patch onto the skin once a week.     ibuprofen  (ADVIL ) 400 MG tablet Take 400 mg by mouth every 6 (six) hours as needed.     meloxicam  (MOBIC ) 15 MG tablet Take 1 tablet (15 mg total) by mouth daily. 30  tablet 0   sucralfate  (CARAFATE ) 1 g tablet Take 1 tablet (1 g total) by mouth 3 (three) times daily before meals. 90 tablet 5   omeprazole  (PRILOSEC) 20 MG capsule Take 1 capsule (20 mg total) by mouth daily. (Patient not taking: Reported on 02/21/2024) 30 capsule 1   No current facility-administered medications for this visit.    Allergies as of 02/21/2024 - Review Complete 02/21/2024  Allergen Reaction Noted   Other  10/19/2012    Past Medical History:  Diagnosis Date   Achalasia    Anemia    Bacterial infection    Constipation    Eczema    GERD (gastroesophageal reflux disease)    Chronic    Heart murmur    light no follow up   Hiatal hernia    History of chicken pox    Ovarian cyst    PONV (postoperative nausea and vomiting)    Stricture and stenosis of esophagus    Vitiligo    Yeast infection     Past Surgical History:  Procedure Laterality Date   ABDOMINAL HYSTERECTOMY     BREAST LUMPECTOMY     DILATION AND CURETTAGE OF UTERUS     DILITATION & CURRETTAGE/HYSTROSCOPY WITH VERSAPOINT RESECTION  06/08/2012   Procedure: DILATATION & CURETTAGE/HYSTEROSCOPY WITH VERSAPOINT RESECTION;  Surgeon: Nena DELENA App, MD;  Location: [facility];  Service: Gynecology;  Laterality: N/A;  and novasure   ESOPHAGEAL MANOMETRY N/A 09/17/2012   Procedure: ESOPHAGEAL MANOMETRY (EM);  Surgeon: Alm JONELLE Gander, MD;  Location: [facility];  Service: Endoscopy;  Laterality: N/A;   ESOPHAGOGASTRODUODENOSCOPY N/A 11/05/2012   Procedure: ESOPHAGOGASTRODUODENOSCOPY (EGD);  Surgeon: Krystal JINNY Russell, MD;  Location: [facility];  Service: General;  Laterality: N/A;   HELLER MYOTOMY N/A 11/05/2012   Procedure: LAPAROSCOPIC ESOPHAGEAL MYOTOMY AND ANTERIOR FUNDOPLICATION;  Surgeon: Krystal JINNY Russell, MD;  Location: [facility];  Service: General;  Laterality: N/A;   ROBOTIC ASSISTED TOTAL HYSTERECTOMY N/A 06/20/2019   Procedure: XI ROBOTIC ASSISTED TOTAL HYSTERECTOMY. BILATERAL SALPINGECTOMY;  Surgeon: Darcel Pool, MD;  Location: [facility];  Service: Gynecology;  Laterality: N/A;   TONSILLECTOMY     TUBAL LIGATION     UTERINE FIBROID SURGERY      Review of Systems:    All systems reviewed and negative except where noted in HPI.    Physical Exam:  BP 126/80   Pulse 74   Ht 5' 3 (1.6 m)   Wt 134 lb 3 oz (60.9 kg)   LMP 05/16/2019 (Exact Date)   BMI 23.77 kg/m  Patient's last menstrual period was 05/16/2019 (exact date).  General: Well-nourished, well-developed in no acute distress.  Lungs: Clear to auscultation bilaterally. Non-labored. Heart: Regular rate and rhythm, no murmurs rubs or gallops.  Abdomen: Bowel sounds are normal; Abdomen is Soft; No hepatosplenomegaly, masses or hernias; mild epigastric abdominal Tenderness; rest of abdomen is not tender.  No guarding or rebound tenderness. Neuro: Alert and oriented x 3.  Grossly intact.  Psych: Alert and cooperative, normal mood and affect.   Imaging Studies: No results found.  Labs: CBC    Component Value Date/Time   WBC 5.3 08/08/2022 1254   RBC 4.27 08/08/2022 1254   HGB 12.3 08/08/2022 1254   HCT 36.8 08/08/2022 1254   PLT 254 08/08/2022 1254   MCV 86.2 08/08/2022 1254   MCH 28.8 08/08/2022 1254   MCHC 33.4 08/08/2022 1254   RDW 11.8 08/08/2022 1254   LYMPHSABS 2,134 11/26/2021 0900   MONOABS 0.5 12/16/2020 0829   EOSABS 180 11/26/2021 0900   BASOSABS 29 11/26/2021 0900    CMP     Component Value Date/Time   NA 138 08/08/2022 1254   K 3.7 08/08/2022 1254   CL 102 08/08/2022 1254   CO2 28 08/08/2022 1254   GLUCOSE 99 08/08/2022 1254   BUN 14 08/08/2022 1254   CREATININE 1.02 08/08/2022 1254   CALCIUM 8.9 08/08/2022 1254   PROT 6.7 08/08/2022 1254   ALBUMIN 4.6 12/16/2020 0829   AST 13 08/08/2022 1254   ALT 13 08/08/2022 1254   ALKPHOS 26 (L) 12/16/2020 0829   BILITOT 0.3 08/08/2022 1254   GFRNONAA 70 12/23/2020 1609   GFRAA 82 12/23/2020 1609       Assessment and Plan:    Christy Collins is a 53 y.o. y/o female presents for:  Dysphagia 2.   GERD 3.   Achalasia s/p laparoscopic Heller myotomy and Dor fundoplication in 2014   Plan: - Barium Swallow with Tablet - Scheduling EGD I discussed risks of EGD with patient to include risk of bleeding, perforation, and risk of sedation.  Patient expressed understanding and agrees to proceed with EGD.  - Rx Carafate  1g TID, #90, 5 RF - Pt. States PPIs and H2RBs don't work and make her symptoms worse. - Recommend Lifestyle Modifications to prevent Acid Reflux.  Rec. Avoid coffee, sodas, peppermint, garlic,  onions, alcohol, citrus fruits, chocolate, tomatoes, fatty and spicey foods.  Avoid eating 2-3 hours before bedtime.    Ellouise Console, PA-C  Follow up after EGD with Dr. Abran or TG

## 2024-02-21 ENCOUNTER — Ambulatory Visit: Admitting: Physician Assistant

## 2024-02-21 ENCOUNTER — Encounter: Payer: Self-pay | Admitting: Physician Assistant

## 2024-02-21 VITALS — BP 126/80 | HR 74 | Ht 63.0 in | Wt 134.2 lb

## 2024-02-21 DIAGNOSIS — R1310 Dysphagia, unspecified: Secondary | ICD-10-CM

## 2024-02-21 DIAGNOSIS — K220 Achalasia of cardia: Secondary | ICD-10-CM

## 2024-02-21 DIAGNOSIS — K219 Gastro-esophageal reflux disease without esophagitis: Secondary | ICD-10-CM

## 2024-02-21 MED ORDER — SUCRALFATE 1 G PO TABS: 1.0000 g | ORAL_TABLET | Freq: Three times a day (TID) | ORAL | 5 refills | Status: DC

## 2024-02-21 NOTE — Progress Notes (Signed)
 Noted

## 2024-02-21 NOTE — Patient Instructions (Addendum)
 We have sent the following medications to your pharmacy for you to pick up at your convenience: Carafate  1 g three times daily with meals  You have been scheduled for a Barium Esophogram at [facility] (1st floor of the hospital) on 02/22/24 at 9:30am. Please arrive 30 minutes prior to your appointment for registration. Make certain not to have anything to eat or drink 3 hours prior to your test. If you need to reschedule for any reason, please contact radiology at [phone] to do so. __________________________________________________________________ A barium swallow is an examination that concentrates on views of the esophagus. This tends to be a double contrast exam (barium and two liquids which, when combined, create a gas to distend the wall of the oesophagus) or single contrast (non-ionic iodine based). The study is usually tailored to your symptoms so a good history is essential. Attention is paid during the study to the form, structure and configuration of the esophagus, looking for functional disorders (such as aspiration, dysphagia, achalasia, motility and reflux) EXAMINATION You may be asked to change into a gown, depending on the type of swallow being performed. A radiologist and radiographer will perform the procedure. The radiologist will advise you of the type of contrast selected for your procedure and direct you during the exam. You will be asked to stand, sit or lie in several different positions and to hold a small amount of fluid in your mouth before being asked to swallow while the imaging is performed .In some instances you may be asked to swallow barium coated marshmallows to assess the motility of a solid food bolus. The exam can be recorded as a digital or video fluoroscopy procedure. POST PROCEDURE It will take 1-2 days for the barium to pass through your system. To facilitate this, it is important, unless otherwise directed, to increase your fluids for the next  24-48hrs and to resume your normal diet.  This test typically takes about 30 minutes to perform. __________________________________________________________________________________ Christy Collins have been scheduled for an endoscopy. Please follow written instructions given to you at your visit today.  If you use inhalers (even only as needed), please bring them with you on the day of your procedure.  If you take any of the following medications, they will need to be adjusted prior to your procedure:   DO NOT TAKE 7 DAYS PRIOR TO TEST- Trulicity (dulaglutide) Ozempic, Wegovy (semaglutide) Mounjaro (tirzepatide) Bydureon Bcise (exanatide extended release)  DO NOT TAKE 1 DAY PRIOR TO YOUR TEST Rybelsus (semaglutide) Adlyxin (lixisenatide) Victoza (liraglutide) Byetta (exanatide) ___________________________________________________________________________ Thank you for trusting me with your gastrointestinal care!   Ellouise Console, PA-C  Please follow up sooner if symptoms increase or worsen  Due to recent changes in healthcare laws, you may see the results of your imaging and laboratory studies on MyChart before your provider has had a chance to review them.  We understand that in some cases there may be results that are confusing or concerning to you. Not all laboratory results come back in the same time frame and the provider may be waiting for multiple results in order to interpret others.  Please give us  48 hours in order for your provider to thoroughly review all the results before contacting the office for clarification of your results.  _______________________________________________________  If your blood pressure at your visit was 140/90 or greater, please contact your primary care physician to follow up on this.  _______________________________________________________  If you are age 52 or older, your body mass index should  be between 23-30. Your Body mass index is 23.77 kg/m. If this is  out of the aforementioned range listed, please consider follow up with your Primary Care Provider.  If you are age 3 or younger, your body mass index should be between 19-25. Your Body mass index is 23.77 kg/m. If this is out of the aformentioned range listed, please consider follow up with your Primary Care Provider.   ________________________________________________________  The [facility] providers would like to encourage you to use MYCHART to communicate with providers for non-urgent requests or questions.  Due to long hold times on the telephone, sending your provider a message by [facility] may be a faster and more efficient way to get a response.  Please allow 48 business hours for a response.  Please remember that this is for non-urgent requests.  _______________________________________________________

## 2024-02-22 ENCOUNTER — Ambulatory Visit (HOSPITAL_COMMUNITY)
Admission: RE | Admit: 2024-02-22 | Discharge: 2024-02-22 | Disposition: A | Discharge status: Home / Self Care | Source: Ambulatory Visit | Attending: Physician Assistant | Admitting: Physician Assistant

## 2024-02-22 DIAGNOSIS — K220 Achalasia of cardia: Secondary | ICD-10-CM | POA: Insufficient documentation

## 2024-02-22 DIAGNOSIS — K2289 Other specified disease of esophagus: Secondary | ICD-10-CM | POA: Diagnosis not present

## 2024-02-22 DIAGNOSIS — R1310 Dysphagia, unspecified: Secondary | ICD-10-CM | POA: Insufficient documentation

## 2024-02-22 DIAGNOSIS — K219 Gastro-esophageal reflux disease without esophagitis: Secondary | ICD-10-CM | POA: Diagnosis not present

## 2024-02-23 ENCOUNTER — Ambulatory Visit: Payer: Self-pay | Admitting: Physician Assistant

## 2024-02-23 NOTE — Progress Notes (Signed)
 I sent patient results through MyChart. Patient is scheduled for EGD with Dr. Abran 03/19/24. Forward Results to Dr. Abran to review before procedure. Ellouise Console, PA-C

## 2024-03-15 ENCOUNTER — Encounter: Payer: Self-pay | Admitting: Internal Medicine

## 2024-03-19 ENCOUNTER — Ambulatory Visit (AMBULATORY_SURGERY_CENTER): Admitting: Internal Medicine

## 2024-03-19 ENCOUNTER — Encounter: Payer: Self-pay | Admitting: Internal Medicine

## 2024-03-19 VITALS — BP 143/83 | HR 61 | Temp 97.9°F | Resp 14 | Ht 63.0 in | Wt 134.0 lb

## 2024-03-19 DIAGNOSIS — K2289 Other specified disease of esophagus: Secondary | ICD-10-CM

## 2024-03-19 DIAGNOSIS — B3781 Candidal esophagitis: Secondary | ICD-10-CM | POA: Diagnosis not present

## 2024-03-19 DIAGNOSIS — R1310 Dysphagia, unspecified: Secondary | ICD-10-CM

## 2024-03-19 DIAGNOSIS — K219 Gastro-esophageal reflux disease without esophagitis: Secondary | ICD-10-CM

## 2024-03-19 DIAGNOSIS — K222 Esophageal obstruction: Secondary | ICD-10-CM

## 2024-03-19 DIAGNOSIS — T182XXA Foreign body in stomach, initial encounter: Secondary | ICD-10-CM

## 2024-03-19 DIAGNOSIS — K220 Achalasia of cardia: Secondary | ICD-10-CM

## 2024-03-19 MED ORDER — FLUCONAZOLE 100 MG PO TABS: 100.0000 mg | ORAL_TABLET | Freq: Every day | ORAL | 0 refills | Status: DC

## 2024-03-19 MED ORDER — SODIUM CHLORIDE 0.9 % IV SOLN: 500.0000 mL | Freq: Once | INTRAVENOUS | Status: DC

## 2024-03-19 MED ORDER — PANTOPRAZOLE SODIUM 40 MG PO TBEC: 40.0000 mg | DELAYED_RELEASE_TABLET | Freq: Every day | ORAL | 11 refills | Status: DC

## 2024-03-19 MED ORDER — CLOTRIMAZOLE 10 MG MT TROC: 10.0000 mg | Freq: Every day | OROMUCOSAL | 1 refills | Status: AC

## 2024-03-19 NOTE — Progress Notes (Signed)
 Expand All Collapse All       Ellouise Console, PA-C [address] [place], [place]  [address] Phone: [phone]     Primary Care Physician: Duanne Butler DASEN, MD   Primary Gastroenterologist:  Ellouise Console, PA-C / Norleen Kiang, MD    Chief Complaint: Dysphagia, GERD, Achalasia    HPI:   Christy Collins is a 53 y.o. female is referred to evaluate dysphagia.   Current Symptoms: She patient states she has had recurrent dysphagia symptoms for 1 year.  She has difficulty swallowing solid foods and liquids.  She is also having some vomiting episodes after eating.  Soy sauce is a major trigger.  She tried PPIs and H2 RB's in the past which made her symptoms worse.  Currently she takes Alka-Seltzer and OTC antacids as needed.  Apple cider vinegar and baking soda helps.  Admits to acid reflux and regurgitation of acid at night.  Mild heartburn.  Solid food is getting stuck in her esophagus and not going down well.  She occasionally has to vomit food back up.  Also has sensation that liquids are not going down well.  She took Carafate  in the past which helped.  She denies weight loss.  No other GI symptoms.   No recent barium swallow test.   She has history of achalasia status post laparoscopic Heller myotomy and Dor fundoplication in 2014 by Dr. Lily.  Also has history of GERD.  Currently on omeprazole  20 Mg daily.   04/2022 last colonoscopy by Dr. Kiang: 3 mm tubular adenoma polyp removed from ascending colon.  Excellent prep.  7 year repeat (04/2029).   Last EGD in 2006 by Dr. Jakie showed GERD and possible occult stricture versus motility disorder.  Esophagus dilated.         Current Outpatient Medications  Medication Sig Dispense Refill   BIOTIN 5000 PO Take by mouth.       estradiol (VIVELLE-DOT) 0.075 MG/24HR Place 1 patch onto the skin once a week.       ibuprofen  (ADVIL ) 400 MG tablet Take 400 mg by mouth every 6 (six) hours as needed.       meloxicam  (MOBIC ) 15 MG tablet  Take 1 tablet (15 mg total) by mouth daily. 30 tablet 0   sucralfate  (CARAFATE ) 1 g tablet Take 1 tablet (1 g total) by mouth 3 (three) times daily before meals. 90 tablet 5   omeprazole  (PRILOSEC) 20 MG capsule Take 1 capsule (20 mg total) by mouth daily. (Patient not taking: Reported on 02/21/2024) 30 capsule 1      No current facility-administered medications for this visit.             Allergies as of 02/21/2024 - Review Complete 02/21/2024  Allergen Reaction Noted   Other   10/19/2012          Past Medical History:  Diagnosis Date   Achalasia     Anemia     Bacterial infection     Constipation     Eczema     GERD (gastroesophageal reflux disease)      Chronic    Heart murmur      light no follow up   Hiatal hernia     History of chicken pox     Ovarian cyst     PONV (postoperative nausea and vomiting)     Stricture and stenosis of esophagus     Vitiligo     Yeast infection  Past Surgical History:  Procedure Laterality Date   ABDOMINAL HYSTERECTOMY       BREAST LUMPECTOMY       DILATION AND CURETTAGE OF UTERUS       DILITATION & CURRETTAGE/HYSTROSCOPY WITH VERSAPOINT RESECTION   06/08/2012    Procedure: DILATATION & CURETTAGE/HYSTEROSCOPY WITH VERSAPOINT RESECTION;  Surgeon: Nena DELENA App, MD;  Location: [facility];  Service: Gynecology;  Laterality: N/A;  and novasure   ESOPHAGEAL MANOMETRY N/A 09/17/2012    Procedure: ESOPHAGEAL MANOMETRY (EM);  Surgeon: Alm JONELLE Gander, MD;  Location: [facility];  Service: Endoscopy;  Laterality: N/A;   ESOPHAGOGASTRODUODENOSCOPY N/A 11/05/2012    Procedure: ESOPHAGOGASTRODUODENOSCOPY (EGD);  Surgeon: Krystal JINNY Russell, MD;  Location: [facility];  Service: General;  Laterality: N/A;   HELLER MYOTOMY N/A 11/05/2012    Procedure: LAPAROSCOPIC ESOPHAGEAL MYOTOMY AND ANTERIOR FUNDOPLICATION;  Surgeon: Krystal JINNY Russell, MD;  Location: [facility];  Service: General;  Laterality: N/A;   ROBOTIC ASSISTED TOTAL HYSTERECTOMY N/A  06/20/2019    Procedure: XI ROBOTIC ASSISTED TOTAL HYSTERECTOMY. BILATERAL SALPINGECTOMY;  Surgeon: App Nena, MD;  Location: [facility];  Service: Gynecology;  Laterality: N/A;   TONSILLECTOMY       TUBAL LIGATION       UTERINE FIBROID SURGERY              Review of Systems:    All systems reviewed and negative except where noted in HPI.      Physical Exam:  BP 126/80   Pulse 74   Ht 5' 3 (1.6 m)   Wt 134 lb 3 oz (60.9 kg)   LMP 05/16/2019 (Exact Date)   BMI 23.77 kg/m  Patient's last menstrual period was 05/16/2019 (exact date).   General: Well-nourished, well-developed in no acute distress.  Lungs: Clear to auscultation bilaterally. Non-labored. Heart: Regular rate and rhythm, no murmurs rubs or gallops.  Abdomen: Bowel sounds are normal; Abdomen is Soft; No hepatosplenomegaly, masses or hernias; mild epigastric abdominal Tenderness; rest of abdomen is not tender.  No guarding or rebound tenderness. Neuro: Alert and oriented x 3.  Grossly intact.  Psych: Alert and cooperative, normal mood and affect.     Imaging Studies: Imaging Results  No results found.     Labs: CBC Labs (Brief)          Component Value Date/Time    WBC 5.3 08/08/2022 1254    RBC 4.27 08/08/2022 1254    HGB 12.3 08/08/2022 1254    HCT 36.8 08/08/2022 1254    PLT 254 08/08/2022 1254    MCV 86.2 08/08/2022 1254    MCH 28.8 08/08/2022 1254    MCHC 33.4 08/08/2022 1254    RDW 11.8 08/08/2022 1254    LYMPHSABS 2,134 11/26/2021 0900    MONOABS 0.5 12/16/2020 0829    EOSABS 180 11/26/2021 0900    BASOSABS 29 11/26/2021 0900        CMP     Labs (Brief)          Component Value Date/Time    NA 138 08/08/2022 1254    K 3.7 08/08/2022 1254    CL 102 08/08/2022 1254    CO2 28 08/08/2022 1254    GLUCOSE 99 08/08/2022 1254    BUN 14 08/08/2022 1254    CREATININE 1.02 08/08/2022 1254    CALCIUM 8.9 08/08/2022 1254    PROT 6.7 08/08/2022 1254    ALBUMIN 4.6  12/16/2020 0829    AST 13 08/08/2022 1254  ALT 13 08/08/2022 1254    ALKPHOS 26 (L) 12/16/2020 0829    BILITOT 0.3 08/08/2022 1254    GFRNONAA 70 12/23/2020 1609    GFRAA 82 12/23/2020 1609              Assessment and Plan:    Christy Collins is a 53 y.o. y/o female presents for:   Dysphagia 2.   GERD 3.   Achalasia s/p laparoscopic Heller myotomy and Dor fundoplication in 2014    Plan: - Barium Swallow with Tablet - Scheduling EGD I discussed risks of EGD with patient to include risk of bleeding, perforation, and risk of sedation.  Patient expressed understanding and agrees to proceed with EGD.  - Rx Carafate  1g TID, #90, 5 RF - Pt. States PPIs and H2RBs don't work and make her symptoms worse. - Recommend Lifestyle Modifications to prevent Acid Reflux.  Rec. Avoid coffee, sodas, peppermint, garlic, onions, alcohol, citrus fruits, chocolate, tomatoes, fatty and spicey foods.  Avoid eating 2-3 hours before bedtime.     Ellouise Console, PA-C   Follow up after EGD with Dr. Abran or TG    Barium swallow shows distal esophageal stricture which prevents passage of 13 mm tablet.  Now for EGD with dilation

## 2024-03-19 NOTE — Progress Notes (Signed)
 A/o x 3, VSS, gd SR's, pleased with anesthesia, report to RN

## 2024-03-19 NOTE — Progress Notes (Signed)
 Updated medical record.

## 2024-03-19 NOTE — Op Note (Signed)
 [facility] Patient Name: Christy Collins Procedure Date: 03/19/2024 2:43 PM MRN: [id number] Endoscopist: Norleen SAILOR. Abran , MD, [id number] Age: 53 Referring MD:  Date of Birth: 1971/04/28 Gender: Female Account #: [id number] Procedure:                Upper GI endoscopy with balloon dilation of                            esophagus, 20 mm max Indications:              Dysphagia, Esophageal reflux. History of achalasia                            status post remote Heller myotomy Medicines:                Monitored Anesthesia Care Procedure:                Pre-Anesthesia Assessment:                           - Prior to the procedure, a History and Physical                            was performed, and patient medications and                            allergies were reviewed. The patient's tolerance of                            previous anesthesia was also reviewed. The risks                            and benefits of the procedure and the sedation                            options and risks were discussed with the patient.                            All questions were answered, and informed consent                            was obtained. Prior Anticoagulants: The patient has                            taken no anticoagulant or antiplatelet agents. ASA                            Grade Assessment: II - A patient with mild systemic                            disease. After reviewing the risks and benefits,                            the patient was deemed in satisfactory condition to  undergo the procedure.                           After obtaining informed consent, the endoscope was                            passed under direct vision. Throughout the                            procedure, the patient's blood pressure, pulse, and                            oxygen saturations were monitored continuously. The                            GIF [id number] #[id number] was  introduced through the                            mouth, and advanced to the second part of duodenum.                            The upper GI endoscopy was accomplished without                            difficulty. The patient tolerated the procedure                            well. Scope In: Scope Out: Findings:                 The esophagus revealed multiple whitish plaques                            throughout consistent with candidiasis. Esophagus                            was somewhat dilated. There was angulation                            approximately 4 cm above the GE junction. There was                            stricturing at the gastroesophageal junction (39                            cm) with associated inflammation. The food stuff                            above this stricture was advanced into the stomach.                            A TTS dilator was passed through the scope.                            Dilation with an 18-19-20 mm balloon  dilator was                            performed to 20 mm.                           The stomach was normal.                           The examined duodenum was normal.                           The cardia and gastric fundus were normal on                            retroflexion. Complications:            No immediate complications. Estimated Blood Loss:     Estimated blood loss: none. Impression:               1. Dilated esophagus with candidiasis, distal                            angulation and stricturing as described. Status                            post dilation                           2. History of achalasia status post Heller myotomy                           3. Otherwise unremarkable EGD. Recommendation:           - Patient has a contact number available for                            emergencies. The signs and symptoms of potential                            delayed complications were discussed with the                             patient. Return to normal activities tomorrow.                            Written discharge instructions were provided to the                            patient.                           - Post dilation diet.                           - Continue present medications.                           - PRESCRIBE DIFLUCAN   100 mg; #8; take 2 by mouth on                            day 1 then 1 daily until finished (a total of 1                            week). This will treat the infection in the                            esophagus                           - PRESCRIBE PANTOPRAZOLE  40 mg daily; #30; 11                            refills. Please take 1 daily, EVERY DAY. This will                            address issues with heartburn and protect your                            esophagus                           - Office follow-up with Dr. Abran in 6 to 8 weeks Norleen SAILOR. Abran, MD 03/19/2024 3:35:45 PM This report has been signed electronically.

## 2024-03-19 NOTE — Patient Instructions (Addendum)
 -Continue current medications.start taking Pantoprazole  40 mg daily. And Clotrimazole  ( Mycelex ) take 5 times a day, swish and swallow for 10 days.   Handout on  GERD and post dilation diet provided    YOU HAD AN ENDOSCOPIC PROCEDURE TODAY AT THE [facility]:   Refer to the procedure report that was given to you for any specific questions about what was found during the examination.  If the procedure report does not answer your questions, please call your gastroenterologist to clarify.  If you requested that your care partner not be given the details of your procedure findings, then the procedure report has been included in a sealed envelope for you to review at your convenience later.  YOU SHOULD EXPECT: Some feelings of bloating in the abdomen. Passage of more gas than usual.  Walking can help get rid of the air that was put into your GI tract during the procedure and reduce the bloating. If you had a lower endoscopy (such as a colonoscopy or flexible sigmoidoscopy) you may notice spotting of blood in your stool or on the toilet paper. If you underwent a bowel prep for your procedure, you may not have a normal bowel movement for a few days.  Please Note:  You might notice some irritation and congestion in your nose or some drainage.  This is from the oxygen used during your procedure.  There is no need for concern and it should clear up in a day or so.  SYMPTOMS TO REPORT IMMEDIATELY:  Following upper endoscopy (EGD)  Vomiting of blood or coffee ground material  New chest pain or pain under the shoulder blades  Painful or persistently difficult swallowing  New shortness of breath  Fever of 100F or higher  Black, tarry-looking stools  For urgent or emergent issues, a gastroenterologist can be reached at any hour by calling [phone]. Do not use MyChart messaging for urgent concerns.    DIET:  Clear liquid diet for one hour, until 4:30 pm. Soft diet starting at 4:30 pm  until tomorrow. See post dilation diet handout for recommendations.  .  Drink plenty of fluids but you should avoid alcoholic beverages for 24 hours  ACTIVITY:  You should plan to take it easy for the rest of today and you should NOT DRIVE or use heavy machinery until tomorrow (because of the sedation medicines used during the test).    FOLLOW UP: Our staff will call the number listed on your records the next business day following your procedure.  We will call around 7:15- 8:00 am to check on you and address any questions or concerns that you may have regarding the information given to you following your procedure. If we do not reach you, we will leave a message.     If any biopsies were taken you will be contacted by phone or by letter within the next 1-3 weeks.  Please call us  at [phone] if you have not heard about the biopsies in 3 weeks.    SIGNATURES/CONFIDENTIALITY: You and/or your care partner have signed paperwork which will be entered into your electronic medical record.  These signatures attest to the fact that that the information above on your After Visit Summary has been reviewed and is understood.  Full responsibility of the confidentiality of this discharge information lies with you and/or your care-partner.  Pantoprazole  Tablets What is this medication? PANTOPRAZOLE  (pan TOE pra zole) treats heartburn, stomach ulcers, reflux disease, or other conditions that cause too  much stomach acid. It works by reducing the amount of acid in the stomach. It belongs to a group of medications called PPIs. This medicine may be used for other purposes; ask your health care provider or pharmacist if you have questions. COMMON BRAND NAME(S): Protonix  What should I tell my care team before I take this medication? They need to know if you have any of these conditions: Liver disease Low levels of calcium, magnesium, or potassium in the blood Lupus An unusual or allergic reaction to  pantoprazole , other medication, foods, dyes, or preservatives Pregnant or trying to get pregnant Breast-feeding How should I use this medication? Take this medication by mouth. Swallow the tablets whole with a drink of water. Follow the directions on the prescription label. Do not crush, break, or chew. Take your medication at regular intervals. Do not take your medication more often than directed. A special MedGuide will be given to you by the pharmacist with each prescription and refill. Be sure to read this information carefully each time. Talk to your care team about the use of this medication in children. While this medication may be prescribed for children as young as 5 years for selected conditions, precautions do apply. Overdosage: If you think you have taken too much of this medicine contact a poison control center or emergency room at once. NOTE: This medicine is only for you. Do not share this medicine with others. What if I miss a dose? If you miss a dose, take it as soon as you can. If it is almost time for your next dose, take only that dose. Do not take double or extra doses. What may interact with this medication? Do not take this medication with any of the following: Atazanavir Nelfinavir This medication may also interact with the following: Ampicillin Delavirdine Erlotinib Iron salts Medications for fungal infections like ketoconazole, itraconazole and voriconazole Methotrexate Mycophenolate mofetil Warfarin This list may not describe all possible interactions. Give your health care provider a list of all the medicines, herbs, non-prescription drugs, or dietary supplements you use. Also tell them if you smoke, drink alcohol, or use illegal drugs. Some items may interact with your medicine. What should I watch for while using this medication? It can take several days before your stomach pain gets better. Check with your care team if your condition does not start to get  better, or if it gets worse. Do not treat diarrhea with over the counter products. Contact your care team if you have diarrhea that lasts more than 2 days or if it is severe and watery. You may need blood work done while you are taking this medication. Using this medication for a long time may weaken your bones. The risk of bone fractures may be increased. Talk to your care team about your bone health. Using this medication for a long time may cause growths (polyps) in the stomach. They usually don't cause any symptoms. They are usually not cancerous. Contact your care team if you notice pain or tenderness when you press your stomach, have nausea, or see bloody or black, tar-like stools. This medication may cause a decrease in vitamin B12. You should make sure that you get enough vitamin B12 while you are taking this medication. Discuss the foods you eat and the vitamins you take with your care team. What side effects may I notice from receiving this medication? Side effects that you should report to your care team as soon as possible: Allergic reactions--skin rash, itching, hives, swelling  of the face, lips, tongue, or throat Kidney injury--decrease in the amount of urine, swelling of the ankles, hands, or feet Low magnesium level--muscle pain or cramps, unusual weakness or fatigue, fast or irregular heartbeat, tremors Low vitamin B12 level--pain, tingling, or numbness in the hands or feet, muscle weakness, dizziness, confusion, difficulty concentrating Rash on the cheeks or ams that gets worse in the sun Redness, blistering, peeling, or loosening of the skin, including inside the mouth Severe diarrhea, fever Unusual bruising or bleeding Side effects that usually do not require medical attention (report to your care team if they continue or are bothersome): Diarrhea Headache Vomiting This list may not describe all possible side effects. Call your doctor for medical advice about side effects. You  may report side effects to FDA at 1-800-FDA-1088. Where should I keep my medication? Keep out of the reach of children and pets. Store at room temperature between 15 and 30 degrees C (59 and 86 degrees F). Protect from light and moisture. Throw away any unused medication after the expiration date. NOTE: This sheet is a summary. It may not cover all possible information. If you have questions about this medicine, talk to your doctor, pharmacist, or health care provider.  2024 Elsevier/Gold Standard (2020-09-22 00:00:00)

## 2024-03-19 NOTE — Progress Notes (Signed)
 Called to room to assist during endoscopic procedure.  Patient ID and intended procedure confirmed with present staff. Received instructions for my participation in the procedure from the performing physician.

## 2024-03-20 ENCOUNTER — Telehealth: Payer: Self-pay | Admitting: *Deleted

## 2024-03-20 NOTE — Telephone Encounter (Signed)
  Follow up Call-     03/19/2024    2:41 PM 05/09/2022    3:11 PM  Call back number  Post procedure Call Back phone  # [phone]  Permission to leave phone message Yes Yes     Patient questions:  Do you have a fever, pain , or abdominal swelling? No. Pain Score  0 *  Have you tolerated food without any problems? Yes.    Have you been able to return to your normal activities? Yes.    Do you have any questions about your discharge instructions: Diet   No. Medications  No. Follow up visit  No.  Do you have questions or concerns about your Care? No.  Actions: * If pain score is 4 or above: No action needed, pain <4.

## 2024-04-15 ENCOUNTER — Ambulatory Visit (INDEPENDENT_AMBULATORY_CARE_PROVIDER_SITE_OTHER): Admitting: Podiatry

## 2024-04-15 DIAGNOSIS — L600 Ingrowing nail: Secondary | ICD-10-CM

## 2024-04-15 NOTE — Progress Notes (Signed)
 Chief Complaint  Patient presents with   Nail Problem    Bilateral Great toe nail medial pain. L is worse x 6 months. PT had R medial great toe nail removed before. Not diabetic. No anti coag    Subjective: Patient presents today for evaluation of pain to the medial border of the left great toe. Patient is concerned for possible ingrown nail.  It is very sensitive to touch.    History of partial nail matricectomy medial border of the right great toe 04/21/2018.  Dr. Gretel.  She continues to have some mild tenderness to this area as well concerning for recurrent ingrown toenail.  Patient presents today for further treatment and evaluation.  Past Medical History:  Diagnosis Date   Achalasia    Anemia    Bacterial infection    Constipation    Eczema    GERD (gastroesophageal reflux disease)    Chronic    Heart murmur    light no follow up   Hiatal hernia    History of chicken pox    Ovarian cyst    PONV (postoperative nausea and vomiting)    Stricture and stenosis of esophagus    Vitiligo    Yeast infection     Past Surgical History:  Procedure Laterality Date   ABDOMINAL HYSTERECTOMY     BREAST LUMPECTOMY     DILATION AND CURETTAGE OF UTERUS     DILITATION & CURRETTAGE/HYSTROSCOPY WITH VERSAPOINT RESECTION  06/08/2012   Procedure: DILATATION & CURETTAGE/HYSTEROSCOPY WITH VERSAPOINT RESECTION;  Surgeon: Nena DELENA App, MD;  Location: [facility];  Service: Gynecology;  Laterality: N/A;  and novasure   ESOPHAGEAL MANOMETRY N/A 09/17/2012   Procedure: ESOPHAGEAL MANOMETRY (EM);  Surgeon: Alm JONELLE Gander, MD;  Location: [facility];  Service: Endoscopy;  Laterality: N/A;   ESOPHAGOGASTRODUODENOSCOPY N/A 11/05/2012   Procedure: ESOPHAGOGASTRODUODENOSCOPY (EGD);  Surgeon: Krystal JINNY Russell, MD;  Location: [facility];  Service: General;  Laterality: N/A;   HELLER MYOTOMY N/A 11/05/2012   Procedure: LAPAROSCOPIC ESOPHAGEAL MYOTOMY AND ANTERIOR FUNDOPLICATION;  Surgeon: Krystal JINNY Russell, MD;  Location: [facility];  Service: General;  Laterality: N/A;   ROBOTIC ASSISTED TOTAL HYSTERECTOMY N/A 06/20/2019   Procedure: XI ROBOTIC ASSISTED TOTAL HYSTERECTOMY. BILATERAL SALPINGECTOMY;  Surgeon: App Nena, MD;  Location: [facility];  Service: Gynecology;  Laterality: N/A;   TONSILLECTOMY     TUBAL LIGATION     UPPER GASTROINTESTINAL ENDOSCOPY     UTERINE FIBROID SURGERY      Allergies  Allergen Reactions   Other Nausea And Vomiting    Patient states that she cant take any pain meds and if she is put to sleep it makes her sick.    Objective:  General: Well developed, nourished, in no acute distress, alert and oriented x3   Dermatology: Skin is warm, dry and supple bilateral.  Medial border left great toe is tender with evidence of an ingrowing nail. Pain on palpation noted to the border of the nail fold.  Tenderness also with palpation to the medial border of the right great toe  Vascular: DP and PT pulses palpable.  No clinical evidence of vascular compromise  Neruologic: Grossly intact via light touch bilateral.  Musculoskeletal: No pedal deformity noted  Assesement: #1 Paronychia with ingrowing nail medial border left great toe #2 ingrowing toenail medial border right great toe #3 h/o partial chemical nail matricectomy medial border right great toe.  04/21/2018.  Dr. Gretel  Plan of Care:  -Patient evaluated.  -  Patient has a very busy upcoming weekend with the [employer] homecoming this weekend in [place].  She is concerned that if she has the procedure today she will not be able to enjoy the weekend.  She would rather have the procedure performed after the weekend -Return to clinic 1 week for correction of the ingrown toenail to the left great toe and possibly the right great toe  Thresa EMERSON Sar, DPM [facility]  Dr. Thresa EMERSON Sar, DPM    [address] [place], [place] [address]                 Office [phone]  Fax [phone]

## 2024-04-29 ENCOUNTER — Ambulatory Visit (INDEPENDENT_AMBULATORY_CARE_PROVIDER_SITE_OTHER): Admitting: Podiatry

## 2024-04-29 ENCOUNTER — Encounter: Payer: Self-pay | Admitting: Podiatry

## 2024-04-29 VITALS — Ht 63.0 in | Wt 134.0 lb

## 2024-04-29 DIAGNOSIS — L600 Ingrowing nail: Secondary | ICD-10-CM

## 2024-04-29 NOTE — Patient Instructions (Signed)

## 2024-04-29 NOTE — Progress Notes (Signed)
 Chief Complaint  Patient presents with   Ingrown Toenail    Pt is here due to bilateral ingrown to great toenails states the left if the worse wants to have them removed.    Subjective: Patient presents today for evaluation of pain to the medial border bilateral great toes. Patient is concerned for possible ingrown nail.  It is very sensitive to touch.  Patient presents today for further treatment and evaluation.  Past Medical History:  Diagnosis Date   Achalasia    Anemia    Bacterial infection    Constipation    Eczema    GERD (gastroesophageal reflux disease)    Chronic    Heart murmur    light no follow up   Hiatal hernia    History of chicken pox    Ovarian cyst    PONV (postoperative nausea and vomiting)    Stricture and stenosis of esophagus    Vitiligo    Yeast infection     Past Surgical History:  Procedure Laterality Date   ABDOMINAL HYSTERECTOMY     BREAST LUMPECTOMY     DILATION AND CURETTAGE OF UTERUS     DILITATION & CURRETTAGE/HYSTROSCOPY WITH VERSAPOINT RESECTION  06/08/2012   Procedure: DILATATION & CURETTAGE/HYSTEROSCOPY WITH VERSAPOINT RESECTION;  Surgeon: Nena DELENA App, MD;  Location: [facility];  Service: Gynecology;  Laterality: N/A;  and novasure   ESOPHAGEAL MANOMETRY N/A 09/17/2012   Procedure: ESOPHAGEAL MANOMETRY (EM);  Surgeon: Alm JONELLE Gander, MD;  Location: [facility];  Service: Endoscopy;  Laterality: N/A;   ESOPHAGOGASTRODUODENOSCOPY N/A 11/05/2012   Procedure: ESOPHAGOGASTRODUODENOSCOPY (EGD);  Surgeon: Krystal JINNY Russell, MD;  Location: [facility];  Service: General;  Laterality: N/A;   HELLER MYOTOMY N/A 11/05/2012   Procedure: LAPAROSCOPIC ESOPHAGEAL MYOTOMY AND ANTERIOR FUNDOPLICATION;  Surgeon: Krystal JINNY Russell, MD;  Location: [facility];  Service: General;  Laterality: N/A;   ROBOTIC ASSISTED TOTAL HYSTERECTOMY N/A 06/20/2019   Procedure: XI ROBOTIC ASSISTED TOTAL HYSTERECTOMY. BILATERAL SALPINGECTOMY;  Surgeon: App Nena, MD;   Location: [facility];  Service: Gynecology;  Laterality: N/A;   TONSILLECTOMY     TUBAL LIGATION     UPPER GASTROINTESTINAL ENDOSCOPY     UTERINE FIBROID SURGERY      Allergies  Allergen Reactions   Other Nausea And Vomiting    Patient states that she cant take any pain meds and if she is put to sleep it makes her sick.    Objective:  General: Well developed, nourished, in no acute distress, alert and oriented x3   Dermatology: Skin is warm, dry and supple bilateral.  Bilateral great toes is tender with evidence of an ingrowing nail. Pain on palpation noted to the border of the nail fold. The remaining nails appear unremarkable at this time.   Vascular: DP and PT pulses palpable.  No clinical evidence of vascular compromise  Neruologic: Grossly intact via light touch bilateral.  Musculoskeletal: No pedal deformity noted  Assesement: #1 Paronychia with ingrowing nail medial border bilateral great toes #2 h/o partial chemical nail matricectomy medial border right great toe.  04/21/2018.  Dr. Gretel  Plan of Care:  -Patient evaluated.  -Discussed treatment alternatives and plan of care. Explained nail avulsion procedure and post procedure course to patient. -Patient opted for permanent partial nail avulsion of the ingrown portion of the nail.  -Prior to procedure, local anesthesia infiltration utilized using 3 ml of a 50:50 mixture of 2% plain lidocaine  and 0.5% plain marcaine  in a normal hallux  block fashion and a betadine prep performed.  -Partial permanent nail avulsion with chemical matrixectomy performed using 3x30sec applications of phenol followed by alcohol flush.  -Light dressing applied.  Post care instructions provided -Return to clinic 3 weeks  Thresa EMERSON Sar, DPM [facility]  Dr. Thresa EMERSON Sar, DPM    [address] [place], [place] [address]                Office [phone]  Fax [phone]

## 2024-04-30 ENCOUNTER — Ambulatory Visit: Admitting: Internal Medicine

## 2024-04-30 ENCOUNTER — Encounter: Payer: Self-pay | Admitting: Internal Medicine

## 2024-04-30 VITALS — BP 112/68 | HR 89 | Ht 63.0 in | Wt 134.0 lb

## 2024-04-30 DIAGNOSIS — Z860101 Personal history of adenomatous and serrated colon polyps: Secondary | ICD-10-CM

## 2024-04-30 DIAGNOSIS — K222 Esophageal obstruction: Secondary | ICD-10-CM | POA: Diagnosis not present

## 2024-04-30 DIAGNOSIS — K219 Gastro-esophageal reflux disease without esophagitis: Secondary | ICD-10-CM | POA: Diagnosis not present

## 2024-04-30 DIAGNOSIS — K220 Achalasia of cardia: Secondary | ICD-10-CM | POA: Diagnosis not present

## 2024-04-30 DIAGNOSIS — R1310 Dysphagia, unspecified: Secondary | ICD-10-CM | POA: Diagnosis not present

## 2024-04-30 MED ORDER — PANTOPRAZOLE SODIUM 40 MG PO TBEC
40.0000 mg | DELAYED_RELEASE_TABLET | Freq: Every day | ORAL | 3 refills | Status: AC
Start: 2024-04-30 — End: ?

## 2024-04-30 NOTE — Patient Instructions (Addendum)
 We have sent the following medications to your pharmacy for you to pick up at your convenience:  Protonix .  Please follow up in one year.  _______________________________________________________  If your blood pressure at your visit was 140/90 or greater, please contact your primary care physician to follow up on this.  _______________________________________________________  If you are age 52 or older, your body mass index should be between 23-30. Your Body mass index is 23.74 kg/m. If this is out of the aforementioned range listed, please consider follow up with your Primary Care Provider.  If you are age 41 or younger, your body mass index should be between 19-25. Your Body mass index is 23.74 kg/m. If this is out of the aformentioned range listed, please consider follow up with your Primary Care Provider.   ________________________________________________________  The [facility] providers would like to encourage you to use MYCHART to communicate with providers for non-urgent requests or questions.  Due to long hold times on the telephone, sending your provider a message by [facility] may be a faster and more efficient way to get a response.  Please allow 48 business hours for a response.  Please remember that this is for non-urgent requests.  _______________________________________________________  [facility] is using a team-based approach to care.  Your team is made up of your doctor and two to three APPS. Our APPS (Nurse Practitioners and Physician Assistants) work with your physician to ensure care continuity for you. They are fully qualified to address your health concerns and develop a treatment plan. They communicate directly with your gastroenterologist to care for you. Seeing the Advanced Practice Practitioners on your physician's team can help you by facilitating care more promptly, often allowing for earlier appointments, access to diagnostic testing, procedures, and  other specialty referrals.

## 2024-04-30 NOTE — Progress Notes (Signed)
 HISTORY OF PRESENT ILLNESS:  Christy Collins is a 53 y.o. female with past medical history as listed below.  She is followed in this office for the following issues:  1.  Achalasia status post laparoscopic Heller myotomy and Dor fundoplication 2014 2.  GERD with esophageal stricturing 3.  Adenomatous colon polyp on colon cancer screening October 2023  The patient was seen in this office February 21, 2024 regarding dysphagia.  See that dictation.  She subsequently underwent barium esophagram which revealed distal esophageal stricture, proximal esophageal dilation, and postop changes from prior surgery.  She subsequently underwent upper endoscopy March 19, 2024.  She was found to have a somewhat dilated esophagus with candidiasis.  As well as stricturing at the gastroesophageal junction associated with reflux esophagitis.  She was dilated to a maximum of 20 mm via balloon.  She was prescribed Diflucan  and pantoprazole .  Patient reported an allergy or sensitivity to Diflucan  on day 4.  She was switched to Mycelex  troches but did not complete therapy.  She tells me that she has been taking her PPI sporadically.  Fortunately, she reports feeling great.  No burning, dysphagia, or dyne aphasia.  Eating well. 3.  REVIEW OF SYSTEMS:  All non-GI ROS negative.   Past Medical History:  Diagnosis Date   Achalasia    Anemia    Bacterial infection    Constipation    Eczema    GERD (gastroesophageal reflux disease)    Chronic    Heart murmur    light no follow up   Hiatal hernia    History of chicken pox    Ovarian cyst    PONV (postoperative nausea and vomiting)    Stricture and stenosis of esophagus    Vitiligo    Yeast infection     Past Surgical History:  Procedure Laterality Date   ABDOMINAL HYSTERECTOMY     BREAST LUMPECTOMY     DILATION AND CURETTAGE OF UTERUS     DILITATION & CURRETTAGE/HYSTROSCOPY WITH VERSAPOINT RESECTION  06/08/2012   Procedure: DILATATION &  CURETTAGE/HYSTEROSCOPY WITH VERSAPOINT RESECTION;  Surgeon: Nena DELENA App, MD;  Location: [facility];  Service: Gynecology;  Laterality: N/A;  and novasure   ESOPHAGEAL MANOMETRY N/A 09/17/2012   Procedure: ESOPHAGEAL MANOMETRY (EM);  Surgeon: Alm JONELLE Gander, MD;  Location: [facility];  Service: Endoscopy;  Laterality: N/A;   ESOPHAGOGASTRODUODENOSCOPY N/A 11/05/2012   Procedure: ESOPHAGOGASTRODUODENOSCOPY (EGD);  Surgeon: Krystal JINNY Russell, MD;  Location: [facility];  Service: General;  Laterality: N/A;   HELLER MYOTOMY N/A 11/05/2012   Procedure: LAPAROSCOPIC ESOPHAGEAL MYOTOMY AND ANTERIOR FUNDOPLICATION;  Surgeon: Krystal JINNY Russell, MD;  Location: [facility];  Service: General;  Laterality: N/A;   ROBOTIC ASSISTED TOTAL HYSTERECTOMY N/A 06/20/2019   Procedure: XI ROBOTIC ASSISTED TOTAL HYSTERECTOMY. BILATERAL SALPINGECTOMY;  Surgeon: App Nena, MD;  Location: [facility];  Service: Gynecology;  Laterality: N/A;   TONSILLECTOMY     TUBAL LIGATION     UPPER GASTROINTESTINAL ENDOSCOPY     UTERINE FIBROID SURGERY      Social History Christy Collins  reports that she quit smoking about 11 years ago. Her smoking use included cigarettes. She has been exposed to tobacco smoke. She has never used smokeless tobacco. She reports current alcohol use of about 3.0 standard drinks of alcohol per week. She reports that she does not use drugs.  family history includes Alcohol abuse in her father; Asthma in her daughter and mother; Cancer in her maternal grandmother and  paternal uncle; Diabetes in her paternal aunt and paternal uncle; Hyperlipidemia in her mother; Hypertension in her father, paternal aunt, and paternal uncle; Rheumatic fever in her daughter.  Allergies  Allergen Reactions   Other Nausea And Vomiting    Patient states that she cant take any pain meds and if she is put to sleep it makes her sick.       PHYSICAL EXAMINATION: Vital signs: BP 112/68   Pulse 89   Ht 5' 3  (1.6 m)   Wt 134 lb (60.8 kg)   LMP 05/16/2019 (Exact Date)   BMI 23.74 kg/m   Constitutional: generally well-appearing, no acute distress Psychiatric: alert and oriented x3, cooperative Eyes: Anicteric Abdomen: Not reexamined Extremities: no abnormalities on visible extremities Skin: no lesions on visible extremities Neuro: No gross deficits  ASSESSMENT:  1.  Achalasia status post laparoscopic Heller myotomy and Dor fundoplication 2014 2.  GERD with esophageal stricturing and esophagitis. 3.  Recent problems with dysphagia.  Barium esophagram and endoscopy as above.  Esophageal candidiasis. 3.  Adenomatous colon polyp on colon cancer screening October 2023   PLAN:  1.  Reflux precautions 2.  Encouraged to take PPI daily to treat esophagitis and reduce premature recurrent stricturing 3.  Pantoprazole  refilled for 1 year.  Medication risk reviewed 4.  Surveillance colonoscopy around October 2030 5.  Routine GI office follow-up 1 year.  Contact the office in the interim for any questions or problems A total time of 30 minutes was spent preparing to see the patient,, obtaining interval history, performing medically appropriate physical exam, counseling and educating the patient regarding the above listed issues, ordering medication, defining follow-up intervals, and documenting clinical information in the health record

## 2024-05-06 ENCOUNTER — Other Ambulatory Visit: Payer: Self-pay | Admitting: Obstetrics and Gynecology

## 2024-05-06 DIAGNOSIS — Z1330 Encounter for screening examination for mental health and behavioral disorders, unspecified: Secondary | ICD-10-CM | POA: Diagnosis not present

## 2024-05-06 DIAGNOSIS — Z01419 Encounter for gynecological examination (general) (routine) without abnormal findings: Secondary | ICD-10-CM | POA: Diagnosis not present

## 2024-05-06 DIAGNOSIS — Z1231 Encounter for screening mammogram for malignant neoplasm of breast: Secondary | ICD-10-CM

## 2024-05-20 ENCOUNTER — Ambulatory Visit (INDEPENDENT_AMBULATORY_CARE_PROVIDER_SITE_OTHER): Admitting: Podiatry

## 2024-05-20 ENCOUNTER — Encounter: Payer: Self-pay | Admitting: Podiatry

## 2024-05-20 VITALS — Ht 63.0 in | Wt 134.0 lb

## 2024-05-20 DIAGNOSIS — L600 Ingrowing nail: Secondary | ICD-10-CM | POA: Diagnosis not present

## 2024-05-21 NOTE — Progress Notes (Signed)
   Chief Complaint  Patient presents with   Ingrown Toenail    Pt is here to f/u on bilateral great toenails after having them removed, she states the left foot is ok but still having pain in the right toe.    Subjective: 53 y.o. female presents today status post permanent nail avulsion procedure of the bilateral great toenails that was performed on 04/29/2024.  Doing well..   Past Medical History:  Diagnosis Date   Achalasia    Anemia    Bacterial infection    Constipation    Eczema    GERD (gastroesophageal reflux disease)    Chronic    Heart murmur    light no follow up   Hiatal hernia    History of chicken pox    Ovarian cyst    PONV (postoperative nausea and vomiting)    Stricture and stenosis of esophagus    Vitiligo    Yeast infection     Objective: Neurovascular status intact.  Skin is warm, dry and supple. Nail and respective nail fold appears to be healing appropriately.   Assessment: #1 s/p partial permanent nail matrixectomy medial border bilateral great toes #2 h/o partial chemical nail matricectomy medial border right great toe. 04/21/2018. Dr. Gretel    Plan of care: #1 patient was evaluated  #2 light debridement of the periungual debris was performed to the border of the respective toe and nail plate using a tissue nipper. #3 patient is to return to clinic on a PRN basis.   Thresa EMERSON Sar, DPM [facility]  Dr. Thresa EMERSON Sar, DPM    [address] [place], [place] [address]                Office [phone]  Fax [phone]

## 2024-06-05 DIAGNOSIS — N838 Other noninflammatory disorders of ovary, fallopian tube and broad ligament: Secondary | ICD-10-CM | POA: Diagnosis not present

## 2024-06-05 DIAGNOSIS — N958 Other specified menopausal and perimenopausal disorders: Secondary | ICD-10-CM | POA: Diagnosis not present

## 2024-07-05 ENCOUNTER — Encounter (HOSPITAL_COMMUNITY): Payer: Self-pay | Admitting: Emergency Medicine

## 2024-07-05 ENCOUNTER — Emergency Department (HOSPITAL_COMMUNITY)

## 2024-07-05 ENCOUNTER — Emergency Department (HOSPITAL_COMMUNITY)
Admission: EM | Admit: 2024-07-05 | Discharge: 2024-07-05 | Disposition: A | Discharge status: Home / Self Care | Attending: Emergency Medicine | Admitting: Emergency Medicine

## 2024-07-05 DIAGNOSIS — K2900 Acute gastritis without bleeding: Secondary | ICD-10-CM | POA: Insufficient documentation

## 2024-07-05 DIAGNOSIS — R109 Unspecified abdominal pain: Secondary | ICD-10-CM | POA: Diagnosis not present

## 2024-07-05 DIAGNOSIS — R079 Chest pain, unspecified: Secondary | ICD-10-CM | POA: Diagnosis not present

## 2024-07-05 LAB — BASIC METABOLIC PANEL WITH GFR
Anion gap: 11 (ref 5–15)
BUN: 10 mg/dL (ref 6–20)
CO2: 28 mmol/L (ref 22–32)
Calcium: 8.7 mg/dL — ABNORMAL LOW (ref 8.9–10.3)
Chloride: 101 mmol/L (ref 98–111)
Creatinine, Ser: 0.98 mg/dL (ref 0.44–1.00)
GFR, Estimated: >60 mL/min
Glucose, Bld: 122 mg/dL — ABNORMAL HIGH (ref 70–99)
Potassium: 3.9 mmol/L (ref 3.5–5.1)
Sodium: 140 mmol/L (ref 135–145)

## 2024-07-05 LAB — URINALYSIS, ROUTINE W REFLEX MICROSCOPIC
Bilirubin Urine: NEGATIVE
Glucose, UA: NEGATIVE mg/dL
Hgb urine dipstick: NEGATIVE
Ketones, ur: NEGATIVE mg/dL
Leukocytes,Ua: NEGATIVE
Nitrite: NEGATIVE
Protein, ur: NEGATIVE mg/dL
Specific Gravity, Urine: 1.009 (ref 1.005–1.030)
pH: 7 (ref 5.0–8.0)

## 2024-07-05 LAB — CBC
HCT: 43.6 % (ref 36.0–46.0)
Hemoglobin: 14.5 g/dL (ref 12.0–15.0)
MCH: 29 pg (ref 26.0–34.0)
MCHC: 33.3 g/dL (ref 30.0–36.0)
MCV: 87.2 fL (ref 80.0–100.0)
Platelets: 287 K/uL (ref 150–400)
RBC: 5 MIL/uL (ref 3.87–5.11)
RDW: 11.9 % (ref 11.5–15.5)
WBC: 7.9 K/uL (ref 4.0–10.5)
nRBC: 0 % (ref 0.0–0.2)

## 2024-07-05 LAB — TROPONIN T, HIGH SENSITIVITY: Troponin T High Sensitivity: <15 ng/L (ref 0–19)

## 2024-07-05 LAB — LIPASE, BLOOD: Lipase: 38 U/L (ref 11–51)

## 2024-07-05 LAB — RESP PANEL BY RT-PCR (RSV, FLU A&B, COVID)  RVPGX2
Influenza A by PCR: NEGATIVE
Influenza B by PCR: NEGATIVE
Resp Syncytial Virus by PCR: NEGATIVE
SARS Coronavirus 2 by RT PCR: NEGATIVE

## 2024-07-05 MED ORDER — PANTOPRAZOLE SODIUM 40 MG IV SOLR
40.0000 mg | Freq: Once | INTRAVENOUS | Status: AC
  Administered 2024-07-05: 40 mg via INTRAVENOUS
  Filled 2024-07-05: qty 10

## 2024-07-05 MED ORDER — ACETAMINOPHEN 500 MG PO TABS
1000.0000 mg | ORAL_TABLET | Freq: Once | ORAL | Status: AC
  Administered 2024-07-05: 1000 mg via ORAL
  Filled 2024-07-05: qty 2

## 2024-07-05 MED ORDER — MAALOX MAX 400-400-40 MG/5ML PO SUSP: 30.0000 mL | Freq: Four times a day (QID) | ORAL | 0 refills | Status: AC | PRN

## 2024-07-05 MED ORDER — LACTATED RINGERS IV BOLUS
1000.0000 mL | Freq: Once | INTRAVENOUS | Status: AC
  Administered 2024-07-05: 1000 mL via INTRAVENOUS

## 2024-07-05 MED ORDER — FAMOTIDINE 20 MG PO TABS
20.0000 mg | ORAL_TABLET | Freq: Once | ORAL | Status: AC
  Administered 2024-07-05: 20 mg via ORAL
  Filled 2024-07-05: qty 1

## 2024-07-05 MED ORDER — KETOROLAC TROMETHAMINE 15 MG/ML IJ SOLN
15.0000 mg | Freq: Once | INTRAMUSCULAR | Status: AC
  Administered 2024-07-05: 15 mg via INTRAVENOUS
  Filled 2024-07-05: qty 1

## 2024-07-05 MED ORDER — IOHEXOL 350 MG/ML SOLN
75.0000 mL | Freq: Once | INTRAVENOUS | Status: AC | PRN
  Administered 2024-07-05: 75 mL via INTRAVENOUS

## 2024-07-05 MED ORDER — PANTOPRAZOLE SODIUM 40 MG PO TBEC: 40.0000 mg | DELAYED_RELEASE_TABLET | Freq: Two times a day (BID) | ORAL | 0 refills | Status: AC

## 2024-07-05 MED ORDER — MORPHINE SULFATE (PF) 4 MG/ML IV SOLN: 4.0000 mg | Freq: Once | INTRAVENOUS | Status: DC

## 2024-07-05 MED ORDER — ONDANSETRON HCL 4 MG/2ML IJ SOLN
4.0000 mg | Freq: Once | INTRAMUSCULAR | Status: AC
  Administered 2024-07-05: 4 mg via INTRAVENOUS
  Filled 2024-07-05: qty 2

## 2024-07-05 NOTE — ED Notes (Signed)
 Patient transported to X-ray

## 2024-07-05 NOTE — ED Triage Notes (Signed)
 Patient reports mid chest and central abdominal pain with lightheadedness onset this evening , no SOB .

## 2024-07-05 NOTE — ED Provider Notes (Signed)
 " [facility] Provider Note   CSN: [id number] Arrival date & time: 07/05/24  0256     History Chief Complaint  Patient presents with   Abdominal Pain   Chest Pain    HPI Christy Collins is a 53 y.o. female presenting for chief complaint of abdominal pain  in her epigastrium and nausea vomitting . States her symptoms started yesterday morning and has been progressive in nature. History of GERD/esophageal obstruction/PUD.   Patient's recorded medical, surgical, social, medication list and allergies were reviewed in the Snapshot window as part of the initial history.   Review of Systems   Review of Systems  Constitutional:  Negative for chills and fever.  HENT:  Negative for ear pain and sore throat.   Eyes:  Negative for pain and visual disturbance.  Respiratory:  Negative for cough and shortness of breath.   Cardiovascular:  Negative for chest pain and palpitations.  Gastrointestinal:  Positive for abdominal pain, nausea and vomiting.  Genitourinary:  Negative for dysuria and hematuria.  Musculoskeletal:  Negative for arthralgias and back pain.  Skin:  Negative for color change and rash.  Neurological:  Negative for seizures and syncope.  All other systems reviewed and are negative.   Physical Exam Updated Vital Signs BP (!) 148/83   Pulse 62   Temp 97.9 F (36.6 C) (Oral)   Resp 16   LMP 05/16/2019   SpO2 100%  Physical Exam Vitals and nursing note reviewed.  Constitutional:      General: She is not in acute distress.    Appearance: She is well-developed.  HENT:     Head: Normocephalic and atraumatic.  Eyes:     Conjunctiva/sclera: Conjunctivae normal.  Cardiovascular:     Rate and Rhythm: Normal rate and regular rhythm.     Heart sounds: No murmur heard. Pulmonary:     Effort: Pulmonary effort is normal. No respiratory distress.     Breath sounds: Normal breath sounds.  Abdominal:     Palpations: Abdomen is  soft.     Tenderness: There is abdominal tenderness. There is no guarding.  Musculoskeletal:        General: No swelling.     Cervical back: Neck supple.  Skin:    General: Skin is warm and dry.     Capillary Refill: Capillary refill takes less than 2 seconds.  Neurological:     Mental Status: She is alert.  Psychiatric:        Mood and Affect: Mood normal.      ED Course/ Medical Decision Making/ A&P    Procedures Procedures   Medications Ordered in ED Medications  ondansetron  (ZOFRAN ) injection 4 mg (4 mg Intravenous Given 07/05/24 0420)  pantoprazole  (PROTONIX ) injection 40 mg (40 mg Intravenous Given 07/05/24 0456)  famotidine  (PEPCID ) tablet 20 mg (20 mg Oral Given 07/05/24 0420)  ketorolac  (TORADOL ) 15 MG/ML injection 15 mg (15 mg Intravenous Given 07/05/24 0420)  acetaminophen  (TYLENOL ) tablet 1,000 mg (1,000 mg Oral Given 07/05/24 0455)  lactated ringers  bolus 1,000 mL (0 mLs Intravenous Stopped 07/05/24 0607)  iohexol  (OMNIPAQUE ) 350 MG/ML injection 75 mL (75 mLs Intravenous Contrast Given 07/05/24 0549)   Medical Decision Making:   Christy Collins is a 53 y.o. female who presented to the ED today with abdominal pain, detailed above.    Patient placed on continuous vitals and telemetry monitoring while in ED which was reviewed periodically.  Complete initial physical exam performed, notably  the patient  was tender in her epigastrium.     Reviewed and confirmed nursing documentation for past medical history, family history, social history.    Initial Assessment:   With the patient's presentation of abdominal pain, most likely diagnosis is nonspecific etiology/peptic ulcer disease given history of similar. Other diagnoses were considered including (but not limited to) gastroenteritis, colitis, small bowel obstruction, appendicitis, cholecystitis, pancreatitis, nephrolithiasis, UTI, pyleonephritis. These are considered less likely due to history of present illness and  physical exam findings.   This is most consistent with an acute life/limb threatening illness complicated by underlying chronic conditions.   Initial Plan:  CBC/CMP to evaluate for underlying infectious/metabolic etiology for patient's abdominal pain  Lipase to evaluate for pancreatitis  EKG to evaluate for cardiac source of pain  CTAB/Pelvis with contrast to evaluate for structural/surgical etiology of patients' severe abdominal pain.  Urinalysis and repeat physical assessment to evaluate for UTI/Pyelonpehritis  Empiric management of symptoms with escalating pain control and antiemetics as needed.   Initial Study Results:   Laboratory  All laboratory results reviewed without evidence of clinically relevant pathology.      EKG EKG was reviewed independently. Rate, rhythm, axis, intervals all examined and without medically relevant abnormality. ST segments without concerns for elevations.    Radiology All images reviewed independently. Agree with radiology report at this time.   CT ABDOMEN PELVIS W CONTRAST Result Date: 07/05/2024 CLINICAL DATA:  Abdominal pain. EXAM: CT ABDOMEN AND PELVIS WITH CONTRAST TECHNIQUE: Multidetector CT imaging of the abdomen and pelvis was performed using the standard protocol following bolus administration of intravenous contrast. RADIATION DOSE REDUCTION: This exam was performed according to the departmental dose-optimization program which includes automated exposure control, adjustment of the mA and/or kV according to patient size and/or use of iterative reconstruction technique. CONTRAST:  75mL OMNIPAQUE  IOHEXOL  350 MG/ML SOLN COMPARISON:  01/24/2018 FINDINGS: Lower chest: Patulous distal esophagus in this patient with a history of Heller myotomy and fundoplication. Hepatobiliary: No suspicious focal abnormality within the liver parenchyma. Small area of low attenuation in the anterior liver, adjacent to the falciform ligament, is in a characteristic location  for focal fatty deposition/anomalous perfusion. There is no evidence for gallstones, gallbladder wall thickening, or pericholecystic fluid. No intrahepatic or extrahepatic biliary dilation. Pancreas: No focal mass lesion. No dilatation of the main duct. No intraparenchymal cyst. No peripancreatic edema. Spleen: No splenomegaly. No suspicious focal mass lesion. Adrenals/Urinary Tract: No adrenal nodule or mass. Kidneys unremarkable. No evidence for hydroureter. The urinary bladder appears normal for the degree of distention. Stomach/Bowel: Surgical changes are noted in the region of the esophagogastric junction. Stomach is decompressed. Marked edema and wall thickening is identified in the antral region of the stomach (axial 25/9). No discrete ulcer is evident in this region. Duodenum is normally positioned as is the ligament of Treitz. No small bowel wall thickening. No small bowel dilatation. The terminal ileum is normal. The appendix is normal. No gross colonic mass. No colonic wall thickening. Vascular/Lymphatic: No abdominal aortic aneurysm. No abdominal aortic atherosclerotic calcification. Portal vein and superior mesenteric vein are patent. There is no gastrohepatic or hepatoduodenal ligament lymphadenopathy. No retroperitoneal or mesenteric lymphadenopathy. No pelvic sidewall lymphadenopathy. Reproductive: There is no adnexal mass.  Hysterectomy. Other: No intraperitoneal free fluid. Musculoskeletal: No worrisome lytic or sclerotic osseous abnormality. IMPRESSION: 1. Marked edema and wall thickening in the antral region of the stomach. No discrete ulcer is evident in this region. Imaging features are compatible with antral gastritis or peptic  ulcer disease. Upper endoscopy recommended to further evaluate. 2. Patulous distal esophagus in this patient with a history of Heller myotomy and fundoplication. Electronically Signed   By: Camellia Candle M.D.   On: 07/05/2024 06:56   DG Chest 2 View Result Date:  07/05/2024 EXAM: 2 VIEW(S) XRAY OF THE CHEST 07/05/2024 03:45:58 AM COMPARISON: None available. CLINICAL HISTORY: chest pain FINDINGS: LUNGS AND PLEURA: No focal pulmonary opacity. No pleural effusion. No pneumothorax. HEART AND MEDIASTINUM: No acute abnormality of the cardiac and mediastinal silhouettes. BONES AND SOFT TISSUES: No acute osseous abnormality. IMPRESSION: 1. No acute cardiopulmonary abnormality. Electronically signed by: Norman Gatlin MD 07/05/2024 03:58 AM EST RP Workstation: [id number]    Final Reassessment and Plan:   53 year old female presenting with a chief complaint of abdominal pain.  Detailed above. Lab work shows no emergent pathology.  Symptoms improved with conservative management and CT scan consistent with diffuse gastritis.  This is consistent with her clinical picture.  No focal perforation or ulceration identified on cross-sectional imaging. Discussed with patient, could be admitted for gastroenterologic evaluation immediately, however she is well-established with gastroenterology in the outpatient setting.  Will increase her PPI to twice daily, placed on daily Maalox and recommend follow-up with PCP in interim while she arranges follow-up with GI.  Strict return precautions regarding recurrence or worsening of pain reinforced the patient expressed understanding.      Clinical Impression:  1. Abdominal pain, unspecified abdominal location   2. Acute gastritis without hemorrhage, unspecified gastritis type      Data Unavailable   Final Clinical Impression(s) / ED Diagnoses Final diagnoses:  Abdominal pain, unspecified abdominal location  Acute gastritis without hemorrhage, unspecified gastritis type    Rx / DC Orders ED Discharge Orders          Ordered    pantoprazole  (PROTONIX ) 40 MG tablet  2 times daily        07/05/24 0701    alum & mag hydroxide-simeth (MAALOX MAX) 400-400-40 MG/5ML suspension  Every 6 hours PRN        07/05/24 0701               Jerral Meth, MD 07/05/24 0701  "

## 2024-07-09 DIAGNOSIS — N3281 Overactive bladder: Secondary | ICD-10-CM | POA: Diagnosis not present

## 2024-07-09 DIAGNOSIS — R3589 Other polyuria: Secondary | ICD-10-CM | POA: Diagnosis not present

## 2024-07-09 DIAGNOSIS — R350 Frequency of micturition: Secondary | ICD-10-CM | POA: Diagnosis not present
